# Patient Record
Sex: Female | Born: 1955
Health system: Southern US, Community
[De-identification: ages and names within clinical notes are randomized; demographics above are authoritative.]

## PROBLEM LIST (undated history)

## (undated) DIAGNOSIS — I10 Essential (primary) hypertension: Secondary | ICD-10-CM

## (undated) DIAGNOSIS — J45909 Unspecified asthma, uncomplicated: Secondary | ICD-10-CM

## (undated) DIAGNOSIS — M1990 Unspecified osteoarthritis, unspecified site: Secondary | ICD-10-CM

## (undated) DIAGNOSIS — I639 Cerebral infarction, unspecified: Secondary | ICD-10-CM

## (undated) DIAGNOSIS — I499 Cardiac arrhythmia, unspecified: Secondary | ICD-10-CM

---

## 1982-11-30 HISTORY — PX: TUBAL LIGATION: SHX77

## 2006-11-30 DIAGNOSIS — I639 Cerebral infarction, unspecified: Secondary | ICD-10-CM

## 2006-11-30 HISTORY — DX: Cerebral infarction, unspecified: I63.9

## 2013-11-30 DIAGNOSIS — I499 Cardiac arrhythmia, unspecified: Secondary | ICD-10-CM

## 2013-11-30 HISTORY — DX: Cardiac arrhythmia, unspecified: I49.9

## 2014-07-27 ENCOUNTER — Encounter (HOSPITAL_COMMUNITY): Payer: Self-pay | Admitting: Emergency Medicine

## 2014-07-27 ENCOUNTER — Emergency Department (HOSPITAL_COMMUNITY): Payer: Self-pay

## 2014-07-27 ENCOUNTER — Emergency Department (HOSPITAL_COMMUNITY)
Admission: EM | Admit: 2014-07-27 | Discharge: 2014-07-27 | Disposition: A | Discharge status: Home / Self Care | Payer: Self-pay | Attending: Emergency Medicine | Admitting: Emergency Medicine

## 2014-07-27 DIAGNOSIS — J069 Acute upper respiratory infection, unspecified: Secondary | ICD-10-CM | POA: Insufficient documentation

## 2014-07-27 DIAGNOSIS — Z8739 Personal history of other diseases of the musculoskeletal system and connective tissue: Secondary | ICD-10-CM | POA: Insufficient documentation

## 2014-07-27 DIAGNOSIS — I10 Essential (primary) hypertension: Secondary | ICD-10-CM | POA: Insufficient documentation

## 2014-07-27 DIAGNOSIS — R079 Chest pain, unspecified: Secondary | ICD-10-CM | POA: Insufficient documentation

## 2014-07-27 DIAGNOSIS — R0602 Shortness of breath: Secondary | ICD-10-CM | POA: Insufficient documentation

## 2014-07-27 HISTORY — DX: Essential (primary) hypertension: I10

## 2014-07-27 HISTORY — DX: Unspecified osteoarthritis, unspecified site: M19.90

## 2014-07-27 LAB — BASIC METABOLIC PANEL
Anion gap: 10 (ref 5–15)
BUN: 12 mg/dL (ref 6–23)
CHLORIDE: 103 meq/L (ref 96–112)
CO2: 27 meq/L (ref 19–32)
CREATININE: 0.66 mg/dL (ref 0.50–1.10)
Calcium: 8.8 mg/dL (ref 8.4–10.5)
GFR calc Af Amer: >90 mL/min (ref >90)
GFR calc non Af Amer: >90 mL/min (ref >90)
Glucose, Bld: 96 mg/dL (ref 70–99)
Potassium: 3.7 mEq/L (ref 3.7–5.3)
Sodium: 140 mEq/L (ref 137–147)

## 2014-07-27 LAB — CBC
HEMATOCRIT: 40.1 % (ref 36.0–46.0)
Hemoglobin: 13 g/dL (ref 12.0–15.0)
MCH: 27 pg (ref 26.0–34.0)
MCHC: 32.4 g/dL (ref 30.0–36.0)
MCV: 83.2 fL (ref 78.0–100.0)
Platelets: 259 10*3/uL (ref 150–400)
RBC: 4.82 MIL/uL (ref 3.87–5.11)
RDW: 14.1 % (ref 11.5–15.5)
WBC: 6.4 10*3/uL (ref 4.0–10.5)

## 2014-07-27 LAB — URINE MICROSCOPIC-ADD ON

## 2014-07-27 LAB — URINALYSIS, ROUTINE W REFLEX MICROSCOPIC
BILIRUBIN URINE: NEGATIVE
Glucose, UA: NEGATIVE mg/dL
HGB URINE DIPSTICK: NEGATIVE
Ketones, ur: NEGATIVE mg/dL
Nitrite: NEGATIVE
Protein, ur: NEGATIVE mg/dL
SPECIFIC GRAVITY, URINE: 1.025 (ref 1.005–1.030)
Urobilinogen, UA: 1 mg/dL (ref 0.0–1.0)
pH: 6 (ref 5.0–8.0)

## 2014-07-27 LAB — TROPONIN I: Troponin I: <0.3 ng/mL (ref <0.30)

## 2014-07-27 MED ORDER — SODIUM CHLORIDE 0.9 % IV SOLN
Freq: Once | INTRAVENOUS | Status: AC
  Administered 2014-07-27: 08:00:00 via INTRAVENOUS

## 2014-07-27 NOTE — Discharge Instructions (Signed)
Keep up your fluid intake at home. Continue using your amlodipine (blood pressure medication) as prescribed.  Upper Respiratory Infection, Adult An upper respiratory infection (URI) is also sometimes known as the common cold. The upper respiratory tract includes the nose, sinuses, throat, trachea, and bronchi. Bronchi are the airways leading to the lungs. Most people improve within 1 week, but symptoms can last up to 2 weeks. A residual cough may last even longer.  CAUSES Many different viruses can infect the tissues lining the upper respiratory tract. The tissues become irritated and inflamed and often become very moist. Mucus production is also common. A cold is contagious. You can easily spread the virus to others by oral contact. This includes kissing, sharing a glass, coughing, or sneezing. Touching your mouth or nose and then touching a surface, which is then touched by another person, can also spread the virus. SYMPTOMS  Symptoms typically develop 1 to 3 days after you come in contact with a cold virus. Symptoms vary from person to person. They may include:  Runny nose.  Sneezing.  Nasal congestion.  Sinus irritation.  Sore throat.  Loss of voice (laryngitis).  Cough.  Fatigue.  Muscle aches.  Loss of appetite.  Headache.  Low-grade fever. DIAGNOSIS  You might diagnose your own cold based on familiar symptoms, since most people get a cold 2 to 3 times a year. Your caregiver can confirm this based on your exam. Most importantly, your caregiver can check that your symptoms are not due to another disease such as strep throat, sinusitis, pneumonia, asthma, or epiglottitis. Blood tests, throat tests, and X-rays are not necessary to diagnose a common cold, but they may sometimes be helpful in excluding other more serious diseases. Your caregiver will decide if any further tests are required. RISKS AND COMPLICATIONS  You may be at risk for a more severe case of the common cold if  you smoke cigarettes, have chronic heart disease (such as heart failure) or lung disease (such as asthma), or if you have a weakened immune system. The very young and very old are also at risk for more serious infections. Bacterial sinusitis, middle ear infections, and bacterial pneumonia can complicate the common cold. The common cold can worsen asthma and chronic obstructive pulmonary disease (COPD). Sometimes, these complications can require emergency medical care and may be life-threatening. PREVENTION  The best way to protect against getting a cold is to practice good hygiene. Avoid oral or hand contact with people with cold symptoms. Wash your hands often if contact occurs. There is no clear evidence that vitamin C, vitamin E, echinacea, or exercise reduces the chance of developing a cold. However, it is always recommended to get plenty of rest and practice good nutrition. TREATMENT  Treatment is directed at relieving symptoms. There is no cure. Antibiotics are not effective, because the infection is caused by a virus, not by bacteria. Treatment may include:  Increased fluid intake. Sports drinks offer valuable electrolytes, sugars, and fluids.  Breathing heated mist or steam (vaporizer or shower).  Eating chicken soup or other clear broths, and maintaining good nutrition.  Getting plenty of rest.  Using gargles or lozenges for comfort.  Controlling fevers with ibuprofen or acetaminophen as directed by your caregiver.  Increasing usage of your inhaler if you have asthma. Zinc gel and zinc lozenges, taken in the first 24 hours of the common cold, can shorten the duration and lessen the severity of symptoms. Pain medicines may help with fever, muscle aches, and  throat pain. A variety of non-prescription medicines are available to treat congestion and runny nose. Your caregiver can make recommendations and may suggest nasal or lung inhalers for other symptoms.  HOME CARE INSTRUCTIONS   Only  take over-the-counter or prescription medicines for pain, discomfort, or fever as directed by your caregiver.  Use a warm mist humidifier or inhale steam from a shower to increase air moisture. This may keep secretions moist and make it easier to breathe.  Drink enough water and fluids to keep your urine clear or pale yellow.  Rest as needed.  Return to work when your temperature has returned to normal or as your caregiver advises. You may need to stay home longer to avoid infecting others. You can also use a face mask and careful hand washing to prevent spread of the virus. SEEK MEDICAL CARE IF:   After the first few days, you feel you are getting worse rather than better.  You need your caregiver's advice about medicines to control symptoms.  You develop chills, worsening shortness of breath, or brown or red sputum. These may be signs of pneumonia.  You develop yellow or brown nasal discharge or pain in the face, especially when you bend forward. These may be signs of sinusitis.  You develop a fever, swollen neck glands, pain with swallowing, or white areas in the back of your throat. These may be signs of strep throat. SEEK IMMEDIATE MEDICAL CARE IF:   You have a fever.  You develop severe or persistent headache, ear pain, sinus pain, or chest pain.  You develop wheezing, a prolonged cough, cough up blood, or have a change in your usual mucus (if you have chronic lung disease).  You develop sore muscles or a stiff neck. Document Released: 05/12/2001 Document Revised: 02/08/2012 Document Reviewed: 02/21/2014 [employer] [facility] Patient Information 2015 [place], [place]. This information is not intended to replace advice given to you by your health care provider. Make sure you discuss any questions you have with your health care provider.

## 2014-07-27 NOTE — ED Provider Notes (Signed)
CSN: [id number]     Arrival date & time 07/27/14  0654 History   First MD Initiated Contact with Patient 07/27/14 [phone]     Chief Complaint  Patient presents with  . Chest Pain  . Shortness of Breath   Patient is a 58 y.o. female presenting with chest pain and shortness of breath.  Chest Pain Associated symptoms: shortness of breath   Shortness of Breath Associated symptoms: chest pain    Stephanie Snyder is a 55 yo woman with a history of asthma, hypertension, ?heart arrythmia and ?clots who is presenting with congestion, chills, shortness of breath and headache for the past 5 days. She also has longstanding left chest pain brought on by stress that reaches 8/10 in intensity and is somewhat relieved by sitting up. She also reports a history of pneumonia, as recently as one year ago.  Past Medical History  Diagnosis Date  . Arthritis   . Hypertension    History reviewed. No pertinent past surgical history. History reviewed. No pertinent family history. History  Substance Use Topics  . Smoking status: Never Smoker   . Smokeless tobacco: Not on file  . Alcohol Use: No   OB History   Grav Para Term Preterm Abortions TAB SAB Ect Mult Living                 Review of Systems  Respiratory: Positive for shortness of breath.   Cardiovascular: Positive for chest pain.  General: no recent illness prior to this episode; reports pneumonia every year following URI Skin: no rashes or lesions HEENT: generalized headache, no vision or hearing changes  Cardiac: chest pain as described in HPI, history of palpitations Respiratory: shortness of breath as described in HPI GI: no changes in BM, no abdominal pain Urinary: no dysuria, hematuria, frequency Msk: longstanding right knee swelling and pain  Allergies  Review of patient's allergies indicates not on file.  Home Medications   Prior to Admission medications   Not on File  amlodipine (given yesterday) Albuterol inhaler (prescribed  yesterday)  BP 157/92  Pulse 88  Temp(Src) 97.7 F (36.5 C) (Oral)  Resp 17  Ht  (1.575 m)  Wt 62 lb (28.123 kg)  BMI 11.34 kg/m2  SpO2 98% Physical Exam Appearance: in NAD HEENT: congested, head is AT/[place]ber[place], moist mucous membranes, PERRL, EOMi Heart: tachycardic,  Lungs: CTAB, no wheezing Abdomen: BS+, soft, nontender  Musculoskeletal: right knee swelling Extremities: lower extremities otherwise nonedematous Neurologic: A&Ox3, sensation and strength intact throughout Skin: no rashes or lesions   ED Course  Procedures (including critical care time) Labs Review Labs Reviewed  URINALYSIS, ROUTINE W REFLEX MICROSCOPIC - Abnormal; Notable for the following:    Leukocytes, UA SMALL (*)    All other components within normal limits  URINE MICROSCOPIC-ADD ON - Abnormal; Notable for the following:    Squamous Epithelial / LPF FEW (*)    Bacteria, UA FEW (*)    All other components within normal limits  TROPONIN I  CBC  BASIC METABOLIC PANEL  troponin <0.3  Imaging Review Dg Chest 2 View  07/27/2014   CLINICAL DATA:  Left chest pain shortness of breath productive cough nasal congestion  EXAM: CHEST  2 VIEW  COMPARISON:  None.  FINDINGS: Heart size upper normal. Vascular pattern is normal. Lungs are clear except for minimal scarring or atelectasis in left lower lobe. No pleural effusions.  IMPRESSION: No active cardiopulmonary disease.   Electronically Signed   By: Esperanza Heir  M.D.   On: 07/27/2014 09:39     EKG Interpretation   Date/Time:  Friday July 27 2014 07:00:42 EDT Ventricular Rate:  101 PR Interval:  142 QRS Duration: 76 QT Interval:  372 QTC Calculation: 482 R Axis:   78 Text Interpretation:  Sinus tachycardia Minimal voltage criteria for LVH,  may be normal variant Borderline ECG Sinus tachycardia Left ventricular  hypertrophy Borderline ECG Confirmed by Gerhard Munch  MD ([id number]) on  07/27/2014 7:59:17 AM      MDM   Final diagnoses:  URI (upper  respiratory infection)    This 58 yo woman with congestion, shortness of breath, headache and chills for the past 5 days has unremarkable labs (including troponin) and an unremarkable EKG and chest xray. She likely is experiencing a URI. She was given slow infusion IVF and is feeling better. In fact, she requested discharge, since "her boyfriend is hungry". She will be discharged and encouraged to maintain good fluid intake at home.    Dionne Ano, MD 07/27/14 1113  Dionne Ano, MD 07/27/14 [phone]

## 2014-07-27 NOTE — ED Notes (Signed)
Pt here for chest pain, and hx of pna, sts feels the same. Also complaints of arthritis type complaints.

## 2014-07-27 NOTE — ED Notes (Signed)
Patient transported to X-ray 

## 2014-07-27 NOTE — ED Provider Notes (Signed)
This patient was seen in conjunction with the resident physician, Dr. Leatha Gilding.  The documentation accurately reflects the ED encounter.  On my exam, the patient was sitting upright, in no distress, speaking clearly.  Vital signs were unremarkable. His EKG showed ventricular rate 101, tachycardia, LVH, otherwise unremarkable. The patient presents here, and requested discharge.  The reassuring findings, this seemed reasonable.  Gerhard Munch, MD 07/27/14 [phone]

## 2014-07-31 NOTE — Discharge Planning (Signed)
[facility] Liaison was not able to see the pt, [facility] orange card information will be sent to the address provided. °

## 2014-09-07 ENCOUNTER — Encounter (HOSPITAL_COMMUNITY): Payer: Self-pay | Admitting: Emergency Medicine

## 2014-09-07 ENCOUNTER — Emergency Department (HOSPITAL_COMMUNITY): Payer: Self-pay

## 2014-09-07 ENCOUNTER — Emergency Department (HOSPITAL_COMMUNITY)
Admission: EM | Admit: 2014-09-07 | Discharge: 2014-09-07 | Disposition: A | Discharge status: Home / Self Care | Payer: Self-pay | Attending: Emergency Medicine | Admitting: Emergency Medicine

## 2014-09-07 DIAGNOSIS — Z79899 Other long term (current) drug therapy: Secondary | ICD-10-CM | POA: Insufficient documentation

## 2014-09-07 DIAGNOSIS — Z91040 Latex allergy status: Secondary | ICD-10-CM | POA: Insufficient documentation

## 2014-09-07 DIAGNOSIS — M6281 Muscle weakness (generalized): Secondary | ICD-10-CM | POA: Insufficient documentation

## 2014-09-07 DIAGNOSIS — R531 Weakness: Secondary | ICD-10-CM

## 2014-09-07 DIAGNOSIS — I10 Essential (primary) hypertension: Secondary | ICD-10-CM | POA: Insufficient documentation

## 2014-09-07 LAB — CBC WITH DIFFERENTIAL/PLATELET
BASOS ABS: 0 10*3/uL (ref 0.0–0.1)
Basophils Relative: 1 % (ref 0–1)
EOS ABS: 0.2 10*3/uL (ref 0.0–0.7)
Eosinophils Relative: 4 % (ref 0–5)
HCT: 37.7 % (ref 36.0–46.0)
Hemoglobin: 12.3 g/dL (ref 12.0–15.0)
LYMPHS ABS: 2 10*3/uL (ref 0.7–4.0)
Lymphocytes Relative: 30 % (ref 12–46)
MCH: 26.6 pg (ref 26.0–34.0)
MCHC: 32.6 g/dL (ref 30.0–36.0)
MCV: 81.4 fL (ref 78.0–100.0)
Monocytes Absolute: 0.7 10*3/uL (ref 0.1–1.0)
Monocytes Relative: 11 % (ref 3–12)
NEUTROS PCT: 54 % (ref 43–77)
Neutro Abs: 3.6 10*3/uL (ref 1.7–7.7)
Platelets: 272 10*3/uL (ref 150–400)
RBC: 4.63 MIL/uL (ref 3.87–5.11)
RDW: 14.7 % (ref 11.5–15.5)
WBC: 6.6 10*3/uL (ref 4.0–10.5)

## 2014-09-07 LAB — BASIC METABOLIC PANEL
Anion gap: 12 (ref 5–15)
BUN: 23 mg/dL (ref 6–23)
CALCIUM: 8.9 mg/dL (ref 8.4–10.5)
CO2: 25 mEq/L (ref 19–32)
Chloride: 105 mEq/L (ref 96–112)
Creatinine, Ser: 0.73 mg/dL (ref 0.50–1.10)
GLUCOSE: 105 mg/dL — AB (ref 70–99)
POTASSIUM: 4 meq/L (ref 3.7–5.3)
SODIUM: 142 meq/L (ref 137–147)

## 2014-09-07 LAB — URINALYSIS, ROUTINE W REFLEX MICROSCOPIC
Bilirubin Urine: NEGATIVE
Glucose, UA: NEGATIVE mg/dL
Hgb urine dipstick: NEGATIVE
KETONES UR: NEGATIVE mg/dL
NITRITE: NEGATIVE
Protein, ur: NEGATIVE mg/dL
Specific Gravity, Urine: 1.025 (ref 1.005–1.030)
UROBILINOGEN UA: 1 mg/dL (ref 0.0–1.0)
pH: 6 (ref 5.0–8.0)

## 2014-09-07 LAB — URINE MICROSCOPIC-ADD ON

## 2014-09-07 NOTE — ED Provider Notes (Signed)
CSN: [id number]     Arrival date & time 09/07/14  4098 History   First MD Initiated Contact with Patient 09/07/14 [phone]     Chief Complaint  Patient presents with  . Leg Pain     (Consider location/radiation/quality/duration/timing/severity/associated sxs/prior Treatment) HPI Comments: Patient with past history of arthritis, high blood pressure -- from a shelter with complaint of weakness. Patient states that she has some weakness in her legs at baseline, especially her right leg. For this she uses a cane to help her ambulate. She will also have a numb sensation in her hands and legs which is intermittent and has occurred in the past, more frequently as of late. It occurs on both sides of her body. Today while preparing to leave the shelter, patient became very weak over her entire body. She was assisted by staff. The symptoms lasted for a short period of time (seconds to minutes). Patient denies signs of stroke including: facial droop, slurred speech, aphasia, imbalance/trouble walking. She did c/o her usual bilateral extremity weakness and numbness. No fevers, URI symptoms, chest pain, shortness of breath, abdominal pain, nausea, vomiting, diarrhea, urinary symptoms. No trauma or injury. No history of stroke or TIA.   Patient is a 58 y.o. female presenting with leg pain. The history is provided by the patient, medical records and the spouse.  Leg Pain Associated symptoms: no back pain and no fever     Past Medical History  Diagnosis Date  . Arthritis   . Hypertension    History reviewed. No pertinent past surgical history. No family history on file. History  Substance Use Topics  . Smoking status: Never Smoker   . Smokeless tobacco: Not on file  . Alcohol Use: No   OB History   Grav Para Term Preterm Abortions TAB SAB Ect Mult Living                 Review of Systems  Constitutional: Negative for fever.  HENT: Negative for rhinorrhea and sore throat.   Eyes: Negative for  redness.  Respiratory: Negative for cough.   Cardiovascular: Negative for chest pain.  Gastrointestinal: Negative for nausea, vomiting, abdominal pain and diarrhea.  Genitourinary: Negative for dysuria.  Musculoskeletal: Positive for myalgias. Negative for back pain.  Skin: Negative for rash.  Neurological: Positive for weakness and numbness. Negative for dizziness, seizures, syncope, facial asymmetry, speech difficulty, light-headedness and headaches.    Allergies  Latex  Home Medications   Prior to Admission medications   Medication Sig Start Date End Date Taking? Authorizing Provider  amLODipine (NORVASC) 10 MG tablet Take 10 mg by mouth daily.   Yes Historical Provider, MD  hydrochlorothiazide (HYDRODIURIL) 25 MG tablet Take 25 mg by mouth daily.   Yes Historical Provider, MD  metoprolol tartrate (LOPRESSOR) 25 MG tablet Take 25 mg by mouth daily.   Yes Historical Provider, MD  albuterol (PROVENTIL HFA;VENTOLIN HFA) 108 (90 BASE) MCG/ACT inhaler Inhale 2 puffs into the lungs every 6 (six) hours as needed for wheezing or shortness of breath.    Historical Provider, MD   BP 136/76  Pulse 71  Temp(Src) 97.9 F (36.6 C) (Oral)  Resp 18  Ht 5\' 2"  (1.575 m)  Wt 160 lb (72.576 kg)  BMI 29.26 kg/m2  SpO2 99%  Physical Exam  Nursing note and vitals reviewed. Constitutional: She is oriented to person, place, and time. She appears well-developed and well-nourished.  HENT:  Head: Normocephalic and atraumatic.  Right Ear: Tympanic membrane, external ear  and ear canal normal.  Left Ear: Tympanic membrane, external ear and ear canal normal.  Nose: Nose normal.  Mouth/Throat: Uvula is midline, oropharynx is clear and moist and mucous membranes are normal.  Eyes: Conjunctivae, EOM and lids are normal. Pupils are equal, round, and reactive to light. Right eye exhibits no nystagmus. Left eye exhibits no nystagmus.  Neck: Normal range of motion. Neck supple.  Cardiovascular: Normal rate  and regular rhythm.   Pulmonary/Chest: Effort normal and breath sounds normal.  Abdominal: Soft. There is no tenderness.  Musculoskeletal:       Cervical back: She exhibits normal range of motion, no tenderness and no bony tenderness.  Neurological: She is alert and oriented to person, place, and time. She has normal strength and normal reflexes. No cranial nerve deficit or sensory deficit. She exhibits normal muscle tone. She displays a negative Romberg sign. Coordination and gait normal. GCS eye subscore is 4. GCS verbal subscore is 5. GCS motor subscore is 6.  Patient states that her bilateral hands and feet are completely numb. On sharp/dull testing, the patient yells out when sharp tested on ankles, feet, forearms, wrists, and toes. She has apparently normal sensation in all extremities. No truncal ataxia. She ambulates at her baseline.   Skin: Skin is warm and dry.  Psychiatric: She has a normal mood and affect.    ED Course  Procedures (including critical care time) Labs Review Labs Reviewed  BASIC METABOLIC PANEL - Abnormal; Notable for the following:    Glucose, Bld 105 (*)    All other components within normal limits  URINALYSIS, ROUTINE W REFLEX MICROSCOPIC - Abnormal; Notable for the following:    APPearance CLOUDY (*)    Leukocytes, UA TRACE (*)    All other components within normal limits  URINE MICROSCOPIC-ADD ON - Abnormal; Notable for the following:    Squamous Epithelial / LPF FEW (*)    Bacteria, UA FEW (*)    All other components within normal limits  CBC WITH DIFFERENTIAL    Imaging Review Ct Head Wo Contrast  09/07/2014   CLINICAL DATA:  Initial encounter for intermittent headaches  EXAM: CT HEAD WITHOUT CONTRAST  TECHNIQUE: Contiguous axial images were obtained from the base of the skull through the vertex without intravenous contrast.  COMPARISON:  None.  FINDINGS: There is no evidence for acute hemorrhage, hydrocephalus, mass lesion, or abnormal extra-axial  fluid collection. No definite CT evidence for acute infarction. Patchy low attenuation in the deep hemispheric and periventricular white matter is nonspecific, but likely reflects chronic microvascular ischemic demyelination. Subtle irregularity associated with the inner table of the high left frontoparietal skull. No definable mass lesion or associated parenchymal calcification.  The visualized paranasal sinuses and mastoid air cells are clear.  IMPRESSION: No acute intracranial abnormality.  Chronic microvascular ischemic demyelination.   Electronically Signed   By: Kennith CenterEric  Mansell M.D.   On: 09/07/2014 08:01     EKG Interpretation   Date/Time:  Friday September 07 2014 07:49:19 EDT Ventricular Rate:  65 PR Interval:  168 QRS Duration: 69 QT Interval:  430 QTC Calculation: 447 R Axis:   60 Text Interpretation:  Sinus rhythm Confirmed by Fayrene FearingJAMES  MD, MARK ([id number]) on  09/07/2014 8:06:38 AM      7:16 AM Patient seen and examined. Work-up initiated. I am not concerned for acute stroke given presentation and I doubt TIA. Exam is not consistent with patient's complaints.   Vital signs reviewed and are as follows: BP 136/76  Pulse 71  Temp(Src) 97.9 F (36.6 C) (Oral)  Resp 18  Ht 5\' 2"  (1.575 m)  Wt 160 lb (72.576 kg)  BMI 29.26 kg/m2  SpO2 99%  Patient's history, exam, and findings discussed with Dr. Fayrene FearingJames. Patient's exam is stable while in ED.   Patient and husband informed of all results.   Patient counseled to return if they have weakness in their arms or legs, slurred speech, trouble walking or talking, confusion, trouble with their balance, or if they have any other concerns. Patient verbalizes understanding and agrees with plan.   Encouraged PCP f/u.    MDM   Final diagnoses:  Generalized weakness   Patient with generalized weakness. As documented, her exam is not consistent with her complaints. Her EKG is unremarkable. CT head is negative for acute process. Labs  unremarkable. No neurological deficits on exam. She ambulates at baseline. No unilateral weakness. I do not suspect that this patient had a stroke. Furthermore, I doubt that her constellation of symptoms today represent TIA. Patient seems most concerned about needing a note so that she does not have to leave her shelter during the day. She is cleared to go home at this time. We discussed appropriate return instructions at bedside per above.     Renne CriglerJoshua Tehillah Cipriani, PA-C 09/07/14 1115

## 2014-09-07 NOTE — ED Notes (Signed)
Bed: WA06 Expected date:  Expected time:  Means of arrival:  Comments[facility]

## 2014-09-07 NOTE — ED Notes (Signed)
[facility]. Reports upon standing leg pain is "dull" 9/10 pain. Denies recent injury to same. Denies radiating pain.   VS BP 155/79, HR 86, 02 100% RA.

## 2014-09-07 NOTE — Discharge Instructions (Signed)
Please read and follow all provided instructions.  Your diagnoses today include:  1. Generalized weakness    Tests performed today include:  Blood counts and electrolytes  CT of your head - no signs of stroke  Urine test - no signs of infection  Vital signs. See below for your results today.   Medications prescribed:   None  Take any prescribed medications only as directed.  Home care instructions:  Follow any educational materials contained in this packet.  Follow-up instructions: Please follow-up with your primary care provider in the next 3 days for further evaluation of your symptoms. Contact the referral given if you do not have a primary care doctor.   Return instructions:   Please return to the Emergency Department if you experience worsening symptoms.  Return if you have worsening weakness in your arms or legs, slurred speech, trouble walking or talking, confusion, or trouble with your balance.   Please return if you have any other emergent concerns.  Additional Information:  Your vital signs today were: BP 136/76   Pulse 71   Temp(Src) 97.9 F (36.6 C) (Oral)   Resp 18   Ht 5\' 2"  (1.575 m)   Wt 160 lb (72.576 kg)   BMI 29.26 kg/m2   SpO2 99% If your blood pressure (BP) was elevated above 135/85 this visit, please have this repeated by your doctor within one month. --------------

## 2014-09-07 NOTE — ED Notes (Signed)
Patient reports intermittent numbness from bilateral feet to upper bilateral ankles for several months. Patient reports left hand numbness a few days ago. Patient reports right leg pressure, pain 9/10. Denies N/V, lightheadedness, dizziness.

## 2014-09-14 NOTE — ED Provider Notes (Signed)
Medical screening examination/treatment/procedure(s) were performed by non-physician practitioner and as supervising physician I was immediately available for consultation/collaboration.   EKG Interpretation   Date/Time:  Friday September 07 2014 07:49:19 EDT Ventricular Rate:  65 PR Interval:  168 QRS Duration: 69 QT Interval:  430 QTC Calculation: 447 R Axis:   60 Text Interpretation:  Sinus rhythm Confirmed by Fayrene FearingJAMES  MD, Lunna Vogelgesang ([id number]) on  09/07/2014 8:06:38 AM        Rolland PorterMark Vernessa Likes, MD 09/14/14 [phone]

## 2014-12-05 ENCOUNTER — Encounter (HOSPITAL_COMMUNITY): Payer: Self-pay | Admitting: *Deleted

## 2014-12-05 ENCOUNTER — Emergency Department (INDEPENDENT_AMBULATORY_CARE_PROVIDER_SITE_OTHER)
Admission: EM | Admit: 2014-12-05 | Discharge: 2014-12-05 | Disposition: A | Discharge status: Home / Self Care | Payer: Self-pay | Source: Home / Self Care | Attending: Family Medicine | Admitting: Family Medicine

## 2014-12-05 DIAGNOSIS — I10 Essential (primary) hypertension: Secondary | ICD-10-CM

## 2014-12-05 HISTORY — DX: Unspecified asthma, uncomplicated: J45.909

## 2014-12-05 HISTORY — DX: Cardiac arrhythmia, unspecified: I49.9

## 2014-12-05 HISTORY — DX: Cerebral infarction, unspecified: I63.9

## 2014-12-05 MED ORDER — AMLODIPINE BESYLATE 10 MG PO TABS: 10.0000 mg | ORAL_TABLET | Freq: Once | ORAL | Status: DC

## 2014-12-05 MED ORDER — METOPROLOL TARTRATE 50 MG PO TABS
50.0000 mg | ORAL_TABLET | Freq: Once | ORAL | Status: AC
  Administered 2014-12-05: 50 mg via ORAL
  Filled 2014-12-05: qty 1

## 2014-12-05 MED ORDER — AMLODIPINE BESYLATE 10 MG PO TABS
10.0000 mg | ORAL_TABLET | Freq: Once | ORAL | Status: AC
  Administered 2014-12-05: 10 mg via ORAL
  Filled 2014-12-05: qty 1

## 2014-12-05 MED ORDER — METOPROLOL TARTRATE 50 MG PO TABS: 50.0000 mg | ORAL_TABLET | Freq: Once | ORAL | Status: DC

## 2014-12-05 MED ORDER — HYDROCHLOROTHIAZIDE 25 MG PO TABS: 25.0000 mg | ORAL_TABLET | Freq: Every day | ORAL | Status: DC

## 2014-12-05 MED ORDER — AMLODIPINE BESYLATE 10 MG PO TABS: 10.0000 mg | ORAL_TABLET | Freq: Every day | ORAL | Status: DC

## 2014-12-05 MED ORDER — METOPROLOL TARTRATE 50 MG PO TABS: 50.0000 mg | ORAL_TABLET | Freq: Two times a day (BID) | ORAL | Status: DC

## 2014-12-05 NOTE — ED Notes (Signed)
Pt. asked for note to stay in the center during the day.  They are put out from 0900-1500 every day to walk.  Discussed with PA and she said she would do one day.  Note done by PA and given to pt.

## 2014-12-05 NOTE — ED Provider Notes (Signed)
CSN: [id number]     Arrival date & time 12/05/14  1726 History   First MD Initiated Contact with Patient 12/05/14 1743     Chief Complaint  Patient presents with  . Hypertension   (Consider location/radiation/quality/duration/timing/severity/associated sxs/prior Treatment) HPI Comments: Patient states she ran out of her antihypertension medications 2 weeks ago and comes to our clinic today to request prescription refills. Reports she is currently living in a shelter and clinic that she used to go to has closed and she is awaiting transition to clinic at [facility].  Reports that Lopressor dose recently increased to 50 mg po BID. Also takes HCTZ 25 mg po QD and Norvasc 10 mg po QD.   Patient is a 59 y.o. female presenting with hypertension. The history is provided by the patient.  Hypertension This is a chronic problem.    Past Medical History  Diagnosis Date  . Arthritis   . Hypertension   . Asthma   . CVA (cerebral vascular accident) 2008  . Irregular heart beat 2015   Past Surgical History  Procedure Laterality Date  . Tubal ligation  1984   Family History  Problem Relation Age of Onset  . Cancer Father    History  Substance Use Topics  . Smoking status: Never Smoker   . Smokeless tobacco: Not on file  . Alcohol Use: No   OB History    No data available     Review of Systems  All other systems reviewed and are negative.   Allergies  Latex  Home Medications   Prior to Admission medications   Medication Sig Start Date End Date Taking? Authorizing Provider  albuterol (PROVENTIL HFA;VENTOLIN HFA) 108 (90 BASE) MCG/ACT inhaler Inhale 2 puffs into the lungs every 6 (six) hours as needed for wheezing or shortness of breath.   Yes Historical Provider, MD  amLODipine (NORVASC) 10 MG tablet Take 1 tablet (10 mg total) by mouth daily. 12/05/14   Mathis Fare Oma Alpert, PA  hydrochlorothiazide (HYDRODIURIL) 25 MG tablet Take 1 tablet (25 mg total) by  mouth daily. 12/05/14   Mathis Fare Shawny Borkowski, PA  metoprolol (LOPRESSOR) 50 MG tablet Take 1 tablet (50 mg total) by mouth 2 (two) times daily. 12/05/14   Jess Barters H Anagha Loseke, PA   BP 220/124 mmHg  Pulse 75  Temp(Src) 98.4 F (36.9 C) (Oral)  Resp 18  SpO2 98% Physical Exam  Constitutional: She is oriented to person, place, and time. She appears well-developed and well-nourished. No distress.  +overweight  HENT:  Head: Normocephalic and atraumatic.  Eyes: Conjunctivae are normal. No scleral icterus.  Cardiovascular: Normal rate, regular rhythm and normal heart sounds.   Pulmonary/Chest: Effort normal and breath sounds normal.  Musculoskeletal: Normal range of motion.  Neurological: She is alert and oriented to person, place, and time.  Skin: Skin is warm and dry.  Psychiatric: She has a normal mood and affect. Her behavior is normal.  Nursing note and vitals reviewed.   ED Course  Procedures (including critical care time) Labs Review Labs Reviewed - No data to display  Imaging Review No results found.   MDM   1. Essential hypertension    Contacted RN at shelter and she stated she would transport patient to have medications filled tomorrow morning. As patient's BP is currently elevated, I contacted [facility] and ordered 50 mg dose of lopressor and 10 mg dose of Norvasc to take tonight prior to discharge from  [facility] clinic. Courier to pick up meds and we will administer prior to discharge.    Ria ClockJennifer Lee H Jaala Bohle, [place] 12/05/14 [phone]

## 2014-12-05 NOTE — ED Notes (Addendum)
Homeless and [facility].  Her husband is veteran and they are trying to get home.  Pt. Has been out of BP meds  2 weeks.  No PCP.  Also c/o pain and swelling to R knee.  C/o numbness and tingling in R thigh when she walks.

## 2014-12-05 NOTE — Discharge Instructions (Signed)

## 2014-12-05 NOTE — ED Notes (Signed)
Pharmacy called to get the first 2 doses of her BP medication because she can't get the Rx.'s filled until tomorrow.  Pt. to wait in waiting room for medication.

## 2016-02-14 ENCOUNTER — Ambulatory Visit (INDEPENDENT_AMBULATORY_CARE_PROVIDER_SITE_OTHER): Payer: Medicaid Other | Admitting: Internal Medicine

## 2016-02-14 ENCOUNTER — Encounter: Payer: Self-pay | Admitting: Internal Medicine

## 2016-02-14 ENCOUNTER — Telehealth: Payer: Self-pay | Admitting: *Deleted

## 2016-02-14 VITALS — BP 186/106 | HR 81 | Temp 97.4°F | Ht 60.0 in | Wt 170.8 lb

## 2016-02-14 DIAGNOSIS — I10 Essential (primary) hypertension: Secondary | ICD-10-CM | POA: Insufficient documentation

## 2016-02-14 DIAGNOSIS — J45909 Unspecified asthma, uncomplicated: Secondary | ICD-10-CM

## 2016-02-14 MED ORDER — LISINOPRIL 10 MG PO TABS: 10.0000 mg | ORAL_TABLET | Freq: Every day | ORAL | Status: DC

## 2016-02-14 MED ORDER — CETIRIZINE HCL 5 MG PO TABS: 5.0000 mg | ORAL_TABLET | Freq: Every day | ORAL | Status: DC

## 2016-02-14 MED ORDER — ALBUTEROL SULFATE HFA 108 (90 BASE) MCG/ACT IN AERS: 2.0000 | INHALATION_SPRAY | Freq: Four times a day (QID) | RESPIRATORY_TRACT | Status: DC | PRN

## 2016-02-14 NOTE — Assessment & Plan Note (Signed)
BP elevated in setting of not taking Clonidine for 2-3 days.  She is currently on Amlodipine, HCTZ and Metoprolol.   A/P: HTN requiring 4 drugs.  Will stop Clonidine and start Lisinopril.  Recheck BP in 2 weeks.  Investigation of secondary causes of HTN should occur when patient not in acute asthma exacerbation. - BMP - Continue Amlodipine, HCTZ, and Metoprolol - Lisinopril 10 mg daily - RTC 2 weeks

## 2016-02-14 NOTE — Patient Instructions (Signed)
1. STOP taking Clonidine. 2. Start Lisinopril 10 mg (1 tab) once daily. 3. Use Albuterol 2 puffs every 4 hours as needed shortness of breath.  Use with spacer. 4. Return to clinic in 2 weeks.

## 2016-02-14 NOTE — Progress Notes (Signed)
Patient ID: Stephanie Snyder, female   DOB: 02/19/1956, 60 y.o.   MRN: [id number]   Subjective:   Patient ID: Stephanie Snyder female   DOB: 07/18/1956 60 y.o.   MRN: [id number]  HPI: [name] is a 60 y.o. female with PMH as below, here for eval asthma and HTN.  Please see Problem-Based charting for the status of the patient's chronic medical issues.  Patient reports h/o asthma, diagnosed 6 months ago. She states she had PFTs done for disability paperwork, but cannot recall the doctor or facility where it was performed.  She is now complaining of dyspnea every night and nonproductive cough.  She is using albuterol inhaler twice daily with minimal relief.  She denies h/o allergies, but states the dry air from her furnace causes her to get SOB.  She endorses intermittent fever and chills, stating she is always hot.  She currently has runny nose and congestion for the last week.   She endorses left sided chest pain and SOB with exertion, relieved with rest.  She has never seen a cardiologist.  She is currently chest pain free.  Her BP is high today because she hasn't taken Clonidine in 2-3 days.   Past Medical History  Diagnosis Date  . Arthritis   . Hypertension   . Asthma   . CVA (cerebral vascular accident) 2008  . Irregular heart beat 2015   Current Outpatient Prescriptions  Medication Sig Dispense Refill  . albuterol (PROVENTIL HFA;VENTOLIN HFA) 108 (90 Base) MCG/ACT inhaler Inhale 2 puffs into the lungs every 6 (six) hours as needed for wheezing or shortness of breath. 18 g 3  . amLODipine (NORVASC) 10 MG tablet Take 1 tablet (10 mg total) by mouth daily. 30 tablet 1  . cetirizine (ZYRTEC) 5 MG tablet Take 1 tablet (5 mg total) by mouth daily. 30 tablet 3  . hydrochlorothiazide (HYDRODIURIL) 25 MG tablet Take 1 tablet (25 mg total) by mouth daily. 30 tablet 1  . lisinopril (PRINIVIL,ZESTRIL) 10 MG tablet Take 1 tablet (10 mg total) by mouth daily. 30 tablet 3  . metoprolol  (LOPRESSOR) 50 MG tablet Take 1 tablet (50 mg total) by mouth 2 (two) times daily. 60 tablet 1   No current facility-administered medications for this visit.   Family History  Problem Relation Age of Onset  . Cancer Father    Social History   Social History  . Marital Status: Married    Spouse Name: N/A  . Number of Children: N/A  . Years of Education: N/A   Social History Main Topics  . Smoking status: Never Smoker   . Smokeless tobacco: Not on file  . Alcohol Use: No  . Drug Use: No  . Sexual Activity: Not on file   Other Topics Concern  . Not on file   Social History Narrative   Review of Systems: Pertinent items are noted in HPI. Objective:  Physical Exam: Filed Vitals:   02/14/16 1546  BP: 186/106  Pulse: 81  Temp: 97.4 F (36.3 C)  TempSrc: Oral  Height: 5' (1.524 m)  Weight: 170 lb 12.8 oz (77.474 kg)  SpO2: 98%   Physical Exam  Constitutional: She is oriented to person, place, and time and well-developed, well-nourished, and in no distress. No distress.  HENT:  Head: Normocephalic and atraumatic.  Eyes: EOM are normal. No scleral icterus.  Neck: No JVD present. No tracheal deviation present.  Cardiovascular: Normal rate, regular rhythm and normal heart sounds.   Pulmonary/Chest: Effort normal.  No stridor. No respiratory distress.  Minimal scattered wheezing  Abdominal: Soft. She exhibits no distension. There is no tenderness. There is no rebound and no guarding.  Neurological: She is alert and oriented to person, place, and time.  Skin: Skin is warm and dry. She is not diaphoretic.     Assessment & Plan:   Patient and case were discussed with Dr. Rogelia Boga.  Please refer to Problem Based charting for further documentation.

## 2016-02-14 NOTE — Assessment & Plan Note (Signed)
A/P: Presumed asthma exacerbation; patient likely mild persistent to moderate persistent.  Without PFTs to judge degree of constriction, I am hesitant to add more asthma control medications.  Acute symptoms due to either allergies from old house or URI.  Will refill Albuterol and increase frequency, and start Zyrtec for allergy control.  Recheck patient in 2 weeks and order PFTs at that time. - Albuterol 2 puffs q4h PRN - Zyrtec 5 mg daily

## 2016-02-15 LAB — BMP8+ANION GAP
ANION GAP: 17 mmol/L (ref 10.0–18.0)
BUN/Creatinine Ratio: 23 (ref 11–26)
BUN: 16 mg/dL (ref 8–27)
CALCIUM: 9.2 mg/dL (ref 8.7–10.3)
CO2: 21 mmol/L (ref 18–29)
CREATININE: 0.69 mg/dL (ref 0.57–1.00)
Chloride: 103 mmol/L (ref 96–106)
GFR calc Af Amer: 109 mL/min/{1.73_m2} (ref >59)
GFR, EST NON AFRICAN AMERICAN: 94 mL/min/{1.73_m2} (ref >59)
Glucose: 92 mg/dL (ref 65–99)
POTASSIUM: 3.7 mmol/L (ref 3.5–5.2)
Sodium: 141 mmol/L (ref 134–144)

## 2016-02-17 NOTE — Progress Notes (Signed)
Internal Medicine Clinic Attending  Case discussed with Dr. Taylor at the time of the visit.  We reviewed the resident's history and exam and pertinent patient test results.  I agree with the assessment, diagnosis, and plan of care documented in the resident's note. 

## 2016-02-19 NOTE — Addendum Note (Signed)
Addended by: Venia MinksAYLOR, Argenis Kumari A on: 02/19/2016 01:59 PM   Modules accepted: Level of Service

## 2016-02-28 ENCOUNTER — Ambulatory Visit (INDEPENDENT_AMBULATORY_CARE_PROVIDER_SITE_OTHER): Payer: Medicaid Other | Admitting: Internal Medicine

## 2016-02-28 ENCOUNTER — Encounter: Payer: Self-pay | Admitting: Internal Medicine

## 2016-02-28 VITALS — BP 140/74 | HR 67 | Temp 97.7°F | Ht 60.0 in | Wt 171.3 lb

## 2016-02-28 DIAGNOSIS — M25571 Pain in right ankle and joints of right foot: Secondary | ICD-10-CM

## 2016-02-28 DIAGNOSIS — Z79899 Other long term (current) drug therapy: Secondary | ICD-10-CM

## 2016-02-28 DIAGNOSIS — M79661 Pain in right lower leg: Secondary | ICD-10-CM

## 2016-02-28 DIAGNOSIS — I10 Essential (primary) hypertension: Secondary | ICD-10-CM | POA: Diagnosis not present

## 2016-02-28 DIAGNOSIS — Z7951 Long term (current) use of inhaled steroids: Secondary | ICD-10-CM | POA: Diagnosis not present

## 2016-02-28 DIAGNOSIS — M25561 Pain in right knee: Secondary | ICD-10-CM | POA: Diagnosis not present

## 2016-02-28 DIAGNOSIS — J4520 Mild intermittent asthma, uncomplicated: Secondary | ICD-10-CM | POA: Diagnosis not present

## 2016-02-28 NOTE — Assessment & Plan Note (Signed)
Patient reports jumping out of an attic 2 years ago, landing on her feet and crumpling to the ground.  She did not seek medical attention at that time.  Since then, she has been having slowly worsening right leg pain, with sharp, shooting pains proceeding from ankle up the lateral and posterior right leg.  Her knee will sometimes swell as well.  She has previously taken Naproxen with good results.  A/P: Likely OA in the setting of previous trauma.  Will proceed with Xrays for formal diagnosis and to r/o occult fracture.  - Right ankle, knee, hip Xrays - Aleve 1-2 tabs BID PRN.

## 2016-02-28 NOTE — Assessment & Plan Note (Signed)
She has improved since last visit.  She is using Albuterol once daily.  She awakens once per night when she has the covers over her head because she is too cold.  She is able to perform her daily activities.  A/P: Mild intermittent asthma.  No PFTs in our files, so will order now.  In the meantime, she appears well controlled on Albuterol alone. - Albuterol PRN - PFTs

## 2016-02-28 NOTE — Assessment & Plan Note (Addendum)
BP Readings from Last 3 Encounters:  02/28/16 140/74  02/14/16 186/106  12/05/14 220/124    Lab Results  Component Value Date   NA 141 02/14/2016   K 3.7 02/14/2016   CREATININE 0.69 02/14/2016    Assessment: Blood pressure control:  fair Progress toward BP goal:   improved Comments: Intermittent dizziness not a/w orthostasis, but stress or not eating  Plan: Medications:  continue current medications, Lisinopril, Amlodipine, HCTZ, and Metoprolol Educational resources provided:   Self management tools provided:   Other plans: BMP, recheck BP in 3 months

## 2016-02-28 NOTE — Progress Notes (Signed)
Patient ID: Stephanie Snyder, female   DOB: 12/19/1955, 60 y.o.   MRN: [id number]   Subjective:   Patient ID: Stephanie Snyder female   DOB: 06/20/1956 60 y.o.   MRN: [id number]  HPI: [name] is a 60 y.o. female with PMH as below, here for f/u HTN and asthma.  Please see Problem-Based charting for the status of the patient's chronic medical issues.           Past Medical History  Diagnosis Date  . Arthritis   . Hypertension   . Asthma   . CVA (cerebral vascular accident) ([facility]CC) 2008  . Irregular heart beat 2015   Current Outpatient Prescriptions  Medication Sig Dispense Refill  . albuterol (PROVENTIL HFA;VENTOLIN HFA) 108 (90 Base) MCG/ACT inhaler Inhale 2 puffs into the lungs every 6 (six) hours as needed for wheezing or shortness of breath. 18 g 3  . amLODipine (NORVASC) 10 MG tablet Take 1 tablet (10 mg total) by mouth daily. 30 tablet 1  . cetirizine (ZYRTEC) 5 MG tablet Take 1 tablet (5 mg total) by mouth daily. 30 tablet 3  . hydrochlorothiazide (HYDRODIURIL) 25 MG tablet Take 1 tablet (25 mg total) by mouth daily. 30 tablet 1  . lisinopril (PRINIVIL,ZESTRIL) 10 MG tablet Take 1 tablet (10 mg total) by mouth daily. 30 tablet 3  . metoprolol (LOPRESSOR) 50 MG tablet Take 1 tablet (50 mg total) by mouth 2 (two) times daily. 60 tablet 1   No current facility-administered medications for this visit.   Family History  Problem Relation Age of Onset  . Cancer Father    Social History   Social History  . Marital Status: Married    Spouse Name: N/A  . Number of Children: N/A  . Years of Education: N/A   Social History Main Topics  . Smoking status: Never Smoker   . Smokeless tobacco: None  . Alcohol Use: No  . Drug Use: No  . Sexual Activity: Not Asked   Other Topics Concern  . None   Social History Narrative   Review of Systems: Patient denies fever, chills, HA, CP, or SOB. Objective:  Physical Exam: Filed Vitals:   02/28/16 1329  BP: 140/74  Pulse:  67  Temp: 97.7 F (36.5 C)  TempSrc: Oral  Height: 5' (1.524 m)  Weight: 171 lb 4.8 oz (77.701 kg)  SpO2: 100%   Physical Exam  Constitutional: She is oriented to person, place, and time and well-developed, well-nourished, and in no distress. No distress.  HENT:  Head: Normocephalic and atraumatic.  Eyes: EOM are normal. No scleral icterus.  Neck: No tracheal deviation present.  Cardiovascular: Normal rate, regular rhythm and normal heart sounds.   Pulmonary/Chest: Effort normal and breath sounds normal. No stridor. No respiratory distress. She has no wheezes.  No wheezes.  Musculoskeletal:  Strength 5/5 in bilateral LE.  Pain with dorsiflexion of right ankle, flexion of right knee, and flexion and internal rotation of right hip.  Neurological: She is alert and oriented to person, place, and time.  Skin: Skin is warm and dry. She is not diaphoretic.     Assessment & Plan:   Patient and case were discussed with Dr. Rogelia BogaButcher.  Please refer to Problem Based charting for further documentation.

## 2016-02-28 NOTE — Patient Instructions (Signed)
1. Continue Albuterol every 6 hours as needed. 2. Follow up for your Pulmonary Function Tests. 3. Continue Lisinopril and your other blood pressure medications. 4. You may take Aleve 1-2 tabs twice daily as needed for pain.  Naproxen and naproxen sodium oral immediate-release tablets What is this medicine? NAPROXEN (na PROX en) is a non-steroidal anti-inflammatory drug (NSAID). It is used to reduce swelling and to treat pain. This medicine may be used for dental pain, headache, or painful monthly periods. It is also used for painful joint and muscular problems such as arthritis, tendinitis, bursitis, and gout. This medicine may be used for other purposes; ask your health care provider or pharmacist if you have questions. What should I tell my health care provider before I take this medicine? They need to know if you have any of these conditions: -asthma -cigarette smoker -drink more than 3 alcohol containing drinks a day -heart disease or circulation problems such as heart failure or leg edema (fluid retention) -high blood pressure -kidney disease -liver disease -stomach bleeding or ulcers -an unusual or allergic reaction to naproxen, aspirin, other NSAIDs, other medicines, foods, dyes, or preservatives -pregnant or trying to get pregnant -breast-feeding How should I use this medicine? Take this medicine by mouth with a glass of water. Follow the directions on the prescription label. Take it with food if your stomach gets upset. Try to not lie down for at least 10 minutes after you take it. Take your medicine at regular intervals. Do not take your medicine more often than directed. Long-term, continuous use may increase the risk of heart attack or stroke. A special MedGuide will be given to you by the pharmacist with each prescription and refill. Be sure to read this information carefully each time. Talk to your pediatrician regarding the use of this medicine in children. Special care may be  needed. Overdosage: If you think you have taken too much of this medicine contact a poison control center or emergency room at once. NOTE: This medicine is only for you. Do not share this medicine with others. What if I miss a dose? If you miss a dose, take it as soon as you can. If it is almost time for your next dose, take only that dose. Do not take double or extra doses. What may interact with this medicine? -alcohol -aspirin -cidofovir -diuretics -lithium -methotrexate -other drugs for inflammation like ketorolac or prednisone -pemetrexed -probenecid -warfarin This list may not describe all possible interactions. Give your health care provider a list of all the medicines, herbs, non-prescription drugs, or dietary supplements you use. Also tell them if you smoke, drink alcohol, or use illegal drugs. Some items may interact with your medicine. What should I watch for while using this medicine? Tell your doctor or health care professional if your pain does not get better. Talk to your doctor before taking another medicine for pain. Do not treat yourself. This medicine does not prevent heart attack or stroke. In fact, this medicine may increase the chance of a heart attack or stroke. The chance may increase with longer use of this medicine and in people who have heart disease. If you take aspirin to prevent heart attack or stroke, talk with your doctor or health care professional. Do not take other medicines that contain aspirin, ibuprofen, or naproxen with this medicine. Side effects such as stomach upset, nausea, or ulcers may be more likely to occur. Many medicines available without a prescription should not be taken with this medicine. This  medicine can cause ulcers and bleeding in the stomach and intestines at any time during treatment. Do not smoke cigarettes or drink alcohol. These increase irritation to your stomach and can make it more susceptible to damage from this medicine. Ulcers  and bleeding can happen without warning symptoms and can cause death. You may get drowsy or dizzy. Do not drive, use machinery, or do anything that needs mental alertness until you know how this medicine affects you. Do not stand or sit up quickly, especially if you are an older patient. This reduces the risk of dizzy or fainting spells. This medicine can cause you to bleed more easily. Try to avoid damage to your teeth and gums when you brush or floss your teeth. What side effects may I notice from receiving this medicine? Side effects that you should report to your doctor or health care professional as soon as possible: -black or bloody stools, blood in the urine or vomit -blurred vision -chest pain -difficulty breathing or wheezing -nausea or vomiting -severe stomach pain -skin rash, skin redness, blistering or peeling skin, hives, or itching -slurred speech or weakness on one side of the body -swelling of eyelids, throat, lips -unexplained weight gain or swelling -unusually weak or tired -yellowing of eyes or skin Side effects that usually do not require medical attention (report to your doctor or health care professional if they continue or are bothersome): -constipation -headache -heartburn This list may not describe all possible side effects. Call your doctor for medical advice about side effects. You may report side effects to FDA at 1-800-FDA-1088. Where should I keep my medicine? Keep out of the reach of children. Store at room temperature between 15 and 30 degrees C (59 and 86 degrees F). Keep container tightly closed. Throw away any unused medicine after the expiration date. NOTE: This sheet is a summary. It may not cover all possible information. If you have questions about this medicine, talk to your doctor, pharmacist, or health care provider.    2016, Elsevier/Gold Standard. (2009-11-18 20:10:16)

## 2016-02-29 LAB — BMP8+ANION GAP
Anion Gap: 20 mmol/L — ABNORMAL HIGH (ref 10.0–18.0)
BUN / CREAT RATIO: 23 (ref 11–26)
BUN: 19 mg/dL (ref 8–27)
CO2: 20 mmol/L (ref 18–29)
Calcium: 9.3 mg/dL (ref 8.7–10.3)
Chloride: 104 mmol/L (ref 96–106)
Creatinine, Ser: 0.84 mg/dL (ref 0.57–1.00)
GFR calc Af Amer: 87 mL/min/{1.73_m2} (ref >59)
GFR calc non Af Amer: 75 mL/min/{1.73_m2} (ref >59)
GLUCOSE: 91 mg/dL (ref 65–99)
Potassium: 4.2 mmol/L (ref 3.5–5.2)
Sodium: 144 mmol/L (ref 134–144)

## 2016-03-03 NOTE — Progress Notes (Signed)
Internal Medicine Clinic Attending  Case discussed with Dr. Taylor at the time of the visit.  We reviewed the resident's history and exam and pertinent patient test results.  I agree with the assessment, diagnosis, and plan of care documented in the resident's note. 

## 2016-05-15 ENCOUNTER — Ambulatory Visit (HOSPITAL_COMMUNITY)
Admission: RE | Admit: 2016-05-15 | Discharge: 2016-05-15 | Disposition: A | Discharge status: Home / Self Care | Payer: Medicaid Other | Source: Ambulatory Visit | Attending: Internal Medicine | Admitting: Internal Medicine

## 2016-05-15 ENCOUNTER — Ambulatory Visit (INDEPENDENT_AMBULATORY_CARE_PROVIDER_SITE_OTHER): Payer: Medicaid Other | Admitting: Internal Medicine

## 2016-05-15 ENCOUNTER — Encounter: Payer: Self-pay | Admitting: Internal Medicine

## 2016-05-15 VITALS — BP 174/97 | HR 71 | Temp 98.2°F | Wt 182.2 lb

## 2016-05-15 DIAGNOSIS — M5416 Radiculopathy, lumbar region: Secondary | ICD-10-CM | POA: Diagnosis not present

## 2016-05-15 DIAGNOSIS — M25561 Pain in right knee: Secondary | ICD-10-CM

## 2016-05-15 DIAGNOSIS — M8580 Other specified disorders of bone density and structure, unspecified site: Secondary | ICD-10-CM | POA: Insufficient documentation

## 2016-05-15 DIAGNOSIS — I10 Essential (primary) hypertension: Secondary | ICD-10-CM

## 2016-05-15 DIAGNOSIS — M7731 Calcaneal spur, right foot: Secondary | ICD-10-CM | POA: Insufficient documentation

## 2016-05-15 DIAGNOSIS — Z1231 Encounter for screening mammogram for malignant neoplasm of breast: Secondary | ICD-10-CM | POA: Insufficient documentation

## 2016-05-15 MED ORDER — AMLODIPINE BESYLATE 10 MG PO TABS: 10.0000 mg | ORAL_TABLET | Freq: Every day | ORAL | Status: DC

## 2016-05-15 MED ORDER — HYDROCHLOROTHIAZIDE 25 MG PO TABS: 25.0000 mg | ORAL_TABLET | Freq: Every day | ORAL | Status: DC

## 2016-05-15 MED ORDER — LISINOPRIL 10 MG PO TABS: 10.0000 mg | ORAL_TABLET | Freq: Every day | ORAL | Status: DC

## 2016-05-15 MED ORDER — METOPROLOL TARTRATE 50 MG PO TABS: 50.0000 mg | ORAL_TABLET | Freq: Two times a day (BID) | ORAL | Status: DC

## 2016-05-15 NOTE — Assessment & Plan Note (Signed)
BP elevated today, previously on 4 different medications. Looks like her BP has been as high as 220's systolic at previous visits. Says she has not taken her medications for a few months now. No symptoms currently. Says she has not had refills.  -Restart HCTZ 25 mg daily + Lisinopril 10 mg daily + Norvasc 10 mg daily given her previously VERY high BP's -Hold Metoprolol 50 mg bid for now until patient returns for follow up.  -Previous labs checked at most recent clinic visit, BMP normal

## 2016-05-15 NOTE — Assessment & Plan Note (Signed)
Patient with right knee pain and some intermittent swelling, typically towards the end of the day. Has noticed a popping sensation as well that is painful. Also says she has pain in other parts of her leg, but suspect this is more related to lumbar radiculopathy rather than a hip pathology. Hip joints are stable on exam. The right knee does have positive patellar grind, pain with active and passive ROM, and popping with passive extension. Other knee tests negative. XR shows pretty significant OA, the likely etiology for her pain.  -Conservative management for now; ice, compression, Tylenol prn. Does not want to take NSAIDS, says these upset her stomach.  -Referral to PT -Can consider joint injection at follow up visit if this continues to cause her discomfort.

## 2016-05-15 NOTE — Patient Instructions (Signed)
1. Please schedule a follow up appointment for 2-3 weeks.   2. Please take all medications as previously prescribed with the following changes:  Restart Norvasc 10 mg daily and HCTZ 25 mg daily.   Go upstairs for your knee XR.   Continue to take Tylenol for your pain.   I have placed a referral for a mammo and for PT.   3. If you have worsening of your symptoms or new symptoms arise, please call the clinic [phone]), or go to the ER immediately if symptoms are severe.

## 2016-05-15 NOTE — Assessment & Plan Note (Signed)
Referral for mammo

## 2016-05-15 NOTE — Assessment & Plan Note (Signed)
Patient with symptoms suggestive of lumbar radiculopathy at this time. Says her pain extends into her thighs. Says this pain is typically moderately controlled with Tylenol alone. Denies any alarm symptoms.  -Continue conservative management -No need for imaging at this time given her exam -PT -Tylenol prn -May benefit from Neurontin vs referral to sports medicine in the future if this continues to be an issue

## 2016-05-15 NOTE — Progress Notes (Signed)
Subjective:   Patient ID: Stephanie Snyder female   DOB: [birth year] 60 y.o.   MRN: [id number]  HPI: [name] is a 60 y.o. female w/ PMHx of HTN, arthritis, asthma, and h/o CVA, presents to the clinic today for an acute visit for back pain and right knee pain. She states that both of these things have been causing her trouble for almost five years, after she states she had a work related injury. She now walks with a cane. She states her pain in her back is generally dull in nature, but sometimes radiates down her right leg. Her right knee sometimes swells and causes her discomfort with ambulation, typically at the end of the day and sometimes pops which she says is painful. She takes Tylenol prn for pain and says that this helps moderately for both. No LE weakness, incontinence, or saddle anesthesia.   She is also being seen for follow up for her BP. She states she has not taken her BP medications in several months as she says she has not had refills. She was previously taking Norvasc 10 mg daily, Lisinopril 10 mg daily, Metoprolol 50 mg bid, and HCTZ 25 mg daily. She has not been taking any of these.   Past Medical History  Diagnosis Date  . Arthritis   . Hypertension   . Asthma   . CVA (cerebral vascular accident) (HCC) 2008  . Irregular heart beat 2015   Current Outpatient Prescriptions  Medication Sig Dispense Refill  . albuterol (PROVENTIL HFA;VENTOLIN HFA) 108 (90 Base) MCG/ACT inhaler Inhale 2 puffs into the lungs every 6 (six) hours as needed for wheezing or shortness of breath. 18 g 3  . amLODipine (NORVASC) 10 MG tablet Take 1 tablet (10 mg total) by mouth daily. 30 tablet 1  . cetirizine (ZYRTEC) 5 MG tablet Take 1 tablet (5 mg total) by mouth daily. 30 tablet 3  . hydrochlorothiazide (HYDRODIURIL) 25 MG tablet Take 1 tablet (25 mg total) by mouth daily. 30 tablet 1  . lisinopril (PRINIVIL,ZESTRIL) 10 MG tablet Take 1 tablet (10 mg total) by mouth daily. 30 tablet 3  .  metoprolol (LOPRESSOR) 50 MG tablet Take 1 tablet (50 mg total) by mouth 2 (two) times daily. 60 tablet 1   No current facility-administered medications for this visit.    Review of Systems: General: Denies fever, chills, diaphoresis, appetite change and fatigue.  Respiratory: Denies SOB, DOE, cough, and wheezing.   Cardiovascular: Denies chest pain and palpitations.  Gastrointestinal: Denies nausea, vomiting, abdominal pain, and diarrhea.  Genitourinary: Denies dysuria, increased frequency, and flank pain. Endocrine: Denies hot or cold intolerance, polyuria, and polydipsia. Musculoskeletal: Positive for back pain and right knee pain/swelling. Denies myalgias, and gait problem.  Skin: Denies pallor, rash and wounds.  Neurological: Denies dizziness, seizures, syncope, weakness, lightheadedness, numbness and headaches.  Psychiatric/Behavioral: Denies mood changes, and sleep disturbances.  Objective:   Physical Exam: Filed Vitals:   05/15/16 0942  BP: 174/97  Pulse: 71  Temp: 98.2 F (36.8 C)  TempSrc: Oral  Weight: 182 lb 3.2 oz (82.645 kg)  SpO2: 100%    General: Alert, cooperative, NAD. HEENT: PERRL, EOMI. Moist mucus membranes Neck: Full range of motion without pain, supple, no lymphadenopathy or carotid bruits Lungs: Clear to ascultation bilaterally, normal work of respiration, no wheezes, rales, rhonchi Heart: RRR, no murmurs, gallops, or rubs Abdomen: Soft, non-tender, non-distended, BS + Extremities: No cyanosis, clubbing, or edema. Right knee with pain with active and passive ROM. "  Pop" heard with active extension. Grinding sensation over the patella with passive ROM. Negative anterior/posterior drawer, negative McMurray's. Hips stable, no pain. Only mild tenderness over the medial lumbar spine. Negative straight leg raise bilaterally.  Neurologic: Alert & oriented X3, cranial nerves II-XII intact, strength grossly intact, sensation intact to light touch   Assessment &  Plan:   Please see problem based assessment and plan.

## 2016-05-18 NOTE — Progress Notes (Signed)
Internal Medicine Clinic Attending  Case discussed with Dr. Jones at the time of the visit.  We reviewed the resident's history and exam and pertinent patient test results.  I agree with the assessment, diagnosis, and plan of care documented in the resident's note.  

## 2016-05-21 ENCOUNTER — Encounter: Payer: Self-pay | Admitting: Internal Medicine

## 2016-05-27 ENCOUNTER — Encounter: Payer: Self-pay | Admitting: Physical Therapy

## 2016-05-27 ENCOUNTER — Ambulatory Visit: Payer: Medicaid Other | Attending: Internal Medicine | Admitting: Physical Therapy

## 2016-05-27 DIAGNOSIS — R262 Difficulty in walking, not elsewhere classified: Secondary | ICD-10-CM | POA: Diagnosis present

## 2016-05-27 DIAGNOSIS — M25661 Stiffness of right knee, not elsewhere classified: Secondary | ICD-10-CM | POA: Diagnosis present

## 2016-05-27 DIAGNOSIS — M6281 Muscle weakness (generalized): Secondary | ICD-10-CM | POA: Insufficient documentation

## 2016-05-27 DIAGNOSIS — M25561 Pain in right knee: Secondary | ICD-10-CM | POA: Insufficient documentation

## 2016-05-27 NOTE — Patient Instructions (Signed)
  Ice 3 times/day 10-15 min each Water walking for exercise

## 2016-05-27 NOTE — Therapy (Signed)
[facility] [address] [place], [place], [address] Phone: [phone]   Fax:  [phone]  Physical Therapy Evaluation  Patient Details  Name: Stephanie Snyder MRN: [id number] Date of Birth: 11/14/1956 Referring Provider: Courtney ParisEden W Jones, MD  Encounter Date: 05/27/2016      PT End of Session - 05/27/16 0903    Visit Number 1   Number of Visits 13   Date for PT Re-Evaluation 07/10/16   Authorization Type medicare Kx at visit 15   PT Start Time 0805   PT Stop Time 0850   PT Time Calculation (min) 45 min   Activity Tolerance Patient limited by pain   Behavior During Therapy [facility] for tasks assessed/performed  repetition of multiple comments and stories      Past Medical History  Diagnosis Date  . Arthritis   . Hypertension   . Asthma   . CVA (cerebral vascular accident) (HCC) 2008  . Irregular heart beat 2015    Past Surgical History  Procedure Laterality Date  . Tubal ligation  1984    There were no vitals filed for this visit.       Subjective Assessment - 05/27/16 0814    Subjective Larey SeatFell a few years ago and hit a metal table, knee has been swelling since then. Reports knee swells like a balloon and pops. Swelling limits ability to wear some clothes.    Limitations Sitting;Standing;Walking;House hold activities   How long can you sit comfortably? 1 hour   How long can you stand comfortably? 1 hour   How long can you walk comfortably? 2 hours, better as long as she is moving   Patient Stated Goals descending hills, able to walk to go to classes, able to sit longer, stairs at home, into/off of busses   Currently in Pain? Yes   Pain Score 8    Pain Location Knee   Pain Orientation Right   Pain Descriptors / Indicators Aching;Sore  popping/clicking   Pain Type Chronic pain   Pain Frequency Intermittent   Aggravating Factors  standing/sitting still, eccentric control on stairs/inclines   Pain Relieving Factors  walking            [facility] - 05/27/16 0001    Assessment   Medical Diagnosis Arthralgia R lower leg   Referring Provider Courtney ParisEden W Jones, MD   Hand Dominance Right   Next MD Visit unknown   Prior Therapy none   Precautions   Precautions None   Restrictions   Weight Bearing Restrictions No   Balance Screen   Has the patient fallen in the past 6 months No  knee gives out occasionally but catches herself   Home Environment   Living Environment Private residence   Living Arrangements Spouse/significant other   Type of Home House   Home Layout Two level   Prior Function   Level of Independence Independent   Cognition   Overall Cognitive Status Within Functional Limits for tasks assessed   Observation/Other Assessments   Focus on Therapeutic Outcomes (FOTO)  77% limitation   ROM / Strength   AROM / PROM / Strength Strength;AROM   AROM   AROM Assessment Site Knee   Right/Left Knee Right   Right Knee Extension -9   Right Knee Flexion 79   Strength   Strength Assessment Site Hip;Knee   Right/Left Hip Right;Left   Right Hip Flexion 3/5   Right Hip Extension 4-/5   Right Hip ABduction 3/5  Left Hip Flexion 4+/5   Left Hip Extension 4/5   Right/Left Knee Right;Left   Right Knee Flexion 4-/5   Right Knee Extension 4+/5   Left Knee Flexion 4+/5   Left Knee Extension 5/5   Ambulation/Gait   Assistive device Straight cane   Gait Comments antalgic, foot flat, circumduction in swing through, hip hike, decreased stance time on RLE   Balance   Balance Assessed Yes   Balance comment poor stability in gait- unable to maintain straight line, holds on to wall and furniture when limping without cane for stability                   [facility] Adult PT Treatment/Exercise - 05/27/16 0001    Exercises   Exercises Knee/Hip   Knee/Hip Exercises: Supine   Straight Leg Raises 10 reps   Straight Leg Raises Limitations constant cuing required   Knee/Hip Exercises:  Sidelying   Clams x10 each  constant cuing required   Modalities   Modalities Cryotherapy   Cryotherapy   Number Minutes Cryotherapy 10 Minutes  concurrent with HEP education   Cryotherapy Location Knee   Type of Cryotherapy Ice pack                PT Education - 05/27/16 0901    Education provided Yes   Education Details anatomy of condition, POC, HEP   Person(s) Educated Patient   Methods Explanation;Demonstration;Tactile cues;Verbal cues;Handout   Comprehension Verbalized understanding;Returned demonstration;Verbal cues required;Tactile cues required;Need further instruction          PT Short Term Goals - 05/27/16 0915    PT SHORT TERM GOAL #1   Title Pt will verbalize compliance with and understanding of HEP as it has been established by 7/19   Time 3   Period Weeks   Status New   PT SHORT TERM GOAL #2   Title Will be able to demonstrate 10 SLR on RLE without rest break   Time 3   Period Weeks   Status New   PT SHORT TERM GOAL #3   Title Demonstrate heel-toe gait pattern   Time 3   Period Weeks   Status New           PT Long Term Goals - 05/27/16 0916    PT LONG TERM GOAL #1   Title Pt will be able to ascend and descend stairs at home with minimal difficulty by 8/11   Baseline severe difficulty climbing "about 30 steps"   Time 6   Period Weeks   Status New   PT LONG TERM GOAL #2   Title Pt will demonstrate verbalize improved eccentric control to descend inclines in the community, pain <5/10   Baseline 10/10 pain reported at evaluation   Time 6   Period Weeks   Status New   PT LONG TERM GOAL #3   Title Pt will report using techniques to decrease stiffness and pain in knee while seated for extended periods of time   Baseline severe pain when sitting for too long   Time 6   Period Weeks   Status New   PT LONG TERM GOAL #4   Title FOTO to 42% ability (58% disability) to indicate significant improvement in functional ability   Baseline 23%  ability (77% disability) at IE   Time 6   Period Weeks   Status New               Plan - 05/27/16 [id number]  Clinical Impression Statement Pt presents to PT today with complaints of knee pain that is making walking and other daily activities difficult. Pt lives in a second floor apartment home and rides the bus, requiring ambulation from bus stop to destination. Pt has severe limitations in ROM as well as notable weakness in lower extremity. Discussed water walking for exercise and weight loss to decrease pressure through knee and continue to strengthen. Notable edema in R knee v L. Pt will benefit from skilled PT to improve biomechanical chain strength and gait pattern. Was educated on limitations presented by anatomical changes.    Rehab Potential Fair   Clinical Impairments Affecting Rehab Potential severe lack of ROM and strength   PT Frequency 2x / week   PT Duration 6 weeks   PT Treatment/Interventions ADLs/Self Care Home Management;Cryotherapy;Electrical Stimulation;Ultrasound;Functional mobility training;Moist Heat;Iontophoresis /ml Dexamethasone;Gait training;Stair training;Therapeutic activities;Therapeutic exercise;Balance training;Patient/family education;Neuromuscular re-education;Manual techniques;Vasopneumatic Device;Taping;Passive range of motion   PT Next Visit Plan nu step, clamshells, prone hip ext, heel raises, wall sits   PT Home Exercise Plan clams, SLR, ice   Consulted and Agree with Plan of Care Patient      Patient will benefit from skilled therapeutic intervention in order to improve the following deficits and impairments:  Abnormal gait, Pain, Hypomobility, Decreased strength, Decreased range of motion, Decreased activity tolerance, Difficulty walking, Increased edema  Visit Diagnosis: Pain in right knee - Plan: PT plan of care cert/re-cert  Muscle weakness (generalized) - Plan: PT plan of care cert/re-cert  Difficulty in walking, not elsewhere classified  - Plan: PT plan of care cert/re-cert  Stiffness of right knee, not elsewhere classified - Plan: PT plan of care cert/re-cert      G-Codes - 06-25-2016 0920    Functional Assessment Tool Used FOTO 77% disability, clinical judgement/patient report   Functional Limitation Mobility: Walking and moving around   Mobility: Walking and Moving Around Current Status [phone]) At least 60 percent but less than 80 percent impaired, limited or restricted   Mobility: Walking and Moving Around Goal Status [phone]) At least 40 percent but less than 60 percent impaired, limited or restricted       Problem List Patient Active Problem List   Diagnosis Date Noted  . Encounter for screening mammogram for breast cancer 05/15/2016  . Lumbar radiculopathy 05/15/2016  . Pain in joint, lower leg 02/28/2016  . Essential hypertension 02/14/2016  . Asthma 02/14/2016    Osborne Serio C. Keeshia Sanderlin PT, DPT 06-25-2016 9:25 AM   [facility] [address] [place], [place], [address] Phone: [phone]   Fax:  [phone]  Name: Stephanie Snyder MRN: [id number] Date of Birth: Apr 09, 1956

## 2016-06-09 ENCOUNTER — Ambulatory Visit: Payer: Medicaid Other | Attending: Internal Medicine | Admitting: Physical Therapy

## 2016-06-09 DIAGNOSIS — M25561 Pain in right knee: Secondary | ICD-10-CM | POA: Insufficient documentation

## 2016-06-09 DIAGNOSIS — R262 Difficulty in walking, not elsewhere classified: Secondary | ICD-10-CM | POA: Insufficient documentation

## 2016-06-09 DIAGNOSIS — M25661 Stiffness of right knee, not elsewhere classified: Secondary | ICD-10-CM | POA: Insufficient documentation

## 2016-06-09 DIAGNOSIS — M6281 Muscle weakness (generalized): Secondary | ICD-10-CM | POA: Insufficient documentation

## 2016-06-12 ENCOUNTER — Ambulatory Visit: Payer: Medicaid Other | Admitting: Physical Therapy

## 2016-06-12 DIAGNOSIS — M25661 Stiffness of right knee, not elsewhere classified: Secondary | ICD-10-CM | POA: Diagnosis present

## 2016-06-12 DIAGNOSIS — M25561 Pain in right knee: Secondary | ICD-10-CM | POA: Diagnosis not present

## 2016-06-12 DIAGNOSIS — R262 Difficulty in walking, not elsewhere classified: Secondary | ICD-10-CM

## 2016-06-12 DIAGNOSIS — M6281 Muscle weakness (generalized): Secondary | ICD-10-CM

## 2016-06-12 NOTE — Patient Instructions (Signed)
   Hamstring Stretch   With other leg bent, foot flat, grasp right leg and slowly try to straighten knee. Hold _30___ seconds. Repeat ____ times. Do ____ sessions per day.  HIP / KNEE: Flexion, Knee to Chest - Supine    Raise knee to chest. Keep leg in a straight line. Perform slowly USE HANDS BEHIND KNEE.LET KNEE BEND AND HOLD FOR 30 seconds. . _3__ reps per set, _2__ sets per day, _7__ days per week  Heel Raises    Stand with support. Tighten pelvic floor and hold. With knees straight, raise heels off ground. Hold _5__ seconds. Relax for _1__ seconds. Repeat _10-20__ times. Do 2___ times a day.

## 2016-06-12 NOTE — Therapy (Signed)
[facility] [place], [place], [address] Phone: [phone]   Fax:  [phone]  Physical Therapy Treatment  Patient Details  Name: Stephanie Snyder MRN: [id number] Date of Birth: 1956/02/06 Referring Provider: Corky Sox, MD  Encounter Date: 06/12/2016      PT End of Session - 06/12/16 1021    Visit Number 2   Number of Visits 13   Date for PT Re-Evaluation 07/10/16   Authorization Type medicare Kx at visit 15   PT Start Time 1018   PT Stop Time 1115   PT Time Calculation (min) 57 min      Past Medical History  Diagnosis Date  . Arthritis   . Hypertension   . Asthma   . CVA (cerebral vascular accident) ([place]) 2008  . Irregular heart beat 2015    Past Surgical History  Procedure Laterality Date  . Tubal ligation  1984    There were no vitals filed for this visit.      Subjective Assessment - 06/12/16 1110    Subjective The bus dropped me at the wrong stop. I had to walk.    Currently in Pain? Yes   Pain Score 8    Pain Location Knee   Pain Orientation Right   Aggravating Factors  standing, sitting still, stairs   Pain Relieving Factors walking                         [facility] Adult PT Treatment/Exercise - 06/12/16 0001    Knee/Hip Exercises: Stretches   Active Hamstring Stretch 3 reps;30 seconds   Active Hamstring Stretch Limitations starting at 90/90   Knee: Self-Stretch to increase Flexion 3 reps;30 seconds   Knee: Self-Stretch Limitations knee to chest   Knee/Hip Exercises: Aerobic   Nustep L2 x 5 min UE/LE   Knee/Hip Exercises: Standing   Heel Raises 10 reps   Wall Squat 10 reps   Knee/Hip Exercises: Seated   Long Arc Quad 20 reps   Knee/Hip Exercises: Supine   Heel Slides 10 reps   Straight Leg Raises 10 reps   Cryotherapy   Number Minutes Cryotherapy 10 Minutes   Cryotherapy Location Knee   Type of Cryotherapy Ice pack                PT Education -  06/12/16 1053    Education provided Yes   Education Details HEP   Person(s) Educated Patient   Methods Explanation;Handout   Comprehension Verbalized understanding          PT Short Term Goals - 06/12/16 1111    PT SHORT TERM GOAL #1   Title Pt will verbalize compliance with and understanding of HEP as it has been established by 7/19   Baseline needs cues   Time 3   Period Weeks   Status Partially Met   PT SHORT TERM GOAL #2   Title Will be able to demonstrate 10 SLR on RLE without rest break   Baseline small ROM and increased pain at right posterior hip   Time 3   Period Weeks   Status Partially Met   PT SHORT TERM GOAL #3   Title Demonstrate heel-toe gait pattern   Time 3   Period Weeks   Status On-going           PT Long Term Goals - 05/27/16 0916    PT LONG TERM GOAL #1   Title Pt will be  able to ascend and descend stairs at home with minimal difficulty by 8/11   Baseline severe difficulty climbing "about 30 steps"   Time 6   Period Weeks   Status New   PT LONG TERM GOAL #2   Title Pt will demonstrate verbalize improved eccentric control to descend inclines in the community, pain <5/10   Baseline 10/10 pain reported at evaluation   Time 6   Period Weeks   Status New   PT LONG TERM GOAL #3   Title Pt will report using techniques to decrease stiffness and pain in knee while seated for extended periods of time   Baseline severe pain when sitting for too long   Time 6   Period Weeks   Status New   PT LONG TERM GOAL #4   Title FOTO to 42% ability (58% disability) to indicate significant improvement in functional ability   Baseline 23% ability (77% disability) at IE   Time 6   Period Weeks   Status New               Plan - 06/12/16 1112    Clinical Impression Statement Review of HEP. right hip pain with SLR. Needs cues for position with clam. She has been doing them at home. we began Nustep heel raises and wall slides per PT POC. Good Tolerance.  Attempted prone hip extension with pt unable to lift leg and demonstrates hip flexor tightness. Unable to bridge due to lack of knee flexion on right. Began knee flexion stretching with knee to chest stretch, Added this and hamstring stretch to HEP.    PT Next Visit Plan continue Nustep and wall slides , review HEP, try hip flexor stretch on right and consider standing hip extension,    PT Home Exercise Plan clams, SLR, ice, heel raises, knee to chest, hamstring stretch      Patient will benefit from skilled therapeutic intervention in order to improve the following deficits and impairments:  Abnormal gait, Pain, Hypomobility, Decreased strength, Decreased range of motion, Decreased activity tolerance, Difficulty walking, Increased edema  Visit Diagnosis: Pain in right knee  Muscle weakness (generalized)  Difficulty in walking, not elsewhere classified  Stiffness of right knee, not elsewhere classified     Problem List Patient Active Problem List   Diagnosis Date Noted  . Encounter for screening mammogram for breast cancer 05/15/2016  . Lumbar radiculopathy 05/15/2016  . Pain in joint, lower leg 02/28/2016  . Essential hypertension 02/14/2016  . Asthma 02/14/2016    Dorene Ar, PTA 06/12/2016, 11:17 AM  [facility] [address] [place], [place], [address] Phone: [phone]   Fax:  [phone]  Name: Kai Railsback MRN: [id number] Date of Birth: 1956-11-22

## 2016-06-15 ENCOUNTER — Ambulatory Visit: Payer: Medicaid Other | Admitting: Physical Therapy

## 2016-06-15 DIAGNOSIS — R262 Difficulty in walking, not elsewhere classified: Secondary | ICD-10-CM

## 2016-06-15 DIAGNOSIS — M6281 Muscle weakness (generalized): Secondary | ICD-10-CM

## 2016-06-15 DIAGNOSIS — M25561 Pain in right knee: Secondary | ICD-10-CM | POA: Diagnosis not present

## 2016-06-15 DIAGNOSIS — M25661 Stiffness of right knee, not elsewhere classified: Secondary | ICD-10-CM

## 2016-06-15 NOTE — Therapy (Signed)
[place], [place], [address] Phone: [phone]   Fax:  [phone]  Physical Therapy Treatment  Patient Details  Name: Stephanie Snyder MRN: [id number] Date of Birth: 15-Feb-1956 Referring Provider: Corky Sox, MD  Encounter Date: 06/15/2016      PT End of Session - 06/15/16 1244    Visit Number 3   Number of Visits 13   Date for PT Re-Evaluation 07/10/16   Authorization Type medicare Kx at visit 15   PT Start Time 1239   PT Stop Time 1333   PT Time Calculation (min) 54 min      Past Medical History  Diagnosis Date  . Arthritis   . Hypertension   . Asthma   . CVA (cerebral vascular accident) ([place]) 2008  . Irregular heart beat 2015    Past Surgical History  Procedure Laterality Date  . Tubal ligation  1984    There were no vitals filed for this visit.      Subjective Assessment - 06/15/16 1245    Subjective I almost fell in the bath tub. He caught me.    Currently in Pain? Yes   Pain Score 7    Pain Location Knee   Pain Orientation Right   Pain Descriptors / Indicators Throbbing            [facility] PT Assessment - 06/15/16 0001    AROM   Right Knee Flexion 88  during knee to chest                     [facility] Adult PT Treatment/Exercise - 06/15/16 0001    Ambulation/Gait   Pre-Gait Activities staggered standing rocking heel to toe weight shifting    Gait Comments Worked on heel strike and toe off, taking equal steps, moved cane to Left hand and lowered.    Knee/Hip Exercises: Stretches   Active Hamstring Stretch 3 reps;30 seconds   Active Hamstring Stretch Limitations starting at 90/90   Hip Flexor Stretch 3 reps;30 seconds   Hip Flexor Stretch Limitations right leg off edge of table   Knee: Self-Stretch to increase Flexion 3 reps;30 seconds   Knee: Self-Stretch Limitations knee to chest   Knee/Hip Exercises: Aerobic   Nustep L3 x 6 minutes LE only   Knee/Hip  Exercises: Standing   Heel Raises 15 reps   Knee Flexion 15 reps   Other Standing Knee Exercises right hip extension 10 x 2 cues for posture   Knee/Hip Exercises: Seated   Long Arc Quad 15 reps   Long Arc Quad Weight 2 lbs.   Knee/Hip Exercises: Supine   Quad Sets 10 reps   Straight Leg Raises 10 reps  12 reps   Knee/Hip Exercises: Sidelying   Hip ABduction 1 set;10 reps   Hip ABduction Limitations right   Cryotherapy   Number Minutes Cryotherapy 10 Minutes   Cryotherapy Location Knee   Type of Cryotherapy Ice pack                  PT Short Term Goals - 06/12/16 1111    PT SHORT TERM GOAL #1   Title Pt will verbalize compliance with and understanding of HEP as it has been established by 7/19   Baseline needs cues   Time 3   Period Weeks   Status Partially Met   PT SHORT TERM GOAL #2   Title Will be able to demonstrate 10 SLR on RLE without  rest break   Baseline small ROM and increased pain at right posterior hip   Time 3   Period Weeks   Status Partially Met   PT SHORT TERM GOAL #3   Title Demonstrate heel-toe gait pattern   Time 3   Period Weeks   Status On-going           PT Long Term Goals - 05/27/16 0916    PT LONG TERM GOAL #1   Title Pt will be able to ascend and descend stairs at home with minimal difficulty by 8/11   Baseline severe difficulty climbing "about 30 steps"   Time 6   Period Weeks   Status New   PT LONG TERM GOAL #2   Title Pt will demonstrate verbalize improved eccentric control to descend inclines in the community, pain <5/10   Baseline 10/10 pain reported at evaluation   Time 6   Period Weeks   Status New   PT LONG TERM GOAL #3   Title Pt will report using techniques to decrease stiffness and pain in knee while seated for extended periods of time   Baseline severe pain when sitting for too long   Time 6   Period Weeks   Status New   PT LONG TERM GOAL #4   Title FOTO to 42% ability (58% disability) to indicate  significant improvement in functional ability   Baseline 23% ability (77% disability) at IE   Time 6   Period Weeks   Status New               Plan - 06/15/16 1310    Clinical Impression Statement SLR endurance improving. Progressing toward short term goals. Worked on Personnel officer. Moved cane to left hand and lowered. Pt demonstrates decreased antalgic gait after adjusting cane. encouraged more right knee flexion and heel strike with gait. Hip flexor stetch and standing hip extension on right added to HEP.     PT Next Visit Plan continue Nustep and wall slides , review HEP, Bike for ROM.       Patient will benefit from skilled therapeutic intervention in order to improve the following deficits and impairments:  Abnormal gait, Pain, Hypomobility, Decreased strength, Decreased range of motion, Decreased activity tolerance, Difficulty walking, Increased edema  Visit Diagnosis: Pain in right knee  Muscle weakness (generalized)  Difficulty in walking, not elsewhere classified  Stiffness of right knee, not elsewhere classified     Problem List Patient Active Problem List   Diagnosis Date Noted  . Encounter for screening mammogram for breast cancer 05/15/2016  . Lumbar radiculopathy 05/15/2016  . Pain in joint, lower leg 02/28/2016  . Essential hypertension 02/14/2016  . Asthma 02/14/2016    Dorene Ar, PTA 06/15/2016, 1:43 PM  [facility] [address] [place], [place], [address] Phone: [phone]   Fax:  [phone]  Name: Cybele Maule MRN: [id number] Date of Birth: 20-Dec-1955

## 2016-06-15 NOTE — Patient Instructions (Addendum)
EXTENSION: Standing (Active)    Stand, both feet flat. Draw right leg behind body as far as possible.  Complete _2__ sets of _10__ repetitions. Perform _2__ sessions per day.  Hamstring Curl    Stand on both legs. Inhaling, shift weight onto left leg. Exhaling, bend right leg, heel toward buttock then inhale and placing right leg down, shift weight onto right leg. Repeat with other leg. Repeat __10_ times, do 2 sets.. Do _2__ times per day.    Hip Flexor Stretch    Lying on back near edge of bed, bend one leg, foot flat. Hang other leg over edge, relaxed, thigh resting entirely on bed for __1__ minutes. Repeat __1__ times. Do _2___ sessions per day.

## 2016-06-19 ENCOUNTER — Ambulatory Visit: Payer: Medicaid Other | Admitting: Physical Therapy

## 2016-06-19 DIAGNOSIS — M25561 Pain in right knee: Secondary | ICD-10-CM | POA: Diagnosis not present

## 2016-06-19 DIAGNOSIS — M25661 Stiffness of right knee, not elsewhere classified: Secondary | ICD-10-CM

## 2016-06-19 DIAGNOSIS — M6281 Muscle weakness (generalized): Secondary | ICD-10-CM

## 2016-06-19 DIAGNOSIS — R262 Difficulty in walking, not elsewhere classified: Secondary | ICD-10-CM

## 2016-06-19 NOTE — Therapy (Signed)
[place], [place], [address] Phone: [phone]   Fax:  [phone]  Physical Therapy Treatment  Patient Details  Name: Stephanie Snyder MRN: [id number] Date of Birth: 10/17/1956 Referring Provider: Corky Sox, MD  Encounter Date: 06/19/2016      PT End of Session - 06/19/16 1051    Visit Number 4   Number of Visits 13   Date for PT Re-Evaluation 07/10/16   Authorization Type medicare Kx at visit 15   PT Start Time 1015   PT Stop Time 1108   PT Time Calculation (min) 53 min      Past Medical History  Diagnosis Date  . Arthritis   . Hypertension   . Asthma   . CVA (cerebral vascular accident) ([place]) 2008  . Irregular heart beat 2015    Past Surgical History  Procedure Laterality Date  . Tubal ligation  1984    There were no vitals filed for this visit.      Subjective Assessment - 06/19/16 1048    Subjective I am better today   Currently in Pain? Yes   Pain Score 6    Pain Location Knee   Pain Orientation Right            [facility] PT Assessment - 06/19/16 0001    Assessment   Medical Diagnosis                                        AROM   Right Knee Flexion 90  during knee to chest, after stretching                     [facility] Adult PT Treatment/Exercise - 06/19/16 0001    Knee/Hip Exercises: Stretches   Active Hamstring Stretch 3 reps;30 seconds   Active Hamstring Stretch Limitations starting at 90/90   Hip Flexor Stretch 3 reps;30 seconds   Hip Flexor Stretch Limitations right leg off edge of table   Knee: Self-Stretch to increase Flexion 3 reps;30 seconds   Knee: Self-Stretch Limitations knee to chest   Knee/Hip Exercises: Aerobic   Nustep L5 x 7 minutes   Knee/Hip Exercises: Supine   Quad Sets 10 reps   Bridges Limitations on and off ball x 1 0 each    Straight Leg Raises 15 reps   Other Supine Knee/Hip Exercises hamstring curls on ball x 10 bilateral and  single leg   Knee/Hip Exercises: Sidelying   Hip ABduction 1 set;15 reps   Hip ABduction Limitations right   Knee/Hip Exercises: Prone   Hamstring Curl 20 reps   Hamstring Curl Limitations prone knee flexion stretch 30 sec x 3   Cryotherapy   Number Minutes Cryotherapy 10 Minutes   Cryotherapy Location Knee   Type of Cryotherapy Ice pack                PT Education - 06/19/16 1055    Education provided Yes   Education Details Prone knee flexion stretch   Person(s) Educated Patient   Methods Explanation;Handout   Comprehension Verbalized understanding          PT Short Term Goals - 06/19/16 1051    PT SHORT TERM GOAL #1   Title Pt will verbalize compliance with and understanding of HEP as it has been established by 7/19   Time 3   Period Weeks  Status Achieved   PT SHORT TERM GOAL #2   Title Will be able to demonstrate 10 SLR on RLE without rest break   Time 3   Period Weeks   Status Achieved   PT SHORT TERM GOAL #3   Title Demonstrate heel-toe gait pattern   Baseline needs cues   Time 3   Period Weeks   Status On-going           PT Long Term Goals - 05/27/16 0916    PT LONG TERM GOAL #1   Title Pt will be able to ascend and descend stairs at home with minimal difficulty by 8/11   Baseline severe difficulty climbing "about 30 steps"   Time 6   Period Weeks   Status New   PT LONG TERM GOAL #2   Title Pt will demonstrate verbalize improved eccentric control to descend inclines in the community, pain <5/10   Baseline 10/10 pain reported at evaluation   Time 6   Period Weeks   Status New   PT LONG TERM GOAL #3   Title Pt will report using techniques to decrease stiffness and pain in knee while seated for extended periods of time   Baseline severe pain when sitting for too long   Time 6   Period Weeks   Status New   PT LONG TERM GOAL #4   Title FOTO to 42% ability (58% disability) to indicate significant improvement in functional ability    Baseline 23% ability (77% disability) at IE   Time 6   Period Weeks   Status New               Plan - 06/19/16 1100    Clinical Impression Statement Overall pain levels decreasing and pt demonstrating increased tolerance to therex. Began prone knee flexion stretch with strap and updated HEP.  Improved endurance to SLR, able to complete 15 reps without rest in clinic. Independent with HEP. STG#1,#2 Met.    PT Next Visit Plan continue Nustep and wall slides, prone knee flexion , review HEP, begin step ups? gait   PT Home Exercise Plan clams, SLR, ice, heel raises, knee to chest, hamstring stretch, prone knee flexion stretch      Patient will benefit from skilled therapeutic intervention in order to improve the following deficits and impairments:  Abnormal gait, Pain, Hypomobility, Decreased strength, Decreased range of motion, Decreased activity tolerance, Difficulty walking, Increased edema  Visit Diagnosis: Pain in right knee  Muscle weakness (generalized)  Difficulty in walking, not elsewhere classified  Stiffness of right knee, not elsewhere classified     Problem List Patient Active Problem List   Diagnosis Date Noted  . Encounter for screening mammogram for breast cancer 05/15/2016  . Lumbar radiculopathy 05/15/2016  . Pain in joint, lower leg 02/28/2016  . Essential hypertension 02/14/2016  . Asthma 02/14/2016    Dorene Ar, PTA 06/19/2016, 11:33 AM  [facility] [address] [place], [place], [address] Phone: [phone]   Fax:  [phone]  Name: Stephanie Snyder MRN: [id number] Date of Birth: [birth year]

## 2016-06-19 NOTE — Patient Instructions (Signed)
KNEE: Flexion - Prone   USE BELT OR SHEET TO PULL HEEL TOWARD BUTTOCK  Bend knee. Raise heel toward buttocks. HOLD 30 seconds. Do not raise hips. _3__ reps per set, __2_ sets per day, __7_ days per week   Copyright  VHI. All rights reserved.

## 2016-06-22 ENCOUNTER — Ambulatory Visit: Payer: Medicaid Other | Admitting: Physical Therapy

## 2016-06-22 DIAGNOSIS — M25561 Pain in right knee: Secondary | ICD-10-CM | POA: Diagnosis not present

## 2016-06-22 DIAGNOSIS — R262 Difficulty in walking, not elsewhere classified: Secondary | ICD-10-CM

## 2016-06-22 DIAGNOSIS — M6281 Muscle weakness (generalized): Secondary | ICD-10-CM

## 2016-06-22 DIAGNOSIS — M25661 Stiffness of right knee, not elsewhere classified: Secondary | ICD-10-CM

## 2016-06-22 NOTE — Therapy (Signed)
[facility] [address] [place], [place], [address] Phone: [phone]   Fax:  [phone]  Physical Therapy Treatment  Patient Details  Name: Stephanie Snyder MRN: [id number] Date of Birth: 10/08/1956 Referring Provider: Courtney Paris, MD  Encounter Date: 06/22/2016      PT End of Session - 06/22/16 1251    Visit Number 5   Number of Visits 13   Date for PT Re-Evaluation 07/10/16   Authorization Type medicare Kx at visit 15   PT Start Time 1230   PT Stop Time 1330   PT Time Calculation (min) 60 min      Past Medical History:  Diagnosis Date  . Arthritis   . Asthma   . CVA (cerebral vascular accident) ([facility]) 2008  . Hypertension   . Irregular heart beat 2015    Past Surgical History:  Procedure Laterality Date  . TUBAL LIGATION  1984    There were no vitals filed for this visit.      Subjective Assessment - 06/22/16 1236    Subjective Its not hurting as much as it usually does   Currently in Pain? No/denies  up tpo 6/10 with walking   Aggravating Factors  standing prolonged sitting, stairs   Pain Relieving Factors keep moving , walking                         [facility] Adult PT Treatment/Exercise - 06/22/16 0001      Ambulation/Gait   Stairs Yes   Stair Management Technique Two rails;Alternating pattern   Height of Stairs 6   Gait Comments Worked on proper form with ascending and descending stairs to avoid compensations. Pt required max verbal and tactile cues      Knee/Hip Exercises: Stretches   Active Hamstring Stretch 3 reps;30 seconds   Active Hamstring Stretch Limitations starting at 90/90   Hip Flexor Stretch 3 reps;30 seconds   Hip Flexor Stretch Limitations right leg off edge of table     Knee/Hip Exercises: Aerobic   Nustep L5 x 7 minutes     Knee/Hip Exercises: Standing   Forward Step Up 2 sets;10 reps;Hand Hold: 1;Step Height: 6"     Knee/Hip Exercises: Sidelying   Hip  ABduction 20 reps     Knee/Hip Exercises: Prone   Hamstring Curl 20 reps   Hamstring Curl Limitations prone knee flexion stretch 30 sec x 3     Cryotherapy   Number Minutes Cryotherapy 15 Minutes   Cryotherapy Location Knee   Type of Cryotherapy Ice pack                  PT Short Term Goals - 06/19/16 1051      PT SHORT TERM GOAL #1   Title Pt will verbalize compliance with and understanding of HEP as it has been established by 7/19   Time 3   Period Weeks   Status Achieved     PT SHORT TERM GOAL #2   Title Will be able to demonstrate 10 SLR on RLE without rest break   Time 3   Period Weeks   Status Achieved     PT SHORT TERM GOAL #3   Title Demonstrate heel-toe gait pattern   Baseline needs cues   Time 3   Period Weeks   Status On-going           PT Long Term Goals - 05/27/16 4944  PT LONG TERM GOAL #1   Title Pt will be able to ascend and descend stairs at home with minimal difficulty by 8/11   Baseline severe difficulty climbing "about 30 steps"   Time 6   Period Weeks   Status New     PT LONG TERM GOAL #2   Title Pt will demonstrate verbalize improved eccentric control to descend inclines in the community, pain <5/10   Baseline 10/10 pain reported at evaluation   Time 6   Period Weeks   Status New     PT LONG TERM GOAL #3   Title Pt will report using techniques to decrease stiffness and pain in knee while seated for extended periods of time   Baseline severe pain when sitting for too long   Time 6   Period Weeks   Status New     PT LONG TERM GOAL #4   Title FOTO to 42% ability (58% disability) to indicate significant improvement in functional ability   Baseline 23% ability (77% disability) at IE   Time 6   Period Weeks   Status New               Plan - 06/22/16 1353    Clinical Impression Statement Pt continues to report dereasing pain levels however up to 6-7/10 pain with standing. We worked on knee flexion ROM wtih pt  reporting back pain with hip flexor stretch supine. Prone knee flexion stretch more comfortable with pillow under hips. We also began step ups with pt requiring max verbal and tactile cues to perfrom correctly. Pt reports increased RLE edema today and yesterday both ankles. Encouraged her to contact her primary MD.    PT Next Visit Plan continue Nustep and wall slides, prone knee flexion , review HEP, continue step ups? gait      Patient will benefit from skilled therapeutic intervention in order to improve the following deficits and impairments:  Abnormal gait, Pain, Hypomobility, Decreased strength, Decreased range of motion, Decreased activity tolerance, Difficulty walking, Increased edema  Visit Diagnosis: Pain in right knee  Muscle weakness (generalized)  Difficulty in walking, not elsewhere classified  Stiffness of right knee, not elsewhere classified     Problem List Patient Active Problem List   Diagnosis Date Noted  . Encounter for screening mammogram for breast cancer 05/15/2016  . Lumbar radiculopathy 05/15/2016  . Pain in joint, lower leg 02/28/2016  . Essential hypertension 02/14/2016  . Asthma 02/14/2016    Sherrie Mustache, PTA 06/22/2016, 1:59 PM  [facility] [address] [place], [place], [address] Phone: [phone]   Fax:  [phone]  Name: Stephanie Snyder MRN: [id number] Date of Birth: [birth year]

## 2016-06-25 ENCOUNTER — Ambulatory Visit: Payer: Medicaid Other

## 2016-06-26 ENCOUNTER — Ambulatory Visit: Payer: Medicaid Other | Admitting: Physical Therapy

## 2016-06-26 ENCOUNTER — Ambulatory Visit: Payer: Medicaid Other | Admitting: Pharmacist

## 2016-06-26 ENCOUNTER — Encounter: Payer: Self-pay | Admitting: Internal Medicine

## 2016-06-26 ENCOUNTER — Ambulatory Visit (INDEPENDENT_AMBULATORY_CARE_PROVIDER_SITE_OTHER): Payer: Medicaid Other | Admitting: Internal Medicine

## 2016-06-26 DIAGNOSIS — I10 Essential (primary) hypertension: Secondary | ICD-10-CM

## 2016-06-26 DIAGNOSIS — R600 Localized edema: Secondary | ICD-10-CM | POA: Diagnosis not present

## 2016-06-26 MED ORDER — LISINOPRIL 10 MG PO TABS: 10.0000 mg | ORAL_TABLET | Freq: Every day | ORAL | 3 refills | Status: DC

## 2016-06-26 MED ORDER — LISINOPRIL-HYDROCHLOROTHIAZIDE 20-25 MG PO TABS: 1.0000 | ORAL_TABLET | Freq: Every day | ORAL | 5 refills | Status: DC

## 2016-06-26 MED ORDER — HYDROCHLOROTHIAZIDE 25 MG PO TABS: 25.0000 mg | ORAL_TABLET | Freq: Every day | ORAL | 5 refills | Status: DC

## 2016-06-26 MED ORDER — AMLODIPINE BESYLATE 10 MG PO TABS: 10.0000 mg | ORAL_TABLET | Freq: Every day | ORAL | 5 refills | Status: DC

## 2016-06-26 MED ORDER — ALBUTEROL SULFATE HFA 108 (90 BASE) MCG/ACT IN AERS: 2.0000 | INHALATION_SPRAY | Freq: Four times a day (QID) | RESPIRATORY_TRACT | 3 refills | Status: DC | PRN

## 2016-06-26 NOTE — Progress Notes (Signed)
   CC: Pedal edema HPI: [name] is a 60 y.o. female with a h/o of Arthritis, asthma, CVA, hypertension and more radiculopathy who presents with increased bilateral lower extremity edema. She denies headaches, changes in vision, chest pain, shortness of breath, nausea, vomiting or abdominal pain. She denies fevers, chills or night sweats. She has no additional acute complaints or concerns at today's visit.  Please see problem-based charting for status of medical issues pertinent to this visit.     Past Medical History:  Diagnosis Date  . Arthritis   . Asthma   . CVA (cerebral vascular accident) ([facility]) 2008  . Hypertension   . Irregular heart beat 2015   Current Outpatient Rx  . Order #: [id number] Class: Normal  . Order #: [id number] Class: Normal  . Order #: [id number] Class: Normal  . Order #: [id number] Class: Normal  . Order #: [id number] Class: Normal     Review of Systems: A complete ROS was negative except as per HPI.  Physical Exam: Vitals:   06/26/16 1448  BP: (!) 181/89  Pulse: 73  Temp: 98.5 F (36.9 C)  TempSrc: Oral  SpO2: 100%  Weight: 182 lb 14.4 oz (83 kg)  Height: 5\' 2"  (1.575 m)   BP (!) 181/89 (BP Location: Right Arm, Patient Position: Sitting, Cuff Size: Small)   Pulse 73   Temp 98.5 F (36.9 C) (Oral)   Ht 5\' 2"  (1.575 m)   Wt 182 lb 14.4 oz (83 kg)   SpO2 100%   BMI 33.45 kg/m  General appearance: alert, cooperative and Obese Head: Normocephalic, without obvious abnormality, atraumatic Lungs: clear to auscultation bilaterally Heart: regular rate and rhythm, S1, S2 normal, no murmur, click, rub or gallop Abdomen: soft, non-tender; bowel sounds normal; no masses,  no organomegaly and no abdominal bruits auscultated Extremities: bilateral lower extremity and pedal edema, swollen tender right patella, limited range of motion in the right knee, normal range of motion the left knee, normal strength of lower extremity is  bilaterally  Assessment & Plan:  See encounters tab for problem based medical decision making. Patient seen with Dr. Rogelia Boga  Signed: Thomasene Lot, MD 06/26/2016, 2:57 PM  Pager: [phone]

## 2016-06-26 NOTE — Addendum Note (Signed)
Addended by: Mliss Fritz on: 06/26/2016 03:59 PM   Modules accepted: Orders

## 2016-06-26 NOTE — Patient Instructions (Signed)
It was a pleasure seeing you today. Thank you for choosing [facility] for your healthcare needs.  -- please restart her blood pressure medications including amlodipine, hydrochlorothiazide and lisinopril -- Elevate feet during the day to decrease swelling -- Continue to work with physical therapy -- Return to clinic in 2 weeks for blood pressure follow-up and basic metabolic panel

## 2016-06-26 NOTE — Assessment & Plan Note (Signed)
Patient presents with some mild bilateral pedal edema and hypertension. She remains hypertensive and not at her goal. She's been out of her antihypertensive medications and has not taken them. I re-prescribed all of her antihypertensive medications and instructed her to take these. Additionally, I think this pedal edema is secondary to fluid retention and will improve when she restarts the hydrochlorothiazide. She denies orthopnea, paroxysmal nocturnal dyspnea, shortness of breath, chest pain or palpitations. Currently, I think her bilateral pedal edema is dependent edema and not cardiac or renal in etiology. -- Continue amlodipine 10 mg once daily, hydrochlorothiazide 25 mg once daily, lisinopril 10 mg once daily -- Return to clinic in 2 weeks for blood pressure follow-up and basic metabolic panel -- I encouraged the patient to elevate her legs throughout the day to decrease dependent pedal edema

## 2016-06-26 NOTE — Progress Notes (Signed)
Internal Medicine Clinic Attending  I saw and evaluated the patient.  I personally confirmed the key portions of the history and exam documented by Dr. Ladona Ridgel and I reviewed pertinent patient test results.  The assessment, diagnosis, and plan were formulated together and I agree with the documentation in the resident's note. Dr Selena Batten arranged for pharmacy delivery of meds.

## 2016-06-26 NOTE — Patient Instructions (Signed)
Patient educated about medication as defined in this encounter and verbalized understanding by repeating back instructions provided.   

## 2016-06-26 NOTE — Progress Notes (Signed)
Physician-pharmacist co-visit  S:    Stephanie Snyder is a 60 y.o. female who states she needs help with medications. She has challenges making it to the pharmacy monthly and states she has been out of her HTN medications. Also states she has challenges affording.  HTN medications: amlodipine 10 mg/day, lisinopril 10 mg/day, HCTZ 25 mg/day  Allergies  Allergen Reactions  . Latex Hives   O:  BP Readings from Last 3 Encounters:  06/26/16 (!) 181/89  05/15/16 (!) 174/97  02/28/16 140/74   Pertinent labs:    Component Value Date/Time   NA 144 02/28/2016 1357   K 4.2 02/28/2016 1357   CL 104 02/28/2016 1357   CO2 20 02/28/2016 1357   GLUCOSE 91 02/28/2016 1357   GLUCOSE 105 (H) 09/07/2014 0720   BUN 19 02/28/2016 1357   CREATININE 0.84 02/28/2016 1357   CALCIUM 9.3 02/28/2016 1357   GFRAA 87 02/28/2016 1357   A/P: HTN currently uncontrolled  Changes/recommendations: transfer medications to delivery pharmacy, combine lisinopril-HCTZ for lower copay and tablets/day.  Medication Samples have been provided to the patient.  Drug: albuterol (proventil) Strength: 90 mcg Qty: 1 LOT: [id number] Exp.Date: 3/19 Dosing instructions: 2 puffs into the lungs every 6 (six) hours as needed for wheezing or shortness of breath  The patient has been instructed regarding the correct time, dose, and frequency of taking this medication, including desired effects and most common side effects.   Patient verbalized understanding of above information.  Follow-up 07/10/16  Izaan Kingbird J

## 2016-06-29 ENCOUNTER — Encounter: Payer: Medicaid Other | Admitting: Physical Therapy

## 2016-06-30 ENCOUNTER — Ambulatory Visit: Payer: Medicaid Other | Attending: Internal Medicine | Admitting: Physical Therapy

## 2016-06-30 ENCOUNTER — Encounter: Payer: Self-pay | Admitting: Physical Therapy

## 2016-06-30 DIAGNOSIS — M25661 Stiffness of right knee, not elsewhere classified: Secondary | ICD-10-CM | POA: Diagnosis present

## 2016-06-30 DIAGNOSIS — M25561 Pain in right knee: Secondary | ICD-10-CM | POA: Insufficient documentation

## 2016-06-30 DIAGNOSIS — R262 Difficulty in walking, not elsewhere classified: Secondary | ICD-10-CM | POA: Insufficient documentation

## 2016-06-30 DIAGNOSIS — M6281 Muscle weakness (generalized): Secondary | ICD-10-CM | POA: Diagnosis present

## 2016-06-30 NOTE — Therapy (Signed)
[place], [place], [address] Phone: [phone]   Fax:  [phone]  Physical Therapy Treatment/Discharge Summary  Patient Details  Name: Stephanie Snyder MRN: [id number] Date of Birth: 1956-03-08 Referring Provider: Corky Sox, MD  Encounter Date: 06/30/2016      PT End of Session - 06/30/16 1141    Visit Number 6   Number of Visits 13   Date for PT Re-Evaluation 07/10/16   Authorization Type medicare Kx at visit 15   PT Start Time 1105   PT Stop Time 1144   PT Time Calculation (min) 39 min   Activity Tolerance Patient limited by pain   Behavior During Therapy [facility] for tasks assessed/performed      Past Medical History:  Diagnosis Date  . Arthritis   . Asthma   . CVA (cerebral vascular accident) ([place]) 2008  . Hypertension   . Irregular heart beat 2015    Past Surgical History:  Procedure Laterality Date  . TUBAL LIGATION  1984    There were no vitals filed for this visit.      Subjective Assessment - 06/30/16 1110    Subjective Pt reports she has not returned becuase "some days it just hurts so bad that she cannot leave her house" Is back on medications which is helping a little bit. high stress levels at this time, not sleeping at night due to neighbor.    Patient Stated Goals descending hills, able to walk to go to classes, able to sit longer, stairs at home, into/off of busses   Currently in Pain? Yes   Pain Score 7    Pain Location Knee   Pain Orientation Right   Pain Descriptors / Indicators --  swollen   Aggravating Factors  stairs   Pain Relieving Factors slight help from medication                         [facility] Adult PT Treatment/Exercise - 06/30/16 0001      Cryotherapy   Number Minutes Cryotherapy 10 Minutes  concurrent with education   Cryotherapy Location Knee   Type of Cryotherapy Ice pack                PT Education - 06/30/16 1749    Education provided Yes   Education Details anatomy of condition, benefits of surgical intervention   Person(s) Educated Patient   Methods Explanation   Comprehension Verbalized understanding          PT Short Term Goals - 06/19/16 1051      PT SHORT TERM GOAL #1   Title Pt will verbalize compliance with and understanding of HEP as it has been established by 7/19   Time 3   Period Weeks   Status Achieved     PT SHORT TERM GOAL #2   Title Will be able to demonstrate 10 SLR on RLE without rest break   Time 3   Period Weeks   Status Achieved     PT SHORT TERM GOAL #3   Title Demonstrate heel-toe gait pattern   Baseline needs cues   Time 3   Period Weeks   Status On-going           PT Long Term Goals - 06/30/16 1752      PT LONG TERM GOAL #1   Title Pt will be able to ascend and descend stairs at home with minimal difficulty by 8/11  Status Not Met     PT LONG TERM GOAL #2   Title Pt will demonstrate verbalize improved eccentric control to descend inclines in the community, pain <5/10   Status Not Met     PT LONG TERM GOAL #3   Title Pt will report using techniques to decrease stiffness and pain in knee while seated for extended periods of time   Status Not Met     PT LONG TERM GOAL #4   Title FOTO to 42% ability (58% disability) to indicate significant improvement in functional ability   Status Not Met               Plan - 06/30/16 1750    Clinical Impression Statement Pt has not made any progress with therapy at this point and will not benefit from further treatment. Pt reported discussing TKA with MD in the past and, in my professional judgement, I believe the pt will benefit from such intervention. Pt will be d/c at this time from therapy due to lack of progress as well as insurance limitations. Was provided with HEP for which she was able to demonstrate and verbalize understanding.    PT Home Exercise Plan quad sets, hamstring stretch, ice    Consulted and Agree with Plan of Care Patient      Patient will benefit from skilled therapeutic intervention in order to improve the following deficits and impairments:     Visit Diagnosis: Pain in right knee  Muscle weakness (generalized)  Difficulty in walking, not elsewhere classified  Stiffness of right knee, not elsewhere classified     Problem List Patient Active Problem List   Diagnosis Date Noted  . Encounter for screening mammogram for breast cancer 05/15/2016  . Lumbar radiculopathy 05/15/2016  . Pain in joint, lower leg 02/28/2016  . Essential hypertension 02/14/2016  . Asthma 02/14/2016    PHYSICAL THERAPY DISCHARGE SUMMARY  Visits from Start of Care: 6  Current functional level related to goals / functional outcomes: See above   Remaining deficits: See above   Education / Equipment: Anatomy of condition, POC, HEP, exercise form/rationale  Plan: Patient agrees to discharge.  Patient goals were not met. Patient is being discharged due to lack of progress.  ?????    Katlen Seyer C. Kori Colin PT, DPT 06/30/16 5:54 PM   [facility] [address] [place], [place], [address] Phone: [phone]   Fax:  [phone]  Name: Stephanie Snyder MRN: [id number] Date of Birth: [birth year]

## 2016-06-30 NOTE — Patient Instructions (Signed)
Access Code: [email or web address]  URL: [email or web address]  Date: 06/30/2016  Prepared by: Army Fossa   Exercises  Seated Hamstring Stretch with Chair - 3 reps - 1 sets - 30 hold - 3x daily - 7x weekly  Long Sitting Quad Set - 10 reps - 3 sets - 5 hold - 3x daily - 7x weekly

## 2016-07-02 ENCOUNTER — Encounter: Payer: Medicaid Other | Admitting: Physical Therapy

## 2016-07-03 ENCOUNTER — Encounter: Payer: Medicaid Other | Admitting: Physical Therapy

## 2016-07-06 ENCOUNTER — Encounter: Payer: Medicaid Other | Admitting: Physical Therapy

## 2016-07-07 ENCOUNTER — Ambulatory Visit: Payer: Medicaid Other

## 2016-07-07 ENCOUNTER — Encounter: Payer: Self-pay | Admitting: Internal Medicine

## 2016-07-10 ENCOUNTER — Ambulatory Visit (INDEPENDENT_AMBULATORY_CARE_PROVIDER_SITE_OTHER): Payer: Medicaid Other | Admitting: Internal Medicine

## 2016-07-10 ENCOUNTER — Encounter: Payer: Medicaid Other | Admitting: Physical Therapy

## 2016-07-10 VITALS — BP 140/80 | HR 93 | Temp 98.2°F | Wt 175.5 lb

## 2016-07-10 DIAGNOSIS — I10 Essential (primary) hypertension: Secondary | ICD-10-CM | POA: Diagnosis present

## 2016-07-10 NOTE — Assessment & Plan Note (Addendum)
Patient was started on amlodipine 10 mg daily, hctz 25 mg and lisinopril 20 mg daily. She has been compliant with it. She tried to avoid excess salt intake.  Her blood pressure today is 140/80 at goal.  -continue amlodipine 10 mg, and lisinopril 20 mg and hctz 25 mg daily -BMET today -follow up in 3 months   Addendum: BMET unremarkable and Cr stable

## 2016-07-10 NOTE — Patient Instructions (Signed)
Thank you for your visit today. Your blood pressure is great today  Please check your blood pressure regularly at home. Please avoid caffeine and high salt diet  Please follow up with your PCP

## 2016-07-10 NOTE — Progress Notes (Signed)
   CC: hypertension HPI: Stephanie Snyder is a 60 y.o. woman with PMH noted below here for follow up of her hypertension  Please see Problem List/A&P for the status of the patient's chronic medical problems   Past Medical History:  Diagnosis Date  . Arthritis   . Asthma   . CVA (cerebral vascular accident) ([facility]) 2008  . Hypertension   . Irregular heart beat 2015    Review of Systems: Denies fevers, chills. Feels well Has chronic knee pain Says leg edema improved after starting hctz  Physical Exam: Vitals:   07/10/16 1010  BP: 140/80  Pulse: 93  Temp: 98.2 F (36.8 C)  TempSrc: Oral  SpO2: 100%  Weight: 175 lb 8 oz (79.6 kg)    General: A&O, in NAD CV: RRR, normal s1, s2, no m/r/g, Resp: equal and symmetric breath sounds, no wheezing or crackles  Abdomen: soft, nontender, nondistended, +BS Extremities: trace pitting edema b/l   Assessment & Plan:   See encounters tab for problem based medical decision making. Patient discussed with Dr. Heide SparkNarendra

## 2016-07-11 LAB — BMP8+ANION GAP
ANION GAP: 20 mmol/L — AB (ref 10.0–18.0)
BUN/Creatinine Ratio: 21 (ref 12–28)
BUN: 16 mg/dL (ref 8–27)
CALCIUM: 9.4 mg/dL (ref 8.7–10.3)
CO2: 21 mmol/L (ref 18–29)
CREATININE: 0.78 mg/dL (ref 0.57–1.00)
Chloride: 100 mmol/L (ref 96–106)
GFR, EST AFRICAN AMERICAN: 94 mL/min/{1.73_m2} (ref >59)
GFR, EST NON AFRICAN AMERICAN: 82 mL/min/{1.73_m2} (ref >59)
Glucose: 89 mg/dL (ref 65–99)
POTASSIUM: 3.7 mmol/L (ref 3.5–5.2)
Sodium: 141 mmol/L (ref 134–144)

## 2016-07-13 NOTE — Progress Notes (Signed)
Internal Medicine Clinic Attending  Case discussed with Dr. Saraiya at the time of the visit.  We reviewed the resident's history and exam and pertinent patient test results.  I agree with the assessment, diagnosis, and plan of care documented in the resident's note.  

## 2016-08-04 ENCOUNTER — Ambulatory Visit: Payer: Medicaid Other

## 2016-08-10 ENCOUNTER — Ambulatory Visit: Payer: Medicaid Other

## 2016-11-19 ENCOUNTER — Telehealth: Payer: Self-pay | Admitting: Internal Medicine

## 2016-11-19 NOTE — Telephone Encounter (Signed)
APT. REMINDER CALL, NO ANSWER, NO VOICEMAIL °

## 2016-11-20 ENCOUNTER — Ambulatory Visit (INDEPENDENT_AMBULATORY_CARE_PROVIDER_SITE_OTHER): Payer: Medicare Other | Admitting: Internal Medicine

## 2016-11-20 ENCOUNTER — Encounter (INDEPENDENT_AMBULATORY_CARE_PROVIDER_SITE_OTHER): Payer: Self-pay

## 2016-11-20 VITALS — BP 180/112 | HR 67 | Temp 98.3°F | Ht 62.0 in | Wt 166.9 lb

## 2016-11-20 DIAGNOSIS — Z596 Low income: Secondary | ICD-10-CM | POA: Diagnosis not present

## 2016-11-20 DIAGNOSIS — Z79899 Other long term (current) drug therapy: Secondary | ICD-10-CM | POA: Diagnosis not present

## 2016-11-20 DIAGNOSIS — Z0000 Encounter for general adult medical examination without abnormal findings: Secondary | ICD-10-CM | POA: Insufficient documentation

## 2016-11-20 DIAGNOSIS — Z72821 Inadequate sleep hygiene: Secondary | ICD-10-CM

## 2016-11-20 DIAGNOSIS — Z9114 Patient's other noncompliance with medication regimen: Secondary | ICD-10-CM | POA: Diagnosis not present

## 2016-11-20 DIAGNOSIS — I10 Essential (primary) hypertension: Secondary | ICD-10-CM | POA: Diagnosis not present

## 2016-11-20 DIAGNOSIS — Z23 Encounter for immunization: Secondary | ICD-10-CM

## 2016-11-20 DIAGNOSIS — G479 Sleep disorder, unspecified: Secondary | ICD-10-CM | POA: Insufficient documentation

## 2016-11-20 MED ORDER — LISINOPRIL-HYDROCHLOROTHIAZIDE 20-25 MG PO TABS: 1.0000 | ORAL_TABLET | Freq: Every day | ORAL | 2 refills | Status: DC

## 2016-11-20 MED ORDER — AMLODIPINE BESYLATE 10 MG PO TABS: 10.0000 mg | ORAL_TABLET | Freq: Every day | ORAL | 2 refills | Status: DC

## 2016-11-20 NOTE — Assessment & Plan Note (Signed)
Reports persistent difficulty sleeping at night and requesting Rx to help, has tried medications like melatonin without success. States that her bedroom/apartment is beneath that of a man with mental health issues who stomps around all night. Discussed with patient that other strategies should be employed first such as earplugs, white-noise/fans, or communicating with said neighbor about this activity.   Plan: - Provided above suggestions and sleepy hygiene tips - If issues persist at next visit can consider mild medications such as low-dose Trazodone or Amitriptyline at next visit

## 2016-11-20 NOTE — Patient Instructions (Signed)
Please continue to take your blood pressure medicines, exercise regularly, and avoid salty foods.  Please check your BP daily and record the results and bring this to your next visit.  To help with sleep - try using ear plugs, or use "white noise" from fans to help cover noise from noisy neighbors.  Practice Good Sleep Hygiene Here are some suggestions Avoid napping during the day. It can disturb the normal pattern of sleep and wakefulness.  Avoid stimulants such as caffeine, nicotine, and alcohol too close to bedtime. While alcohol is well known to speed the onset of sleep, it disrupts sleep in the second half as the body begins to metabolize the alcohol, causing arousal.  Exercise can promote good sleep. Vigorous exercise should be taken in the morning or late afternoon. A relaxing exercise, like yoga, can be done before bed to help initiate a restful night's sleep. Food can be disruptive right before sleep. Stay away from large meals close to bedtime. Also dietary changes can cause sleep problems, if someone is struggling with a sleep problem, it's not a good time to start experimenting with spicy dishes. And, remember, chocolate has caffeine.  Ensure adequate exposure to natural light. This is particularly important for older people who may not venture outside as frequently as children and adults. Light exposure helps maintain a healthy sleep-wake cycle.  Establish a regular relaxing bedtime routine. Try to avoid emotionally upsetting conversations and activities before trying to go to sleep. Don't dwell on, or bring your problems to bed.  Associate your bed with sleep. It's not a good idea to use your bed to watch TV, listen to the radio, or read.  Make sure that the sleep environment is pleasant and relaxing. The bed should be comfortable, the room should not be too hot or cold, or too bright.

## 2016-11-20 NOTE — Assessment & Plan Note (Signed)
BP 180/112 today, reports similar pressures at home over the last 2-3 days due to significant stress at home and medication non-compliance. Reports that her food stamps were cut/reduced and she has had to spend more on food recently and was unable to afford refilling her medications, and has not had any for the last 2 weeks. She is visiting a food pantry now and has adjusted her finances to afford them at this point, and is requesting that we fill them as a 90-day supply in the future. Asymptomatic with benign exam today.  Plan: - Refilled Lisinopril-HCTZ 20-25 mg and Amlodipine 10 mg, 90-day supply - Encouraged DASH diet, exercise, weight loss

## 2016-11-20 NOTE — Assessment & Plan Note (Signed)
Provided flu vaccine 

## 2016-11-20 NOTE — Progress Notes (Signed)
   CC: Hypertension follow up  HPI:  [name] is a 60 y.o. female with PMHx detailed below presenting for follow up of her hypertension.  See problem based assessment and plan below for additional details.  Past Medical History:  Diagnosis Date  . Arthritis   . Asthma   . CVA (cerebral vascular accident) ([facility]) 2008  . Hypertension   . Irregular heart beat 2015   Review of Systems: Review of Systems  Constitutional: Negative for chills, fever, malaise/fatigue and weight loss.  Respiratory: Negative for cough and shortness of breath.   Cardiovascular: Positive for palpitations. Negative for chest pain and leg swelling.  Gastrointestinal: Negative for abdominal pain, constipation, diarrhea, nausea and vomiting.  Psychiatric/Behavioral: Negative for depression and substance abuse. The patient is nervous/anxious and has insomnia.   All other systems reviewed and are negative.   Physical Exam: Vitals:   11/20/16 0922  BP: (!) 180/112  Pulse: 67  Temp: 98.3 F (36.8 C)  TempSrc: Oral  SpO2: 100%  Weight: 166 lb 14.4 oz (75.7 kg)  Height: 5\' 2"  (1.575 m)   Body mass index is 30.53 kg/m. GENERAL- Overweight woman sitting comfortably in exam room chair, alert, in no distress, plastic bag containing food pantry items at her feet HEENT- Atraumatic, PERRL, moist mucous membranes CARDIAC- Regular rate and rhythm, no murmurs, rubs or gallops. RESP- Clear to ascultation bilaterally, normal work of breathing ABDOMEN- Soft, nontender, nondistended EXTREMITIES- Normal bulk and range of motion, no edema, 2+ peripheral pulses SKIN- Warm, dry, intact, without visible rash PSYCH- Appropriate affect, clear speech, thoughts linear and goal-directed  Assessment & Plan:   See encounters tab for problem based medical decision making.  Patient seen with Dr. Criselda PeachesMullen

## 2016-11-25 NOTE — Progress Notes (Signed)
Internal Medicine Clinic Attending  I saw and evaluated the patient.  I personally confirmed the key portions of the history and exam documented by Dr. Johnson and I reviewed pertinent patient test results.  The assessment, diagnosis, and plan were formulated together and I agree with the documentation in the resident's note.  

## 2017-01-01 ENCOUNTER — Telehealth: Payer: Self-pay | Admitting: Internal Medicine

## 2017-01-01 NOTE — Telephone Encounter (Signed)
APT. REMINDER CALL, LMTCB °

## 2017-01-04 ENCOUNTER — Encounter (INDEPENDENT_AMBULATORY_CARE_PROVIDER_SITE_OTHER): Payer: Self-pay

## 2017-01-04 ENCOUNTER — Ambulatory Visit (INDEPENDENT_AMBULATORY_CARE_PROVIDER_SITE_OTHER): Payer: Medicare Other | Admitting: Internal Medicine

## 2017-01-04 ENCOUNTER — Encounter: Payer: Self-pay | Admitting: Internal Medicine

## 2017-01-04 VITALS — BP 113/77 | HR 85 | Temp 97.9°F | Wt 165.4 lb

## 2017-01-04 DIAGNOSIS — G4700 Insomnia, unspecified: Secondary | ICD-10-CM

## 2017-01-04 DIAGNOSIS — M25561 Pain in right knee: Secondary | ICD-10-CM

## 2017-01-04 DIAGNOSIS — Z6379 Other stressful life events affecting family and household: Secondary | ICD-10-CM | POA: Diagnosis not present

## 2017-01-04 DIAGNOSIS — I10 Essential (primary) hypertension: Secondary | ICD-10-CM

## 2017-01-04 DIAGNOSIS — Z79899 Other long term (current) drug therapy: Secondary | ICD-10-CM

## 2017-01-04 DIAGNOSIS — F439 Reaction to severe stress, unspecified: Secondary | ICD-10-CM | POA: Insufficient documentation

## 2017-01-04 MED ORDER — MELOXICAM 7.5 MG PO TABS: 7.5000 mg | ORAL_TABLET | Freq: Every day | ORAL | 0 refills | Status: DC

## 2017-01-04 MED ORDER — MELOXICAM 7.5 MG PO TABS: 7.5000 mg | ORAL_TABLET | Freq: Every day | ORAL | 2 refills | Status: DC

## 2017-01-04 NOTE — Patient Instructions (Signed)
It was great meeting you today. I'm sorry you are going through such a stressful time right now.   1. Today we talked about your knee pain. For this, I would recommend a steroid injection. For now, I am ordering you Meloxicam. It's an anti-inflammatory medication. Please take once a day. This medicine can upset your stomach so please take an acid reducer if it does.  2. Today we also talked about your stressful situations. We talked about adding an antidepressant today however decided to postpone this. Please contact the clinic and let us know if you change your mind.  3. Please come back in about 3 months!

## 2017-01-04 NOTE — Progress Notes (Signed)
   CC: follow up of hypertension, knee pain  HPI:  Stephanie Snyder is a 61 y.o. F with medical history as outlined below who presents for follow up evaluation of hypertension, insomnia and right knee pain.  HTN: Amlodipine 10 mg and lisinopril-hctz 20-25 mg daily. Reports compliance with these medications. Unable to comply with low-sodium diet due to poor financial situation.   Insomnia: Currently taking Melatonin suppl at home.   R. Knee pain: Due to tricompartmental disease as documented on radiography. Patient walks with cane and endorses significant pain currently due to weather. Patient has been taking Tylenol and Ibuprofen at home without significant relief. Has never had a steroid injection.   Stress at home: Patient lives in [address] 9 housing with her partner who reportedly does not contribute to the home. She reports her family is very far away and hasn't seen them in several years. Reports she owes a lot of money to her lawyer and is stressed with that looming over her head. In addition, she reports that the person who lives above her is up all night stomping around.   Current Outpatient Prescriptions on File Prior to Visit  Medication Sig Dispense Refill  . albuterol (PROVENTIL HFA;VENTOLIN HFA) 108 (90 Base) MCG/ACT inhaler Inhale 2 puffs into the lungs every 6 (six) hours as needed for wheezing or shortness of breath. 18 g 3  . amLODipine (NORVASC) 10 MG tablet Take 1 tablet (10 mg total) by mouth daily. 90 tablet 2  . cetirizine (ZYRTEC) 5 MG tablet Take 1 tablet (5 mg total) by mouth daily. 30 tablet 3  . lisinopril-hydrochlorothiazide (PRINZIDE,ZESTORETIC) 20-25 MG tablet Take 1 tablet by mouth daily. 90 tablet 2   No current facility-administered medications on file prior to visit.     Past Medical History:  Diagnosis Date  . Arthritis   . Asthma   . CVA (cerebral vascular accident) ([facility]) 2008  . Hypertension   . Irregular heart beat 2015    Review of Systems:   Review of Systems  Constitutional: Positive for malaise/fatigue. Negative for chills and fever.  Respiratory: Negative for cough and shortness of breath.   Cardiovascular: Negative for chest pain and palpitations.  Gastrointestinal: Negative for abdominal pain, diarrhea, nausea and vomiting.  Musculoskeletal: Positive for back pain and joint pain. Negative for falls.  Neurological: Negative for sensory change and headaches.  Psychiatric/Behavioral: Positive for depression. The patient is nervous/anxious.    Physical Exam: Physical Exam  Constitutional: She appears well-developed and well-nourished. She appears distressed.  HENT:  Head: Normocephalic and atraumatic.  Cardiovascular: Normal rate, regular rhythm and normal heart sounds.   Pulmonary/Chest: Effort normal and breath sounds normal. No respiratory distress.  Abdominal: Soft. Bowel sounds are normal. She exhibits no distension.  Psychiatric: Her mood appears anxious. Her speech is tangential. She exhibits a depressed mood. She expresses no suicidal plans and no homicidal plans.    Vitals:   01/04/17 1337  BP: 113/77  Pulse: 85  Temp: 97.9 F (36.6 C)  TempSrc: Oral  SpO2: 99%  Weight: 165 lb 6.4 oz (75 kg)   Assessment & Plan:   See Encounters Tab for problem based charting.  Patient discussed with Dr. Cleda DaubE. Hoffman

## 2017-01-15 NOTE — Assessment & Plan Note (Signed)
BP Readings from Last 3 Encounters:  01/04/17 113/77  11/20/16 (!) 180/112  07/10/16 140/80    Lab Results  Component Value Date   NA 141 07/10/2016   K 3.7 07/10/2016   CREATININE 0.78 07/10/2016    Assessment: Blood pressure control:  At goal.  Progress toward BP goal:   Already there.  Plan: Medications: Amlodipine 10 mg, Lisinopril 20 mg, HCTZ 25 mg. These will be continued.  Patient reports shes unable to comply with a low sodium diet due to poor financial situation.

## 2017-01-15 NOTE — Progress Notes (Signed)
Internal Medicine Clinic Attending  Case discussed with Dr. Molt at the time of the visit.  We reviewed the resident's history and exam and pertinent patient test results.  I agree with the assessment, diagnosis, and plan of care documented in the resident's note. 

## 2017-01-15 NOTE — Assessment & Plan Note (Signed)
Right knee pain due to tricompartmental disease. She would likely benefit from intraarticular steroid injections. Will start patient on Meloxicam today and gage response at her follow-up appointment.  -Meloxicam -Consider steroid injection in this patient with significant tricompartmental disease

## 2017-01-15 NOTE — Assessment & Plan Note (Signed)
Describes a poor housing situation and poor support at home. Also endorses significant debt as well that is contributing. She admits to feeling depressed and does clinically appear depressed however patient refused any antidepressants today. She denied any thoughts of harming herself or others.

## 2017-04-06 ENCOUNTER — Encounter: Payer: Medicare Other | Admitting: Internal Medicine

## 2017-04-13 ENCOUNTER — Emergency Department (HOSPITAL_COMMUNITY)
Admission: EM | Admit: 2017-04-13 | Discharge: 2017-04-13 | Disposition: A | Discharge status: Home / Self Care | Payer: Medicare Other | Attending: Emergency Medicine | Admitting: Emergency Medicine

## 2017-04-13 ENCOUNTER — Encounter (HOSPITAL_COMMUNITY): Payer: Self-pay | Admitting: Emergency Medicine

## 2017-04-13 DIAGNOSIS — J45909 Unspecified asthma, uncomplicated: Secondary | ICD-10-CM | POA: Insufficient documentation

## 2017-04-13 DIAGNOSIS — I10 Essential (primary) hypertension: Secondary | ICD-10-CM | POA: Insufficient documentation

## 2017-04-13 DIAGNOSIS — Z79899 Other long term (current) drug therapy: Secondary | ICD-10-CM | POA: Insufficient documentation

## 2017-04-13 DIAGNOSIS — Z760 Encounter for issue of repeat prescription: Secondary | ICD-10-CM | POA: Insufficient documentation

## 2017-04-13 DIAGNOSIS — R030 Elevated blood-pressure reading, without diagnosis of hypertension: Secondary | ICD-10-CM | POA: Diagnosis not present

## 2017-04-13 MED ORDER — AMLODIPINE BESYLATE 10 MG PO TABS: 10.0000 mg | ORAL_TABLET | Freq: Every day | ORAL | 0 refills | Status: DC

## 2017-04-13 MED ORDER — LISINOPRIL-HYDROCHLOROTHIAZIDE 20-25 MG PO TABS: 1.0000 | ORAL_TABLET | Freq: Every day | ORAL | 0 refills | Status: DC

## 2017-04-13 MED ORDER — LISINOPRIL 20 MG PO TABS
20.0000 mg | ORAL_TABLET | Freq: Once | ORAL | Status: AC
  Administered 2017-04-13: 20 mg via ORAL
  Filled 2017-04-13: qty 1

## 2017-04-13 MED ORDER — AMLODIPINE BESYLATE 5 MG PO TABS
10.0000 mg | ORAL_TABLET | Freq: Once | ORAL | Status: AC
  Administered 2017-04-13: 10 mg via ORAL
  Filled 2017-04-13: qty 2

## 2017-04-13 MED ORDER — MELOXICAM 7.5 MG PO TABS: 7.5000 mg | ORAL_TABLET | Freq: Every day | ORAL | 0 refills | Status: AC

## 2017-04-13 NOTE — Progress Notes (Signed)
CSW engaged with Patient regarding needing food resources. CSW provided Patient with emotional support and brief supportive counseling. CSW provided Patient with food banks and free meals in the local [place] area that are accessible via the bus. CSW also provided Patient with bus passes. Patient appreciative of CSW's time. CSW signing off at this time. Please consult should new need(s) arise.    Enos FlingAshley Idan Prime, MSW, LCSW [facility] ED/[facility]M Clinical Social Worker [phone]

## 2017-04-13 NOTE — ED Notes (Signed)
States she is tired stressed out , doesn't have any money and can't get her medicine states she hasn't had any for 2 weeks.

## 2017-04-13 NOTE — Discharge Planning (Signed)
[facility] regards to [facility].  Pt has insurance coverage and is not eligible for medical assistance programs. Pt may visit [facility] or [facility] for Rx needs  Pt verbalized understanding.  No further CM needs communicated at this time.

## 2017-04-13 NOTE — Discharge Instructions (Signed)
Please read and follow all provided instructions.  Your diagnoses today include:  1. Hypertension, unspecified type   2. Medication refill     Tests performed today include: Vital signs. See below for your results today.   Medications prescribed:  Take as prescribed   Home care instructions:  Follow any educational materials contained in this packet.  Follow-up instructions: Please follow-up with your primary care provider for further evaluation of symptoms and treatment   Return instructions:  Please return to the Emergency Department if you do not get better, if you get worse, or new symptoms OR  - Fever (temperature greater than 101.9F)  - Bleeding that does not stop with holding pressure to the area    -Severe pain (please note that you may be more sore the day after your accident)  - Chest Pain  - Difficulty breathing  - Severe nausea or vomiting  - Inability to tolerate food and liquids  - Passing out  - Skin becoming red around your wounds  - Change in mental status (confusion or lethargy)  - New numbness or weakness    Please return if you have any other emergent concerns.  Additional Information:  Your vital signs today were: BP (!) 154/90 (BP Location: Right Arm)    Pulse (!) 52    Temp 97.8 F (36.6 C) (Oral)    Resp 18    Ht 5\' 1"  (1.549 m)    Wt 72.6 kg    SpO2 100%    BMI 30.23 kg/m  If your blood pressure (BP) was elevated above 135/85 this visit, please have this repeated by your doctor within one month. ---------------

## 2017-04-13 NOTE — ED Triage Notes (Signed)
Per EMS: pt from home c/o htn since not having meds x 10 days due to financial issues; no complaints otherwise

## 2017-04-13 NOTE — ED Provider Notes (Signed)
[facility]EM[facility]ENCY DEPT Provider Note   CSN: [id number] Arrival date & time: 04/13/17  1051  By signing my name below, I, Stephanie Snyder, attest that this documentation has been prepared under the direction and in the presence of Lia Hoppingyler M. Tenessa Marsee, PA-C. Electronically Signed: Marnette Burgessyan Andrew Snyder, Scribe. 04/13/2017. 2:02 PM.  History   Chief Complaint Chief Complaint  Patient presents with  . Hypertension   The history is provided by the patient and medical records. No language interpreter was used.    HPI Comments:  Stephanie Snyder is a 61 y.o. female with a PMHx of Arthritis, Asthma, CVA, HTN, and Irregular Heart Beat, who presents to the Emergency Department by way of ambulance requesting a medication refill. She reports running out of her blood pressure medication two months PTA. She states this along with other life events are causing her increased stress which is exacerbating how she is feeling. She denies SI/HI but states "I am fed up with everything. I am just tired and at my breaking point." Pt has associated symptoms of sleep disturbance d/t her neighbors making noise throughout the night, back pain, chronic right knee pain, and reduced PO intake d/t cost of food. No alleviating factors noted. No CP/SOB/ABD pain. No N/V. No diaphoresis. Pt denies any other complaints at this time.   Past Medical History:  Diagnosis Date  . Arthritis   . Asthma   . CVA (cerebral vascular accident) (HCC) 2008  . Hypertension   . Irregular heart beat 2015   Patient Active Problem List   Diagnosis Date Noted  . Stress at home 01/04/2017  . Healthcare maintenance 11/20/2016  . Difficulty sleeping 11/20/2016  . Encounter for screening mammogram for breast cancer 05/15/2016  . Lumbar radiculopathy 05/15/2016  . Pain in joint, lower leg 02/28/2016  . Essential hypertension 02/14/2016  . Asthma 02/14/2016   Past Surgical History:  Procedure Laterality Date  . TUBAL LIGATION  1984   OB History    No data available     Home Medications    Prior to Admission medications   Medication Sig Start Date End Date Taking? Authorizing Provider  albuterol (PROVENTIL HFA;VENTOLIN HFA) 108 (90 Base) MCG/ACT inhaler Inhale 2 puffs into the lungs every 6 (six) hours as needed for wheezing or shortness of breath. 06/26/16   Thomasene Lotaylor, James, MD  amLODipine (NORVASC) 10 MG tablet Take 1 tablet (10 mg total) by mouth daily. 11/20/16   Althia FortsJohnson, Adam, MD  cetirizine (ZYRTEC) 5 MG tablet Take 1 tablet (5 mg total) by mouth daily. 02/14/16   Jana Halfaylor, Nicholas A, MD  lisinopril-hydrochlorothiazide (PRINZIDE,ZESTORETIC) 20-25 MG tablet Take 1 tablet by mouth daily. 11/20/16   Althia FortsJohnson, Adam, MD  meloxicam (MOBIC) 7.5 MG tablet Take 1 tablet (7.5 mg total) by mouth daily. 01/04/17 01/04/18  Molt, Toma CopierBethany, DO    Family History Family History  Problem Relation Age of Onset  . Cancer Father     Social History Social History  Substance Use Topics  . Smoking status: Never Smoker  . Smokeless tobacco: Never Used  . Alcohol use No   Allergies   Latex   Review of Systems Review of Systems  Musculoskeletal: Positive for arthralgias and back pain.  Psychiatric/Behavioral: Positive for sleep disturbance. The patient is nervous/anxious.   All systems reviewed and are negative for acute change except as noted in the HPI.   Physical Exam Updated Vital Signs BP (!) 154/90 (BP Location: Right Arm)   Pulse (!) 52   Temp 97.8  F (36.6 C) (Oral)   Resp 18   Ht 5\' 1"  (1.549 m)   Wt 160 lb (72.6 kg)   SpO2 100%   BMI 30.23 kg/m   Physical Exam  Constitutional: She is oriented to person, place, and time. She appears well-developed and well-nourished. No distress.  HENT:  Head: Normocephalic and atraumatic.  Right Ear: Tympanic membrane, external ear and ear canal normal.  Left Ear: Tympanic membrane, external ear and ear canal normal.  Nose: Nose normal.  Mouth/Throat: Uvula is midline, oropharynx is clear  and moist and mucous membranes are normal. No trismus in the jaw. No oropharyngeal exudate, posterior oropharyngeal erythema or tonsillar abscesses.  Eyes: Conjunctivae and EOM are normal. Pupils are equal, round, and reactive to light.  Neck: Normal range of motion. Neck supple. No tracheal deviation present.  Cardiovascular: Normal rate, regular rhythm, S1 normal, S2 normal, normal heart sounds, intact distal pulses and normal pulses.   Pulmonary/Chest: Effort normal and breath sounds normal. No respiratory distress. She has no decreased breath sounds. She has no wheezes. She has no rhonchi. She has no rales.  Abdominal: Normal appearance and bowel sounds are normal. She exhibits no distension. There is no tenderness.  Musculoskeletal: Normal range of motion.  Neurological: She is alert and oriented to person, place, and time.  Skin: Skin is warm and dry.  Psychiatric: She has a normal mood and affect. Her speech is normal and behavior is normal. Thought content normal.  Nursing note and vitals reviewed.   ED Treatments / Results  DIAGNOSTIC STUDIES:  Oxygen Saturation is 100% on RA, normal by my interpretation.    COORDINATION OF CARE:  2:01 PM Discussed treatment plan with pt at bedside including work with case management, pain medication, and ACE Inhibitor and pt agreed to plan.  Labs (all labs ordered are listed, but only abnormal results are displayed) Labs Reviewed - No data to display  EKG  EKG Interpretation None       Radiology No results found.  Procedures Procedures (including critical care time)  Medications Ordered in ED Medications - No data to display   Initial Impression / Assessment and Plan / ED Course  I have reviewed the triage vital signs and the nursing notes.  Pertinent labs & imaging results that were available during my care of the patient were reviewed by me and considered in my medical decision making (see chart for details).  Final  Clinical Impressions(s) / ED Diagnoses      {I have reviewed the relevant previous healthcare records.  {I obtained HPI from historian.   ED Course:  Assessment: Patient noted to be hypertensive in the emergency department.  Unable to refill meds due to cost. No symptoms currently. No N/V. No CP/SOB. No signs of hypertensive urgency.  Discussed with patient the need for close follow-up and management by their primary care physician. Consulted Case management and social work for medication refill assistance and close follow up to PCP.    Disposition/Plan:  DC Home Additional Verbal discharge instructions given and discussed with patient.  Pt Instructed to f/u with PCP in the next week for evaluation and treatment of symptoms. Return precautions given Pt acknowledges and agrees with plan  Supervising Physician Raeford Razor, MD  Final diagnoses:  Hypertension, unspecified type  Medication refill    New Prescriptions New Prescriptions   No medications on file    I personally performed the services described in this documentation, which was scribed in my presence.  The recorded information has been reviewed and is accurate.    Audry Pili, PA-C 04/13/17 1448    Raeford Razor, MD 04/13/17 [phone]

## 2017-05-25 ENCOUNTER — Encounter: Payer: Medicare Other | Admitting: Internal Medicine

## 2017-06-08 ENCOUNTER — Encounter: Payer: Medicare Other | Admitting: Internal Medicine

## 2017-06-08 ENCOUNTER — Encounter: Payer: Self-pay | Admitting: Internal Medicine

## 2017-06-22 ENCOUNTER — Ambulatory Visit (INDEPENDENT_AMBULATORY_CARE_PROVIDER_SITE_OTHER): Payer: Medicare Other | Admitting: Internal Medicine

## 2017-06-22 ENCOUNTER — Encounter: Payer: Self-pay | Admitting: Internal Medicine

## 2017-06-22 VITALS — BP 153/78 | HR 81 | Temp 98.1°F | Wt 155.0 lb

## 2017-06-22 DIAGNOSIS — I10 Essential (primary) hypertension: Secondary | ICD-10-CM | POA: Diagnosis not present

## 2017-06-22 DIAGNOSIS — M25461 Effusion, right knee: Secondary | ICD-10-CM | POA: Diagnosis not present

## 2017-06-22 DIAGNOSIS — E669 Obesity, unspecified: Secondary | ICD-10-CM

## 2017-06-22 DIAGNOSIS — Z8673 Personal history of transient ischemic attack (TIA), and cerebral infarction without residual deficits: Secondary | ICD-10-CM

## 2017-06-22 DIAGNOSIS — Z79899 Other long term (current) drug therapy: Secondary | ICD-10-CM | POA: Diagnosis not present

## 2017-06-22 DIAGNOSIS — M1711 Unilateral primary osteoarthritis, right knee: Secondary | ICD-10-CM | POA: Diagnosis not present

## 2017-06-22 DIAGNOSIS — Z1231 Encounter for screening mammogram for malignant neoplasm of breast: Secondary | ICD-10-CM

## 2017-06-22 DIAGNOSIS — Z6829 Body mass index (BMI) 29.0-29.9, adult: Secondary | ICD-10-CM | POA: Diagnosis not present

## 2017-06-22 DIAGNOSIS — M1710 Unilateral primary osteoarthritis, unspecified knee: Secondary | ICD-10-CM | POA: Insufficient documentation

## 2017-06-22 MED ORDER — MELOXICAM 7.5 MG PO TABS: 7.5000 mg | ORAL_TABLET | Freq: Every day | ORAL | 5 refills | Status: DC

## 2017-06-22 MED ORDER — AMLODIPINE BESYLATE 10 MG PO TABS: 10.0000 mg | ORAL_TABLET | Freq: Every day | ORAL | 11 refills | Status: DC

## 2017-06-22 MED ORDER — LISINOPRIL-HYDROCHLOROTHIAZIDE 20-25 MG PO TABS: 1.0000 | ORAL_TABLET | Freq: Every day | ORAL | 11 refills | Status: DC

## 2017-06-22 NOTE — Assessment & Plan Note (Signed)
As demonstrated on X-rays from 04/2016 which were independently reviewed by myself. Had some improvement with meloxicam however she remains quite limited secondary to her severe OA. Believe patient would benefit from trial of steroid injection.  -Performed injection today -Consider repeat injection in 3 months should patient find relief -Consider referral for orthopaedic evaluation however patient seems uninterested in replacement at this time -Rx for Meloxicam sent   PROCEDURE NOTE Ultrasound was utilized to assist with visualization, no significant effusion was appreciated. After consent was obtained, using sterile technique the right knee was prepped. Using a 1.5" 22-gauge needle, 40 mg Triamcinolone and 2 ml plain Lidocaine was then injected. The procedure was well tolerated and was without immediate complications. The patient is asked to continue to rest the knee for several days before resuming regular activities.  It may be more painful for the first 1-2 days.  Watch for fever, or increased swelling or persistent pain in knee. Call or return to clinic prn if such symptoms occur or the knee fails to improve as anticipated.

## 2017-06-22 NOTE — Assessment & Plan Note (Signed)
Ordered screening mammo. Encouraged to get colonoscopy and PAP however not interested in these. Will continue to encourage patient

## 2017-06-22 NOTE — Patient Instructions (Addendum)
It was a pleasure seeing you today!  Today we talked about your blood pressure. It is important that you take your medications consistently. I have sent in refills of your medications to your pharmacy. Please take Amlodipine, Lisinopril and Hydrochlorothiazide daily.   Today we also talked about your knee osteoarthritis. Today we performed a steroid injection in your knee which I hope will help your pain. I am also refilling your Meloxicam as well.   Today we talked about health maintenance exams. You need a mammogram and colonoscopy. I have referred you for mammogram.   Please come back in 1 month to make sure your blood pressure is controlled and to get up to date on more important screening.

## 2017-06-22 NOTE — Assessment & Plan Note (Signed)
Non-compliant with Amlodipine 10 mg, Lisinopril 20mg  and HCTZ 25 mg daily. BP subsequently 150/70 today. Was seen in ED 03/2017 for medication refill and was given 1 month supply. She brought these bottles to her visit today which were only half full. Good conversation was had today on importance of medication compliance! States she would be compliant if she had a lot of refills -Encouraged strict compliance -Sent in Amlodipine 10 mg, Lisinopril 20 mg and HCTZ 25 mg daily with 11 refills -Patient to return in 1 month for BP recheck

## 2017-06-22 NOTE — Progress Notes (Signed)
   CC: follow-up of of HTN, right knee OA.   HPI:  Ms.Vaunda Tanna SavoyBarrie is a 61 y.o. F with medial history as outlined below who presents for follow up of these conditions.   HTN: On Lisinopril-HCTZ 20-25mg  & Amlodipine 10mg  daily. Seen in ED in May for medication refill after being out for 2 months (unable to afford) at which time BP 150/90. Given a 1 month supply with no refills upon dc from ED. Hasn't taken BP meds in several weeks.   Right Knee OA: Significant tricompartmental dx noted on prior films. Given trial of Meloxicam which provided some relief. Never had steroid injection before.   HCM: She requires Mammogram, colonoscopy/stool cards, PAP, Hep C and HIV screens to be UTD in health maintenance screens.   Past Medical History:  Diagnosis Date  . Arthritis   . Asthma   . CVA (cerebral vascular accident) (HCC) 2008  . Hypertension   . Irregular heart beat 2015   Review of Systems:   General: Denies fevers, chills, weight loss, fatigue HEENT: Denies changes in vision, sore throat, dysphagia Cardiac: Denies CP, SOB, palpitations Pulmonary: Denies cough, wheezes, PND Abd: Denies diarrhea, constipation, changes in bowels Extremities: Denies weakness, falls  Physical Exam: General: Alert, in no acute distress. Pleasant and conversant HEENT: No icterus, injection or ptosis. No hoarseness or dysarthria  Cardiac: RRR, no MGR appreciated Pulmonary: CTA BL with normal WOB on RA. Able to speak in complete sentences Abd: Soft, non-tended. +bs Extremities: Warm, perfused. Right knee swollen but no erythema. +Anterior crepitus. No significant joint line tenderness. No ligament laxity and minimal pain with medial meniscus testing. Walks with prominent limp. Left knee wnl.   Vitals:   06/22/17 1506  BP: (!) 153/78  Pulse: 81  Temp: 98.1 F (36.7 C)  TempSrc: Oral  SpO2: 99%  Weight: 155 lb (70.3 kg)   Assessment & Plan:   See Encounters Tab for problem based  charting.  Patient seen with Dr. Oswaldo DoneVincent

## 2017-06-24 NOTE — Progress Notes (Signed)
Internal Medicine Clinic Attending  I saw and evaluated the patient.  I personally confirmed the key portions of the history and exam documented by Dr. Vincente LibertyMolt and I reviewed pertinent patient test results.  The assessment, diagnosis, and plan were formulated together and I agree with the documentation in the resident's note. I was present during the entirety of the procedure.   Obese woman with right knee osteoarthritis with progressive pain, now using a cane to ambulate. On exam she has bony hypertrophy of the knee with a valgus deformity. There is anterior crepitus below the patella. She has no joint laxity. She has medial joint line pain with McMurray testing. On POCUS of the knee she has a very small suprapatellar effusion, not really large enough to sample. Given the extent of disease, I think she will have progressive pain and disability from this problem, and would have the best results with knee replacement. However, she is not currently interested in surgery and would like to continue with conservative management. We provided a steroid injection into the right knee today to see if that will give her some improvement in mobility.   Long axis view of the right medial suprapatellar bursa showing a very small effusion. This space was injected with steroid.

## 2017-07-27 ENCOUNTER — Encounter: Payer: Self-pay | Admitting: Internal Medicine

## 2017-07-27 ENCOUNTER — Ambulatory Visit (INDEPENDENT_AMBULATORY_CARE_PROVIDER_SITE_OTHER): Payer: Medicare Other | Admitting: Internal Medicine

## 2017-07-27 VITALS — BP 120/80 | HR 80 | Temp 98.3°F | Ht 62.0 in | Wt 146.9 lb

## 2017-07-27 DIAGNOSIS — Z79899 Other long term (current) drug therapy: Secondary | ICD-10-CM | POA: Diagnosis not present

## 2017-07-27 DIAGNOSIS — M1711 Unilateral primary osteoarthritis, right knee: Secondary | ICD-10-CM

## 2017-07-27 DIAGNOSIS — Z0000 Encounter for general adult medical examination without abnormal findings: Secondary | ICD-10-CM

## 2017-07-27 DIAGNOSIS — I10 Essential (primary) hypertension: Secondary | ICD-10-CM | POA: Diagnosis present

## 2017-07-27 MED ORDER — AMLODIPINE BESYLATE 10 MG PO TABS: 10.0000 mg | ORAL_TABLET | Freq: Every day | ORAL | 11 refills | Status: DC

## 2017-07-27 MED ORDER — LISINOPRIL-HYDROCHLOROTHIAZIDE 20-25 MG PO TABS: 1.0000 | ORAL_TABLET | Freq: Every day | ORAL | 11 refills | Status: DC

## 2017-07-27 NOTE — Patient Instructions (Addendum)
It was great seeing you today! I'm glad you're doing OK.   Today we talked about your blood pressure. Please continue taking Amlodipine 10 mg, Lisinopril-HCTZ 20-25 mg.   Today we also talked about your knee pain. I'm glad the injection helped!   Come back in see me in 3 months, or sooner if needed! I WILL need lab work from you at that visit.

## 2017-07-27 NOTE — Progress Notes (Signed)
   CC: follow-up of of HTN, right knee OA.   HPI:  Stephanie Snyder is a 61 y.o. F with medial history as outlined below who presents for follow up of these conditions.   HTN: On Lisinopril-HCTZ 20-25mg  & Amlodipine 10mg  daily. At last visit 1 month ago had been out of these medications and was subsequently hypertensive to 150. BP today 145/75 initially however 120/80 on repeat.   Right Knee OA: Significant tricompartmental dx noted on prior films. Given trial of Meloxicam which provided some relief. Had steroid injection 1 month ago with significant relief.    HCM: Ordered mammo but not completed. She requires Mammogram, colonoscopy/stool cards, PAP, Hep C and HIV screens to be UTD in health maintenance screens however refuses despite continued encouragement.   Past Medical History:  Diagnosis Date  . Arthritis   . Asthma   . CVA (cerebral vascular accident) (HCC) 2008  . Hypertension   . Irregular heart beat 2015   Review of Systems:   General: Denies fevers, chills, weight loss, fatigue HEENT: Denies changes in vision, sore throat, dysphagia Cardiac: Denies CP, SOB, palpitations Pulmonary: Denies cough, wheezes, PND Abd: Denies diarrhea, constipation, changes in bowels Extremities: Denies weakness, falls  Physical Exam: General: Alert, in no acute distress. Pleasant and conversant HEENT: No icterus, injection or ptosis. No hoarseness or dysarthria  Cardiac: RRR, no MGR appreciated Pulmonary: CTA BL with normal WOB on RA. Able to speak in complete sentences Abd: Soft, non-tended. +bs Extremities: Warm, perfused. Right knee with MILD swelling however no erythema or warmth. Minimal TTP medial and lateral joint lines. Left knee WNL. Gait improved, uses cane occasionally.   Vitals:   07/27/17 1340 07/27/17 1405  BP: (!) 145/75 120/80  Pulse: 80 80  Temp: 98.3 F (36.8 C)   TempSrc: Oral   SpO2: 100%   Weight: 146 lb 14.4 oz (66.6 kg)   Height: 5\' 2"  (1.575 m)     Assessment & Plan:   See Encounters Tab for problem based charting.  Patient discussed with Dr. Cleda Daub

## 2017-07-27 NOTE — Assessment & Plan Note (Addendum)
BP 145/75 initially today and 120/80 on repeat. Goal <140/90. Compliant with medications (Amlodipine 10 mg, HCTZ 25mg  and Lisinopril 20 mg). Last renal function check 1 year ago and wanted to check renal function at todays visit however she declined any lab work. Gave no specific reason other than she didn't want to have her blood drawn today and wanted to catch the bus on time. Historically has had normal/stable renal function. -She WILL require BMET at her next visit, scheduled for 3 months. If she declines, will need to discuss discontinuing medications.

## 2017-07-27 NOTE — Assessment & Plan Note (Signed)
Screening mammo ordered at last visit however not yet completed. Encouraged screen today. I also offered stool cards vs colonoscopy as well as PAP however she again declined. Counseled on importance of these screening tests but isn't interested.

## 2017-07-27 NOTE — Assessment & Plan Note (Addendum)
Pain much improved after injection 1 month ago! Takes meloxicam or tylenol occasionally now. Gait improved and walks occasionally with a cane. Still has issues going up and down stairs but improved.

## 2017-08-02 NOTE — Progress Notes (Signed)
Internal Medicine Clinic Attending  Case discussed with Dr. Molt at the time of the visit.  We reviewed the resident's history and exam and pertinent patient test results.  I agree with the assessment, diagnosis, and plan of care documented in the resident's note. 

## 2018-02-01 ENCOUNTER — Other Ambulatory Visit: Payer: Self-pay

## 2018-02-01 ENCOUNTER — Encounter: Payer: Medicare Other | Admitting: Internal Medicine

## 2018-02-01 DIAGNOSIS — J4520 Mild intermittent asthma, uncomplicated: Secondary | ICD-10-CM

## 2018-02-01 NOTE — Telephone Encounter (Signed)
albuterol (PROVENTIL HFA;VENTOLIN HFA) 108 (90 Base) MCG/ACT inhaler, refill request @ [facility].

## 2018-02-07 MED ORDER — ALBUTEROL SULFATE HFA 108 (90 BASE) MCG/ACT IN AERS: 2.0000 | INHALATION_SPRAY | Freq: Four times a day (QID) | RESPIRATORY_TRACT | 3 refills | Status: DC | PRN

## 2018-02-08 ENCOUNTER — Encounter: Payer: Medicare Other | Admitting: Internal Medicine

## 2018-02-08 ENCOUNTER — Encounter: Payer: Self-pay | Admitting: Internal Medicine

## 2018-02-28 ENCOUNTER — Ambulatory Visit: Payer: Medicare Other

## 2018-03-02 ENCOUNTER — Encounter (INDEPENDENT_AMBULATORY_CARE_PROVIDER_SITE_OTHER): Payer: Self-pay

## 2018-03-02 ENCOUNTER — Ambulatory Visit (INDEPENDENT_AMBULATORY_CARE_PROVIDER_SITE_OTHER): Payer: Medicare Other | Admitting: Internal Medicine

## 2018-03-02 ENCOUNTER — Other Ambulatory Visit: Payer: Self-pay

## 2018-03-02 VITALS — BP 146/81 | HR 72 | Temp 98.0°F | Ht 62.0 in | Wt 140.6 lb

## 2018-03-02 DIAGNOSIS — M1712 Unilateral primary osteoarthritis, left knee: Secondary | ICD-10-CM | POA: Diagnosis not present

## 2018-03-02 DIAGNOSIS — Z6825 Body mass index (BMI) 25.0-25.9, adult: Secondary | ICD-10-CM

## 2018-03-02 DIAGNOSIS — Z114 Encounter for screening for human immunodeficiency virus [HIV]: Secondary | ICD-10-CM

## 2018-03-02 DIAGNOSIS — F439 Reaction to severe stress, unspecified: Secondary | ICD-10-CM

## 2018-03-02 DIAGNOSIS — Z791 Long term (current) use of non-steroidal anti-inflammatories (NSAID): Secondary | ICD-10-CM

## 2018-03-02 DIAGNOSIS — Z0000 Encounter for general adult medical examination without abnormal findings: Secondary | ICD-10-CM

## 2018-03-02 DIAGNOSIS — Z598 Other problems related to housing and economic circumstances: Secondary | ICD-10-CM | POA: Diagnosis not present

## 2018-03-02 DIAGNOSIS — Z8673 Personal history of transient ischemic attack (TIA), and cerebral infarction without residual deficits: Secondary | ICD-10-CM

## 2018-03-02 DIAGNOSIS — I10 Essential (primary) hypertension: Secondary | ICD-10-CM | POA: Diagnosis not present

## 2018-03-02 DIAGNOSIS — G8929 Other chronic pain: Secondary | ICD-10-CM

## 2018-03-02 DIAGNOSIS — Z79899 Other long term (current) drug therapy: Secondary | ICD-10-CM | POA: Diagnosis not present

## 2018-03-02 DIAGNOSIS — M1711 Unilateral primary osteoarthritis, right knee: Secondary | ICD-10-CM

## 2018-03-02 DIAGNOSIS — R634 Abnormal weight loss: Secondary | ICD-10-CM

## 2018-03-02 DIAGNOSIS — Z608 Other problems related to social environment: Secondary | ICD-10-CM | POA: Diagnosis not present

## 2018-03-02 MED ORDER — AMLODIPINE BESYLATE 10 MG PO TABS: 10.0000 mg | ORAL_TABLET | Freq: Every day | ORAL | 2 refills | Status: DC

## 2018-03-02 MED ORDER — LISINOPRIL-HYDROCHLOROTHIAZIDE 20-25 MG PO TABS: 1.0000 | ORAL_TABLET | Freq: Every day | ORAL | 2 refills | Status: DC

## 2018-03-02 MED ORDER — MELOXICAM 7.5 MG PO TABS: 7.5000 mg | ORAL_TABLET | Freq: Every day | ORAL | 5 refills | Status: DC | PRN

## 2018-03-02 NOTE — Patient Instructions (Addendum)
It was good seeing you today. I'm sorry to hear about your stressors at home. Today we talked about some solutions including meals-on-wheels and also referral to [facility].   Today we talked about your blood pressure. It was elevated today in clinic. Like we discussed, high blood pressure can damage your kidneys, eyes and also put you at risk for heart attack or stroke. I have ensured you have plenty of refills and also sent in a 90-day supply for these medications.  Today we also talked about your chronic pain from arthritis. Unfortunately I cannot take all of your pain away, but I hope we can make it more manageable for you. Please take Meloxicam 7.5mg  once daily as needed.   I will be checking several labs today, including monitoring your renal function. I will call you with any abnormal results.   I have provided you with information on a program here in [place] called [facility]. [facility] is an all-inclusive program for older adults. They have many home health services (home healthcare, meals, adapting the home to accommodate any physical limitations), a Day-Center for daily activities, exercise, meals/snacks, personal care, social work and also a physician who is available each day. They also have other services as well. I think you might be a good candidate for [facility] and I encourage you to contact them for more information.

## 2018-03-02 NOTE — Assessment & Plan Note (Signed)
Pain improved with Meloxicam 7.5mg  daily prn. She expressed frustration that it did not completely eliminate her symptoms, but we discussed realistic expectations in regards to pain management and that our goal is to improve her functional status.  -Checking renal function today -Refill given of Meloxicam

## 2018-03-02 NOTE — Progress Notes (Signed)
   CC: follow-up of HTN, social issues  HPI:  [name] is a 62 y.o. F with HTN, advanced arthritis of knee and self-reported history of CVA who is here to follow-up her chronic medical conditions. She also has several social issues which she wanted to discuss at length today. Please see problem-based charting for details regarding assessment and plan.   Past Medical History:  Diagnosis Date  . Arthritis   . Asthma   . CVA (cerebral vascular accident) (HCC) 2008  . Hypertension   . Irregular heart beat 2015   Review of Systems:   General: +Fatigue. +Weight loss (inconsistent access to food). Denies fevers, chills HEENT: Denies changes in vision, sore throat, dysphagia Cardiac: Denies CP, palpitations Pulmonary: Denies cough, wheezes Abd: Denies changes in bowels, hematochezia or melena Extremities: +LLE pain. No swelling  Physical Exam: General: Alert, conversant. Tangential speech, re-directs any conversation back to her social issues HEENT: No icterus, injection or ptosis. No hoarseness or dysarthria  Cardiac: RRR, no MGR appreciated Pulmonary: CTA BL with normal WOB on RA. Able to speak in complete sentences Abd: Soft, non-tender. +bs Extremities: Warm, perfused. No significant pedal edema. Walks with cane.  Vitals:   03/02/18 1347  BP: (!) 146/81  Pulse: 72  Temp: 98 F (36.7 C)  TempSrc: Oral  SpO2: 100%  Weight: 140 lb 9.6 oz (63.8 kg)  Height: 5\' 2"  (1.575 m)   Assessment & Plan:   See Encounters Tab for problem based charting.  Patient discussed with Dr. Rogelia BogaButcher

## 2018-03-02 NOTE — Assessment & Plan Note (Signed)
Have included HIV and Hepatitis C antibodies on todays lab-draw. She is due for a colonoscopy and PAP, but this will continue to be addressed at future visits.

## 2018-03-02 NOTE — Assessment & Plan Note (Signed)
[name] is prescribed Amlodipine 10mg , Lisinopril-HCTZ 20-25mg  for her essential HTN. She is hypertensive today in clinic and review of medication bottles show her last fill was in early February. Despite this, she notes compliance with her medications. She states she is able to afford these medications and is tolerating them well.  -Continue Amlodipine 10mg  -Continue Lisinopril-HCTZ 20-25mg  -Have obtained renal function to evaluate Cr and electrolytes

## 2018-03-02 NOTE — Assessment & Plan Note (Signed)
Patient due for HIV screen. Ordered today, will follow.

## 2018-03-02 NOTE — Assessment & Plan Note (Signed)
[name] continues to describe significant stress regarding her social situation. She lives with her husband but does not feel that she gets any support at home from him. She mentions significant financial constraints, and has went without food recently for several days. She also describes frustration with lack of transportation and issues with mobility due to her knee osteoarthrtitis.  Patient was provided with groceries today, which should help her over the next few days. I also provided her information on different Food Pantry's in the area, as well as Meals-on-Wheels. We spent a lot of time discussing the possibility of [facility]. I encouraged her to contact them as I believe she would benefit from this comprehensive program. I've also referred her to [facility] for continued assistance.  [facility] referral -Groceries provided -Information given on free meals around [place] -[facility] discussed, patient is considering reaching out

## 2018-03-03 ENCOUNTER — Encounter: Payer: Self-pay | Admitting: *Deleted

## 2018-03-03 ENCOUNTER — Other Ambulatory Visit: Payer: Self-pay | Admitting: *Deleted

## 2018-03-03 LAB — CMP14 + ANION GAP
ALK PHOS: 76 IU/L (ref 39–117)
ALT: 10 IU/L (ref 0–32)
AST: 19 IU/L (ref 0–40)
Albumin/Globulin Ratio: 1.2 (ref 1.2–2.2)
Albumin: 4.1 g/dL (ref 3.6–4.8)
Anion Gap: 15 mmol/L (ref 10.0–18.0)
BILIRUBIN TOTAL: 0.5 mg/dL (ref 0.0–1.2)
BUN/Creatinine Ratio: 8 — ABNORMAL LOW (ref 12–28)
BUN: 6 mg/dL — ABNORMAL LOW (ref 8–27)
CHLORIDE: 103 mmol/L (ref 96–106)
CO2: 25 mmol/L (ref 20–29)
Calcium: 9.3 mg/dL (ref 8.7–10.3)
Creatinine, Ser: 0.77 mg/dL (ref 0.57–1.00)
GFR calc Af Amer: 95 mL/min/{1.73_m2} (ref >59)
GFR calc non Af Amer: 82 mL/min/{1.73_m2} (ref >59)
Globulin, Total: 3.3 g/dL (ref 1.5–4.5)
Glucose: 80 mg/dL (ref 65–99)
POTASSIUM: 3.7 mmol/L (ref 3.5–5.2)
Sodium: 143 mmol/L (ref 134–144)
TOTAL PROTEIN: 7.4 g/dL (ref 6.0–8.5)

## 2018-03-03 LAB — HEPATITIS C ANTIBODY: Hep C Virus Ab: 0.2 s/co ratio (ref 0.0–0.9)

## 2018-03-03 LAB — HIV ANTIBODY (ROUTINE TESTING W REFLEX): HIV SCREEN 4TH GENERATION: NONREACTIVE

## 2018-03-03 NOTE — Patient Outreach (Signed)
[facility] Care Management  03/03/2018  Sung AmabileCharlotte Caserta 01/15/1956 [id number]   Referral Received : 03/02/18 Referral source:[facility], Dr.Molt Referral reason : chronic Hypertension and pain  Insurance: Medicare  Unsuccessful outreach call to patient , no answer able to leave a HIPAA compliant message with return call number.    Plan Will send unsuccessful outreach letter.  Message sent to RN care coordinator coverage regarding follow up call in the next 3 to 4 business days.    Egbert GaribaldiKimberly Hilery Wintle, RN, [facility] Care Management,Care Management Coordinator  [phone]- Mobile [phone]- Toll Free Main Office

## 2018-03-03 NOTE — Progress Notes (Signed)
Internal Medicine Clinic Attending  Case discussed with Dr. Molt at the time of the visit.  We reviewed the resident's history and exam and pertinent patient test results.  I agree with the assessment, diagnosis, and plan of care documented in the resident's note. 

## 2018-03-07 ENCOUNTER — Other Ambulatory Visit: Payer: Self-pay

## 2018-03-07 NOTE — Patient Outreach (Signed)
Telephone assessment:  Placed call to patient. No answer. Left generic message and requested patient call back. Provided my name and phone number.  PLAN: will await a call back and will update assigned case manager.  Rowe PavyAmanda Cook, RN, BSN, CEN [facility] Care Coordinator [phone]

## 2018-03-11 ENCOUNTER — Other Ambulatory Visit: Payer: Self-pay | Admitting: *Deleted

## 2018-03-11 NOTE — Patient Outreach (Signed)
[facility]) Care Management  03/11/2018  Stephanie AmabileCharlotte Snyder 11/07/1956 [id number]  Day # 7, call attempt #3  Referral Received : 03/02/18 Referral source:[facility], Dr.Molt Referral reason : chronic Hypertension and pain  Insurance: Medicare  Unsuccessful outreach call to patient , no answer unable to leave a message for return call attempted call x 2.  No return  response from outreach letter sent yet.   Plan  Will await return call if no response will plan case closure on day #10 of outreach attempts .  Egbert GaribaldiKimberly Glover, RN, [facility] Care Management,Care Management Coordinator  [phone]- Mobile [phone]- Toll Free Main Office

## 2018-03-16 ENCOUNTER — Other Ambulatory Visit: Payer: Self-pay | Admitting: *Deleted

## 2018-03-16 NOTE — Patient Outreach (Addendum)
[facility]) Care Management  03/16/2018  [name] 07/18/1956 [id number]   Referral Received : 03/02/18 Referral source:[facility], Dr.Molt Referral reason : chronic Hypertension and pain  Insurance: Medicare  Unsuccessful contact with patient after 3 phone call attempts in 10 days , no response from outreach letter sent.   Case will be closed per workflow, unable to make contact with patient  and marked as not active.   Egbert GaribaldiKimberly Glover, RN, [facility] Care Management,Care Management Coordinator  [phone]- Mobile [phone]- Toll Free Main Office

## 2018-03-16 NOTE — Addendum Note (Signed)
Addended by: Dorie RankPOWERS, Mc Bloodworth E on: 03/16/2018 06:30 PM   Modules accepted: Orders

## 2018-04-05 ENCOUNTER — Encounter: Payer: Medicare Other | Admitting: Internal Medicine

## 2018-04-26 ENCOUNTER — Other Ambulatory Visit: Payer: Self-pay

## 2018-04-26 DIAGNOSIS — J4520 Mild intermittent asthma, uncomplicated: Secondary | ICD-10-CM

## 2018-04-26 NOTE — Telephone Encounter (Signed)
Pt called back, states she do not need the meloxicam to be filled. She just need the inhaler to be filled.

## 2018-04-26 NOTE — Telephone Encounter (Signed)
albuterol (PROVENTIL HFA;VENTOLIN HFA) 108 (90 Base) MCG/ACT inhaler,   meloxicam (MOBIC) 7.5 MG tablet   Refill request @ [facility].

## 2018-04-26 NOTE — Telephone Encounter (Signed)
Lm for pt to call back, script for albuterol is at [facility]

## 2018-05-02 NOTE — Telephone Encounter (Signed)
Lm for rtc 

## 2018-05-11 ENCOUNTER — Ambulatory Visit (HOSPITAL_COMMUNITY): Payer: Medicare Other | Admitting: Psychiatry

## 2018-05-17 NOTE — Addendum Note (Signed)
Addended by: Neomia DearPOWERS, Luccia Reinheimer E on: 05/17/2018 07:47 PM   Modules accepted: Orders

## 2018-05-25 ENCOUNTER — Encounter: Payer: Medicare Other | Admitting: Internal Medicine

## 2018-05-25 ENCOUNTER — Encounter: Payer: Self-pay | Admitting: Internal Medicine

## 2018-06-24 ENCOUNTER — Ambulatory Visit: Payer: Medicare Other

## 2018-06-29 ENCOUNTER — Ambulatory Visit: Payer: Medicare Other

## 2018-08-10 ENCOUNTER — Ambulatory Visit (INDEPENDENT_AMBULATORY_CARE_PROVIDER_SITE_OTHER): Payer: Medicare Other | Admitting: Internal Medicine

## 2018-08-10 ENCOUNTER — Encounter (INDEPENDENT_AMBULATORY_CARE_PROVIDER_SITE_OTHER): Payer: Self-pay

## 2018-08-10 DIAGNOSIS — M79604 Pain in right leg: Secondary | ICD-10-CM | POA: Diagnosis not present

## 2018-08-10 DIAGNOSIS — J4520 Mild intermittent asthma, uncomplicated: Secondary | ICD-10-CM

## 2018-08-10 DIAGNOSIS — M25561 Pain in right knee: Secondary | ICD-10-CM | POA: Diagnosis not present

## 2018-08-10 DIAGNOSIS — I10 Essential (primary) hypertension: Secondary | ICD-10-CM | POA: Diagnosis not present

## 2018-08-10 DIAGNOSIS — R002 Palpitations: Secondary | ICD-10-CM

## 2018-08-10 DIAGNOSIS — Z79899 Other long term (current) drug therapy: Secondary | ICD-10-CM | POA: Diagnosis not present

## 2018-08-10 DIAGNOSIS — M7989 Other specified soft tissue disorders: Secondary | ICD-10-CM | POA: Diagnosis not present

## 2018-08-10 DIAGNOSIS — I8391 Asymptomatic varicose veins of right lower extremity: Secondary | ICD-10-CM | POA: Diagnosis not present

## 2018-08-10 DIAGNOSIS — Z791 Long term (current) use of non-steroidal anti-inflammatories (NSAID): Secondary | ICD-10-CM | POA: Diagnosis not present

## 2018-08-10 DIAGNOSIS — G8929 Other chronic pain: Secondary | ICD-10-CM | POA: Diagnosis not present

## 2018-08-10 MED ORDER — ALBUTEROL SULFATE HFA 108 (90 BASE) MCG/ACT IN AERS: 2.0000 | INHALATION_SPRAY | Freq: Four times a day (QID) | RESPIRATORY_TRACT | 3 refills | Status: DC | PRN

## 2018-08-10 MED ORDER — AMLODIPINE BESYLATE 10 MG PO TABS: 10.0000 mg | ORAL_TABLET | Freq: Every day | ORAL | 2 refills | Status: DC

## 2018-08-10 MED ORDER — MELOXICAM 7.5 MG PO TABS: 7.5000 mg | ORAL_TABLET | Freq: Every day | ORAL | 5 refills | Status: DC | PRN

## 2018-08-10 MED ORDER — DICLOFENAC SODIUM 1 % TD GEL: 2.0000 g | Freq: Four times a day (QID) | TRANSDERMAL | 2 refills | Status: DC

## 2018-08-10 MED ORDER — LISINOPRIL-HYDROCHLOROTHIAZIDE 20-25 MG PO TABS: 1.0000 | ORAL_TABLET | Freq: Every day | ORAL | 2 refills | Status: DC

## 2018-08-10 NOTE — Progress Notes (Signed)
   CC: Medication refill, leg pain and swelling  HPI:  [name] is a 62 y.o. with PMHx listed as below, came in for chronic right painful knee and leg swelling and medications refill. She did not take her medicines for more than a month due to financial situation.    Past Medical History:  Diagnosis Date  . Arthritis   . Asthma   . CVA (cerebral vascular accident) (HCC) 2008  . Hypertension   . Irregular heart beat 2015   Review of Systems:  Review of Systems  Constitutional: Negative for chills, fever and malaise/fatigue.  Respiratory: Positive for shortness of breath. Negative for cough.   Cardiovascular: Positive for palpitations and leg swelling. Negative for chest pain.  Gastrointestinal: Negative for abdominal pain, constipation, diarrhea, heartburn, nausea and vomiting.  Genitourinary: Negative for dysuria, frequency, hematuria and urgency.  Skin: Negative for rash.  Neurological: Negative for dizziness.  Psychiatric/Behavioral: The patient does not have insomnia.     Physical Exam:  Vitals:   08/10/18 0912  BP: (!) 180/118  Pulse: 84  Temp: 97.7 F (36.5 C)  TempSrc: Oral  SpO2: 98%  Weight: 152 lb 1.6 oz (69 kg)   Physical Exam  Constitutional: She is oriented to person, place, and time. She appears well-developed and well-nourished. No distress.  HENT:  Head: Normocephalic and atraumatic.  Cardiovascular: Normal rate, regular rhythm, normal heart sounds and intact distal pulses. Exam reveals no gallop and no friction rub.  No murmur heard. Pulmonary/Chest: Effort normal and breath sounds normal. No respiratory distress. She has no wheezes. She has no rales.  Abdominal: Soft. Bowel sounds are normal. She exhibits no distension. There is no tenderness.  Musculoskeletal: She exhibits edema and tenderness.  Neurological: She is alert and oriented to person, place, and time. No sensory deficit. She exhibits normal muscle tone.  Skin: Skin is warm and  dry. No rash noted. No pallor.  Psychiatric: She has a normal mood and affect.    Assessment & Plan:  [name] is a 62 y.o. with PMHx listed as above, came in for painful leg swelling and medications refill. She did not take her medicines for more than a month due to financial situation.   1- Right leg pain and Right knee pain:  She complains of chronic pain (for about a year) in her right leg, . It is a sharp cramp pain on the proximal posterior part of her leg, mostly happens after long standing. Also reports some leg and knee swelling and painful right knee when walking. On exam she has mild non pitting edema and mild tenderness on posterior part of her leg, some varicosis veins are visible.Has crepitation on knee exam. Drawer test are negative.   Meloxicame once a day helps sometimes.  -Ct Meloxicam  -Apply Voltaren gel 3 times daily as needed  -Encouraged to wrap the leg to avoid progress of varicosis vein and improving the pain -BMP for next visit  2-HTN: Has been out of medications for more than a month. BP:180/118, Asymptomatic.   -Refilled Amlodipine, Lisinopril-HCTZ today   -Come back to clinic in a month Patient seen with Dr. Heide Spark

## 2018-08-10 NOTE — Patient Instructions (Addendum)
It was our pleasure taking care of you in our clinic today. You were seen for medication refill and your knee pain. Please take your medication as before and apply Voltaren gel twice a day as needed on your knee. We also do some blood test today and will contact you about the result. Please follow up with your PCP and let us know if you have any questions or concern.  Thanks, Dr. Teola Bradley

## 2018-08-10 NOTE — Assessment & Plan Note (Signed)
Complains of Knee pain, has mild swelling and crepitation on exam -Ct Meloxicam  -Apply Voltaren gel 3 times daily as needed

## 2018-08-10 NOTE — Assessment & Plan Note (Signed)
  Hypertensive today. BP:180/118. Asymptomatic. Has been out of medications for more than a month.  -Refilled Antihypertensive medications today and recommended to take her meds ASAB and regularly

## 2018-08-11 ENCOUNTER — Other Ambulatory Visit: Payer: Self-pay | Admitting: *Deleted

## 2018-08-11 ENCOUNTER — Other Ambulatory Visit: Payer: Self-pay | Admitting: Internal Medicine

## 2018-08-11 DIAGNOSIS — J4520 Mild intermittent asthma, uncomplicated: Secondary | ICD-10-CM

## 2018-08-11 DIAGNOSIS — I10 Essential (primary) hypertension: Secondary | ICD-10-CM

## 2018-08-11 LAB — BMP8+ANION GAP
Anion Gap: 11 mmol/L (ref 10.0–18.0)
BUN / CREAT RATIO: 19 (ref 12–28)
BUN: 14 mg/dL (ref 8–27)
CO2: 26 mmol/L (ref 20–29)
CREATININE: 0.73 mg/dL (ref 0.57–1.00)
Calcium: 8.8 mg/dL (ref 8.7–10.3)
Chloride: 108 mmol/L — ABNORMAL HIGH (ref 96–106)
GFR calc non Af Amer: 87 mL/min/{1.73_m2} (ref >59)
GFR, EST AFRICAN AMERICAN: 101 mL/min/{1.73_m2} (ref >59)
GLUCOSE: 86 mg/dL (ref 65–99)
Potassium: 4 mmol/L (ref 3.5–5.2)
Sodium: 145 mmol/L — ABNORMAL HIGH (ref 134–144)

## 2018-08-11 MED ORDER — LISINOPRIL-HYDROCHLOROTHIAZIDE 20-25 MG PO TABS: 1.0000 | ORAL_TABLET | Freq: Every day | ORAL | 2 refills | Status: DC

## 2018-08-11 MED ORDER — MELOXICAM 7.5 MG PO TABS: 7.5000 mg | ORAL_TABLET | Freq: Every day | ORAL | 5 refills | Status: DC | PRN

## 2018-08-11 MED ORDER — ALBUTEROL SULFATE HFA 108 (90 BASE) MCG/ACT IN AERS: 2.0000 | INHALATION_SPRAY | Freq: Four times a day (QID) | RESPIRATORY_TRACT | 3 refills | Status: DC | PRN

## 2018-08-11 MED ORDER — AMLODIPINE BESYLATE 10 MG PO TABS: 10.0000 mg | ORAL_TABLET | Freq: Every day | ORAL | 2 refills | Status: DC

## 2018-08-11 MED ORDER — DICLOFENAC SODIUM 1 % TD GEL: 2.0000 g | Freq: Four times a day (QID) | TRANSDERMAL | 2 refills | Status: DC

## 2018-08-11 NOTE — Telephone Encounter (Signed)
Needs refills albuterol (PROVENTIL HFA;VENTOLIN HFA) 108 (90 Base) MCG/ACT inhaler,   amLODipine (NORVASC) 10 MG tablet, diclofenac sodium (VOLTAREN) 1 % GEL  lisinopril-hydrochlorothiazide (PRINZIDE,ZESTORETIC) 20-25 MG tablet meloxicam (MOBIC) 7.5 MG tablet; [facility]         Pt contact# [phone]

## 2018-08-11 NOTE — Telephone Encounter (Signed)
Call from [facility] - pt's insurance does not recognize prescriber ( Dr Maryla MorrowMasoudi) ; I will ask  Attending to re-send. Thanks

## 2018-08-11 NOTE — Telephone Encounter (Signed)
All [facility] today by Attending. Kinnie FeilL. Ducatte, RN, BSN

## 2018-08-12 ENCOUNTER — Telehealth: Payer: Self-pay | Admitting: Internal Medicine

## 2018-08-12 NOTE — Telephone Encounter (Signed)
Called the patient to ask how she is doing and recommend stopping Meloxicam while using Voltaren. Voice message announcement did not sound to belong to [name] so I will contact again to personally talk to her.   Stephanie Snyder

## 2018-08-12 NOTE — Telephone Encounter (Signed)
-----   Message from Earl LagosNischal Narendra, MD sent at 08/12/2018  8:17 AM EDT ----- Regarding: plan Hello Elham, I would not advise her to continue an NSAID plus the voltaren gel. I would advise her to try the Voltaren gel instead of the NSAID.  Also you do not need to do 2 plans. Under Assessment and Plan just say see problem based charting for A and P and put the plan under the problems like we discussed. Otherwise you are doubling your work.   Thanks, Dr. Heide SparkNarendra   ----- Message ----- From: Chevis PrettyMasoudi, Saraphina Lauderbaugh, MD Sent: 08/10/2018   5:54 PM EDT To: Earl LagosNischal Narendra, MD

## 2018-08-12 NOTE — Progress Notes (Signed)
Internal Medicine Clinic Attending  I saw and evaluated the patient.  I personally confirmed the key portions of the history and exam documented by Dr. Masoudi and I reviewed pertinent patient test results.  The assessment, diagnosis, and plan were formulated together and I agree with the documentation in the resident's note. 

## 2018-08-16 ENCOUNTER — Telehealth: Payer: Self-pay | Admitting: *Deleted

## 2018-08-16 NOTE — Telephone Encounter (Signed)
[facility] [place] for Diclofenac Gel 1%.  Approved 11/28/2017 thru 11/29/2018.  Call to [facility] to notify them of the approval.  Angelina OkGladys Herbin, RN 07/16/2018 3:21 PM.

## 2018-09-08 ENCOUNTER — Telehealth: Payer: Self-pay | Admitting: Internal Medicine

## 2018-10-19 ENCOUNTER — Ambulatory Visit: Payer: Medicare Other

## 2018-10-26 ENCOUNTER — Encounter: Payer: Self-pay | Admitting: Internal Medicine

## 2018-10-26 ENCOUNTER — Ambulatory Visit: Payer: Medicare Other

## 2018-11-21 ENCOUNTER — Other Ambulatory Visit: Payer: Self-pay

## 2018-11-21 ENCOUNTER — Encounter (HOSPITAL_COMMUNITY): Payer: Self-pay | Admitting: *Deleted

## 2018-11-21 ENCOUNTER — Emergency Department (HOSPITAL_COMMUNITY): Payer: Medicare Other

## 2018-11-21 ENCOUNTER — Inpatient Hospital Stay (HOSPITAL_COMMUNITY)
Admission: EM | Admit: 2018-11-21 | Discharge: 2018-12-08 | DRG: 064 | Disposition: A | Discharge status: Home Health | Payer: Medicare Other | Attending: Internal Medicine | Admitting: Internal Medicine

## 2018-11-21 DIAGNOSIS — E785 Hyperlipidemia, unspecified: Secondary | ICD-10-CM | POA: Diagnosis present

## 2018-11-21 DIAGNOSIS — I2699 Other pulmonary embolism without acute cor pulmonale: Secondary | ICD-10-CM | POA: Diagnosis present

## 2018-11-21 DIAGNOSIS — F29 Unspecified psychosis not due to a substance or known physiological condition: Secondary | ICD-10-CM | POA: Diagnosis not present

## 2018-11-21 DIAGNOSIS — I824Z3 Acute embolism and thrombosis of unspecified deep veins of distal lower extremity, bilateral: Secondary | ICD-10-CM

## 2018-11-21 DIAGNOSIS — X58XXXA Exposure to other specified factors, initial encounter: Secondary | ICD-10-CM | POA: Diagnosis present

## 2018-11-21 DIAGNOSIS — J9600 Acute respiratory failure, unspecified whether with hypoxia or hypercapnia: Secondary | ICD-10-CM

## 2018-11-21 DIAGNOSIS — E876 Hypokalemia: Secondary | ICD-10-CM | POA: Diagnosis not present

## 2018-11-21 DIAGNOSIS — S199XXA Unspecified injury of neck, initial encounter: Secondary | ICD-10-CM | POA: Diagnosis not present

## 2018-11-21 DIAGNOSIS — Z23 Encounter for immunization: Secondary | ICD-10-CM | POA: Diagnosis not present

## 2018-11-21 DIAGNOSIS — I63311 Cerebral infarction due to thrombosis of right middle cerebral artery: Principal | ICD-10-CM | POA: Diagnosis present

## 2018-11-21 DIAGNOSIS — J45909 Unspecified asthma, uncomplicated: Secondary | ICD-10-CM | POA: Diagnosis present

## 2018-11-21 DIAGNOSIS — R571 Hypovolemic shock: Secondary | ICD-10-CM | POA: Diagnosis not present

## 2018-11-21 DIAGNOSIS — R55 Syncope and collapse: Secondary | ICD-10-CM | POA: Diagnosis not present

## 2018-11-21 DIAGNOSIS — R4182 Altered mental status, unspecified: Secondary | ICD-10-CM | POA: Diagnosis not present

## 2018-11-21 DIAGNOSIS — F0390 Unspecified dementia without behavioral disturbance: Secondary | ICD-10-CM | POA: Diagnosis present

## 2018-11-21 DIAGNOSIS — R451 Restlessness and agitation: Secondary | ICD-10-CM

## 2018-11-21 DIAGNOSIS — I10 Essential (primary) hypertension: Secondary | ICD-10-CM | POA: Diagnosis not present

## 2018-11-21 DIAGNOSIS — I119 Hypertensive heart disease without heart failure: Secondary | ICD-10-CM | POA: Diagnosis present

## 2018-11-21 DIAGNOSIS — N179 Acute kidney failure, unspecified: Secondary | ICD-10-CM | POA: Diagnosis present

## 2018-11-21 DIAGNOSIS — N390 Urinary tract infection, site not specified: Secondary | ICD-10-CM | POA: Diagnosis present

## 2018-11-21 DIAGNOSIS — I959 Hypotension, unspecified: Secondary | ICD-10-CM

## 2018-11-21 DIAGNOSIS — S0990XA Unspecified injury of head, initial encounter: Secondary | ICD-10-CM | POA: Diagnosis not present

## 2018-11-21 DIAGNOSIS — T50915A Adverse effect of multiple unspecified drugs, medicaments and biological substances, initial encounter: Secondary | ICD-10-CM | POA: Diagnosis present

## 2018-11-21 DIAGNOSIS — M1990 Unspecified osteoarthritis, unspecified site: Secondary | ICD-10-CM | POA: Diagnosis present

## 2018-11-21 DIAGNOSIS — G929 Unspecified toxic encephalopathy: Secondary | ICD-10-CM

## 2018-11-21 DIAGNOSIS — Z599 Problem related to housing and economic circumstances, unspecified: Secondary | ICD-10-CM

## 2018-11-21 DIAGNOSIS — Z79899 Other long term (current) drug therapy: Secondary | ICD-10-CM

## 2018-11-21 DIAGNOSIS — G92 Toxic encephalopathy: Secondary | ICD-10-CM | POA: Diagnosis not present

## 2018-11-21 DIAGNOSIS — J9601 Acute respiratory failure with hypoxia: Secondary | ICD-10-CM | POA: Diagnosis not present

## 2018-11-21 DIAGNOSIS — E872 Acidosis: Secondary | ICD-10-CM | POA: Diagnosis present

## 2018-11-21 DIAGNOSIS — R000 Tachycardia, unspecified: Secondary | ICD-10-CM | POA: Diagnosis not present

## 2018-11-21 DIAGNOSIS — I82443 Acute embolism and thrombosis of tibial vein, bilateral: Secondary | ICD-10-CM | POA: Diagnosis present

## 2018-11-21 DIAGNOSIS — Z8673 Personal history of transient ischemic attack (TIA), and cerebral infarction without residual deficits: Secondary | ICD-10-CM

## 2018-11-21 DIAGNOSIS — Z791 Long term (current) use of non-steroidal anti-inflammatories (NSAID): Secondary | ICD-10-CM

## 2018-11-21 DIAGNOSIS — I6521 Occlusion and stenosis of right carotid artery: Secondary | ICD-10-CM | POA: Diagnosis present

## 2018-11-21 DIAGNOSIS — E878 Other disorders of electrolyte and fluid balance, not elsewhere classified: Secondary | ICD-10-CM | POA: Diagnosis present

## 2018-11-21 DIAGNOSIS — M5030 Other cervical disc degeneration, unspecified cervical region: Secondary | ICD-10-CM | POA: Diagnosis present

## 2018-11-21 DIAGNOSIS — F05 Delirium due to known physiological condition: Secondary | ICD-10-CM | POA: Diagnosis present

## 2018-11-21 DIAGNOSIS — Z781 Physical restraint status: Secondary | ICD-10-CM

## 2018-11-21 DIAGNOSIS — G9341 Metabolic encephalopathy: Secondary | ICD-10-CM

## 2018-11-21 DIAGNOSIS — F23 Brief psychotic disorder: Secondary | ICD-10-CM | POA: Diagnosis not present

## 2018-11-21 DIAGNOSIS — R45851 Suicidal ideations: Secondary | ICD-10-CM | POA: Diagnosis present

## 2018-11-21 DIAGNOSIS — I2609 Other pulmonary embolism with acute cor pulmonale: Secondary | ICD-10-CM

## 2018-11-21 DIAGNOSIS — R110 Nausea: Secondary | ICD-10-CM | POA: Diagnosis not present

## 2018-11-21 DIAGNOSIS — Z22322 Carrier or suspected carrier of Methicillin resistant Staphylococcus aureus: Secondary | ICD-10-CM

## 2018-11-21 DIAGNOSIS — R69 Illness, unspecified: Secondary | ICD-10-CM

## 2018-11-21 DIAGNOSIS — R4020 Unspecified coma: Secondary | ICD-10-CM | POA: Diagnosis not present

## 2018-11-21 DIAGNOSIS — I82453 Acute embolism and thrombosis of peroneal vein, bilateral: Secondary | ICD-10-CM | POA: Diagnosis present

## 2018-11-21 DIAGNOSIS — W19XXXA Unspecified fall, initial encounter: Secondary | ICD-10-CM | POA: Diagnosis not present

## 2018-11-21 DIAGNOSIS — M47812 Spondylosis without myelopathy or radiculopathy, cervical region: Secondary | ICD-10-CM | POA: Diagnosis present

## 2018-11-21 LAB — CBC WITH DIFFERENTIAL/PLATELET
Abs Immature Granulocytes: 0.02 10*3/uL (ref 0.00–0.07)
Basophils Absolute: 0.1 10*3/uL (ref 0.0–0.1)
Basophils Relative: 1 %
Eosinophils Absolute: 0.1 10*3/uL (ref 0.0–0.5)
Eosinophils Relative: 1 %
HCT: 44.2 % (ref 36.0–46.0)
HEMOGLOBIN: 14 g/dL (ref 12.0–15.0)
Immature Granulocytes: 0 %
Lymphocytes Relative: 28 %
Lymphs Abs: 2 10*3/uL (ref 0.7–4.0)
MCH: 27 pg (ref 26.0–34.0)
MCHC: 31.7 g/dL (ref 30.0–36.0)
MCV: 85.3 fL (ref 80.0–100.0)
Monocytes Absolute: 0.6 10*3/uL (ref 0.1–1.0)
Monocytes Relative: 8 %
Neutro Abs: 4.4 10*3/uL (ref 1.7–7.7)
Neutrophils Relative %: 62 %
Platelets: 269 10*3/uL (ref 150–400)
RBC: 5.18 MIL/uL — AB (ref 3.87–5.11)
RDW: 12.8 % (ref 11.5–15.5)
WBC: 7.2 10*3/uL (ref 4.0–10.5)
nRBC: 0 % (ref 0.0–0.2)

## 2018-11-21 LAB — BASIC METABOLIC PANEL
Anion gap: 14 (ref 5–15)
BUN: 22 mg/dL (ref 8–23)
CO2: 22 mmol/L (ref 22–32)
Calcium: 8.9 mg/dL (ref 8.9–10.3)
Chloride: 103 mmol/L (ref 98–111)
Creatinine, Ser: 1.02 mg/dL — ABNORMAL HIGH (ref 0.44–1.00)
GFR calc Af Amer: >60 mL/min (ref >60)
GFR calc non Af Amer: 58 mL/min — ABNORMAL LOW (ref >60)
Glucose, Bld: 131 mg/dL — ABNORMAL HIGH (ref 70–99)
Potassium: 3.3 mmol/L — ABNORMAL LOW (ref 3.5–5.1)
SODIUM: 139 mmol/L (ref 135–145)

## 2018-11-21 LAB — ETHANOL: Alcohol, Ethyl (B): <10 mg/dL (ref <10)

## 2018-11-21 LAB — RAPID URINE DRUG SCREEN, HOSP PERFORMED
Amphetamines: NOT DETECTED
Barbiturates: NOT DETECTED
Benzodiazepines: NOT DETECTED
Cocaine: NOT DETECTED
Opiates: NOT DETECTED
Tetrahydrocannabinol: NOT DETECTED

## 2018-11-21 LAB — ACETAMINOPHEN LEVEL

## 2018-11-21 LAB — URINALYSIS, ROUTINE W REFLEX MICROSCOPIC
Bilirubin Urine: NEGATIVE
Glucose, UA: NEGATIVE mg/dL
Hgb urine dipstick: NEGATIVE
Ketones, ur: NEGATIVE mg/dL
Nitrite: NEGATIVE
Protein, ur: NEGATIVE mg/dL
Specific Gravity, Urine: 1.019 (ref 1.005–1.030)
pH: 5 (ref 5.0–8.0)

## 2018-11-21 LAB — SALICYLATE LEVEL: Salicylate Lvl: <7 mg/dL (ref 2.8–30.0)

## 2018-11-21 LAB — RAPID HIV SCREEN (HIV 1/2 AB+AG)
HIV 1/2 Antibodies: NONREACTIVE
HIV-1 P24 Antigen - HIV24: NONREACTIVE

## 2018-11-21 MED ORDER — ASENAPINE MALEATE 5 MG SL SUBL
5.0000 mg | SUBLINGUAL_TABLET | Freq: Once | SUBLINGUAL | Status: DC
  Filled 2018-11-21 (×2): qty 1

## 2018-11-21 MED ORDER — ZIPRASIDONE MESYLATE 20 MG IM SOLR
20.0000 mg | Freq: Once | INTRAMUSCULAR | Status: AC
  Administered 2018-11-21: 20 mg via INTRAMUSCULAR
  Filled 2018-11-21: qty 20

## 2018-11-21 MED ORDER — STERILE WATER FOR INJECTION IJ SOLN
INTRAMUSCULAR | Status: AC
  Administered 2018-11-21: 10 mL
  Filled 2018-11-21: qty 10

## 2018-11-21 MED ORDER — CEFTRIAXONE SODIUM 1 G IJ SOLR
1.0000 g | Freq: Once | INTRAMUSCULAR | Status: AC
  Administered 2018-11-21: 1 g via INTRAMUSCULAR
  Filled 2018-11-21: qty 10

## 2018-11-21 MED ORDER — ONDANSETRON HCL 4 MG/2ML IJ SOLN
4.0000 mg | Freq: Once | INTRAMUSCULAR | Status: AC
  Administered 2018-11-21: 4 mg via INTRAVENOUS
  Filled 2018-11-21: qty 2

## 2018-11-21 MED ORDER — STERILE WATER FOR INJECTION IJ SOLN
INTRAMUSCULAR | Status: AC
  Administered 2018-11-21: 1.2 mL
  Filled 2018-11-21: qty 10

## 2018-11-21 MED ORDER — ZIPRASIDONE MESYLATE 20 MG IM SOLR
20.0000 mg | Freq: Once | INTRAMUSCULAR | Status: AC
  Administered 2018-11-21: 20 mg via INTRAMUSCULAR

## 2018-11-21 MED ORDER — POTASSIUM CHLORIDE CRYS ER 20 MEQ PO TBCR
40.0000 meq | EXTENDED_RELEASE_TABLET | Freq: Once | ORAL | Status: DC
  Filled 2018-11-21: qty 2

## 2018-11-21 MED ORDER — SODIUM CHLORIDE 0.9 % IV BOLUS
1000.0000 mL | Freq: Once | INTRAVENOUS | Status: AC
  Administered 2018-11-21: 1000 mL via INTRAVENOUS

## 2018-11-21 NOTE — ED Notes (Signed)
Merle, RN was notified of BP.

## 2018-11-21 NOTE — ED Notes (Signed)
Pt states, "fuck you" to this RN.

## 2018-11-21 NOTE — ED Notes (Signed)
Pt's husband, Sunday ShamsJason Goldsberry (Cell) [phone]
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
## 2018-11-21 NOTE — ED Notes (Signed)
Patient confused, disoriented, weakness. Patient almost fell a couple of times, had to hold on to tech. Patient is not speaking. Appears afraid.
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
## 2018-11-21 NOTE — ED Notes (Signed)
Pt calm, cooperative, speaking in complete sentences.

## 2018-11-21 NOTE — ED Notes (Signed)
Bed: RESA Expected date:  Expected time:  Means of arrival:  Comments: Combative with [employer] 
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
## 2018-11-21 NOTE — ED Notes (Signed)
Pt's BP dropped and an IV was started and verbal order was given by Dr. Read DriversMolpus to start a NaCl bolus.  Pt also had an episode of emesis.

## 2018-11-21 NOTE — ED Notes (Signed)
Bed: WA16 Expected date:  Expected time:  Means of arrival:  Comments: Res A 

## 2018-11-21 NOTE — ED Notes (Addendum)
This nurse asusming care of pt at this time. Pt pleasant on approach, voicing no complaints at this time. Safety sitter present. Will continue to monitor.

## 2018-11-21 NOTE — ED Notes (Signed)
Went to give patient potassium and sapphris and patient ran out of room. She was stopped by [employer] and then ran into room 30. We were unable to get her redirected to room. She tried to lock the bathroom door in room 30.  Patient started yelling and became combative. Notified Dr. Jeraldine LootsLockwood. New order for 20mg  IM [place]. Security assisted getting patient back to room and medication given. Will continue to monitor.

## 2018-11-22 ENCOUNTER — Emergency Department (HOSPITAL_COMMUNITY): Payer: Medicare Other

## 2018-11-22 DIAGNOSIS — R4182 Altered mental status, unspecified: Secondary | ICD-10-CM | POA: Diagnosis not present

## 2018-11-22 DIAGNOSIS — F23 Brief psychotic disorder: Secondary | ICD-10-CM | POA: Diagnosis present

## 2018-11-22 LAB — HEPATITIS PANEL, ACUTE
HCV Ab: <0.1 s/co ratio (ref 0.0–0.9)
Hep A IgM: NEGATIVE
Hep B C IgM: NEGATIVE
Hepatitis B Surface Ag: NEGATIVE

## 2018-11-22 MED ORDER — OLANZAPINE 10 MG IM SOLR
5.0000 mg | Freq: Two times a day (BID) | INTRAMUSCULAR | Status: DC | PRN
  Administered 2018-11-22 – 2018-11-25 (×4): 5 mg via INTRAMUSCULAR
  Filled 2018-11-22 (×5): qty 10

## 2018-11-22 MED ORDER — LISINOPRIL-HYDROCHLOROTHIAZIDE 20-25 MG PO TABS: 1.0000 | ORAL_TABLET | Freq: Every day | ORAL | Status: DC

## 2018-11-22 MED ORDER — HYDROCHLOROTHIAZIDE 25 MG PO TABS
25.0000 mg | ORAL_TABLET | Freq: Every day | ORAL | Status: DC
  Administered 2018-11-23 – 2018-11-29 (×6): 25 mg via ORAL
  Filled 2018-11-22 (×8): qty 1

## 2018-11-22 MED ORDER — AMLODIPINE BESYLATE 5 MG PO TABS
10.0000 mg | ORAL_TABLET | Freq: Every day | ORAL | Status: DC
  Administered 2018-11-23 – 2018-11-29 (×6): 10 mg via ORAL
  Filled 2018-11-22 (×7): qty 2

## 2018-11-22 MED ORDER — OLANZAPINE 10 MG IM SOLR
5.0000 mg | Freq: Once | INTRAMUSCULAR | Status: AC
  Administered 2018-11-22: 5 mg via INTRAMUSCULAR
  Filled 2018-11-22: qty 10

## 2018-11-22 MED ORDER — STERILE WATER FOR INJECTION IJ SOLN
INTRAMUSCULAR | Status: AC
  Filled 2018-11-22: qty 10

## 2018-11-22 MED ORDER — OLANZAPINE 5 MG PO TBDP
5.0000 mg | ORAL_TABLET | Freq: Every day | ORAL | Status: DC
  Administered 2018-11-23 – 2018-11-24 (×2): 5 mg via ORAL
  Filled 2018-11-22 (×3): qty 1

## 2018-11-22 MED ORDER — DIPHENHYDRAMINE HCL 50 MG/ML IJ SOLN
25.0000 mg | Freq: Once | INTRAMUSCULAR | Status: AC
  Administered 2018-11-22: 25 mg via INTRAMUSCULAR
  Filled 2018-11-22: qty 1

## 2018-11-22 MED ORDER — LISINOPRIL 20 MG PO TABS
20.0000 mg | ORAL_TABLET | Freq: Every day | ORAL | Status: DC
  Administered 2018-11-23 – 2018-11-29 (×6): 20 mg via ORAL
  Filled 2018-11-22 (×7): qty 1

## 2018-11-22 MED ORDER — ALBUTEROL SULFATE HFA 108 (90 BASE) MCG/ACT IN AERS: 2.0000 | INHALATION_SPRAY | Freq: Four times a day (QID) | RESPIRATORY_TRACT | Status: DC | PRN
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
## 2018-11-22 NOTE — ED Notes (Signed)
Patient in bathroom refusing to come out. Yelling "yall are going to kill me", Patient informed that staff is here to help and will not be hurt. Security notified. MD notified.

## 2018-11-22 NOTE — BH Assessment (Signed)
[facility]  Per Juanetta BeetsJacqueline Norman, DO, this pt requires psychiatric hospitalization at a facility providing specialty care for geriatric patients at this time.  Pt presents under IVC initiated by EDP J. Brock BadLane Molpus, MD.  The following facilities have been contacted to seek placement for this pt, with results as noted:  Beds available, information sent, decision pending:  [facility]  [facility] also [facility], but they later determined that they would not have any geriatric beds available today.   [facility]  [facility]   Doylene Canninghomas Avrian Delfavero, [place] [id number] Health Coordinator [phone]
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
## 2018-11-22 NOTE — ED Notes (Signed)
With attempts to take vitals, patient refused to let anyone take vitals and demanded a manual cuff to take the vitals her self. Will allow the patient to calm down and take vitals.
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
## 2018-11-23 DIAGNOSIS — F23 Brief psychotic disorder: Secondary | ICD-10-CM | POA: Diagnosis not present

## 2018-11-23 LAB — TSH: TSH: 0.611 u[IU]/mL (ref 0.350–4.500)

## 2018-11-23 LAB — VITAMIN B12: Vitamin B-12: 228 pg/mL (ref 180–914)

## 2018-11-23 MED ORDER — ACETAMINOPHEN 325 MG PO TABS
650.0000 mg | ORAL_TABLET | ORAL | Status: DC | PRN
  Administered 2018-11-23 – 2018-11-29 (×2): 650 mg via ORAL
  Filled 2018-11-23 (×2): qty 2

## 2018-11-23 MED ORDER — ZIPRASIDONE MESYLATE 20 MG IM SOLR
20.0000 mg | Freq: Once | INTRAMUSCULAR | Status: AC
  Administered 2018-11-23: 20 mg via INTRAMUSCULAR
  Filled 2018-11-23: qty 20

## 2018-11-23 MED ORDER — STERILE WATER FOR INJECTION IJ SOLN
INTRAMUSCULAR | Status: AC
  Administered 2018-11-23: 10 mL
  Filled 2018-11-23: qty 10

## 2018-11-23 MED ORDER — STERILE WATER FOR INJECTION IJ SOLN
INTRAMUSCULAR | Status: AC
  Administered 2018-11-23: 1.2 mL
  Filled 2018-11-23: qty 10

## 2018-11-23 NOTE — ED Notes (Signed)
Pt reluctant to take tylenol stating "that doesn't look like the tylenol I take at home."  Assured pt, the scanner will not allow us to give drugs that are not ordered, thereby preventing errors.  Pt took meds offered.  Pt now requesting to use the phone.  Informed phone privileges are from 1000 to 2100.

## 2018-11-23 NOTE — ED Notes (Signed)
Pt c/o lower back pain.  Melvenia BeamShari, PA-C informed.  Verbal order received.  See orders.

## 2018-11-23 NOTE — ED Notes (Signed)
Pt quickly jumped out of bed from sleep. Pt slammed room door closed. Was able to push door open enough to place foot in door. Pt stepped back and let me open it. Patient sat down on bed, but continued to yell for us to give her her meds and get out.
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
## 2018-11-23 NOTE — ED Notes (Signed)
Patient woke up and immediately shut her door. Tried to redirect patient and give oral zyprexa. Called security for standby. While trying to give oral zyprexa she became extremely agitated and starting yelling the NT and I to all "get away from her". Notified MD. New order for Geodon 20mg  IM ordered and given while Security stood by. Will continue to monitor.

## 2018-11-23 NOTE — Consult Note (Signed)
[facility]  11/23/2018 10:29 AM Stephanie Snyder  MRN:  [id number] Subjective:   Stephanie Snyder was lying in the floor by her bed. She would not participate in interview. She received behavioral medications for agitation this morning. She continues to exhibit paranoia.   Principal Problem: Acute psychosis (HCC) Diagnosis:  Principal Problem:   Acute psychosis (HCC)  Total Time spent with patient: 15 minutes  Past Psychiatric History: Denies   Past Medical History:  Past Medical History:  Diagnosis Date  . Arthritis   . Asthma   . CVA (cerebral vascular accident) (HCC) 2008  . Hypertension   . Irregular heart beat 2015    Past Surgical History:  Procedure Laterality Date  . TUBAL LIGATION  1984   Family History:  Family History  Problem Relation Age of Onset  . Cancer Father    Family Psychiatric  History: None per chart review.  Social History:  Social History   Substance and Sexual Activity  Alcohol Use No     Social History   Substance and Sexual Activity  Drug Use No    Social History   Socioeconomic History  . Marital status: Married    Spouse name: Not on file  . Number of children: Not on file  . Years of education: Not on file  . Highest education level: Not on file  Occupational History  . Not on file  Social Needs  . Financial resource strain: Not on file  . Food insecurity:    Worry: Not on file    Inability: Not on file  . Transportation needs:    Medical: Not on file    Non-medical: Not on file  Tobacco Use  . Smoking status: Never Smoker  . Smokeless tobacco: Never Used  Substance and Sexual Activity  . Alcohol use: No  . Drug use: No  . Sexual activity: Not on file  Lifestyle  . Physical activity:    Days per week: Not on file    Minutes per session: Not on file  . Stress: Not on file  Relationships  . Social connections:    Talks on phone: Not on file    Gets together: Not on file    Attends religious service:  Not on file    Active member of club or organization: Not on file    Attends meetings of clubs or organizations: Not on file    Relationship status: Not on file  Other Topics Concern  . Not on file  Social History Narrative  . Not on file    Sleep: Fair  Appetite:  Fair  Current Medications: Current Facility-Administered Medications  Medication Dose Route Frequency Provider Last Rate Last Dose  . albuterol (PROVENTIL HFA;VENTOLIN HFA) 108 (90 Base) MCG/ACT inhaler 2 puff  2 puff Inhalation Q6H PRN Curatolo, Adam, DO      . amLODipine (NORVASC) tablet 10 mg  10 mg Oral Daily Curatolo, Adam, DO   10 mg at 11/23/18 1004  . lisinopril (PRINIVIL,ZESTRIL) tablet 20 mg  20 mg Oral Daily Curatolo, Adam, DO   20 mg at 11/23/18 1004   And  . hydrochlorothiazide (HYDRODIURIL) tablet 25 mg  25 mg Oral Daily Curatolo, Adam, DO   25 mg at 11/23/18 1004  . OLANZapine (ZYPREXA) injection 5 mg  5 mg Intramuscular BID PRN Cherly BeachNorman, Jacqueline J, DO   5 mg at 11/23/18 1004  . OLANZapine zydis (ZYPREXA) disintegrating tablet 5 mg  5 mg Oral QHS Sharma CovertNorman,  Rhea BleacherJacqueline J, DO      . potassium chloride SA (K-DUR,KLOR-CON) CR tablet 40 mEq  40 mEq Oral Once Paula LibraMolpus, John, MD   Stopped at 11/21/18 [phone]   Current Outpatient Medications  Medication Sig Dispense Refill  . albuterol (PROVENTIL HFA;VENTOLIN HFA) 108 (90 Base) MCG/ACT inhaler Inhale 2 puffs into the lungs every 6 (six) hours as needed for wheezing or shortness of breath. 18 g 3  . amLODipine (NORVASC) 10 MG tablet Take 1 tablet (10 mg total) by mouth daily. 90 tablet 2  . diclofenac sodium (VOLTAREN) 1 % GEL Apply 2 g topically 4 (four) times daily. As needed 1 Tube 2  . lisinopril-hydrochlorothiazide (PRINZIDE,ZESTORETIC) 20-25 MG tablet Take 1 tablet by mouth daily. 90 tablet 2  . meloxicam (MOBIC) 7.5 MG tablet Take 1 tablet (7.5 mg total) by mouth daily as needed for pain. 30 tablet 5  . cetirizine (ZYRTEC) 5 MG tablet Take 1 tablet (5 mg total) by  mouth daily. (Patient not taking: Reported on 11/21/2018) 30 tablet 3    Lab Results: No results found for this or any previous visit (from the past 48 hour(s)).  Blood Alcohol level:  Lab Results  Component Value Date   ETH <10 11/21/2018    Musculoskeletal: Strength & Muscle Tone: within normal limits Gait & Station: normal Patient leans: N/A  Psychiatric Specialty Exam: Physical Exam  Nursing note and vitals reviewed. Constitutional: She is oriented to person, place, and time. She appears well-developed and well-nourished.  HENT:  Head: Normocephalic and atraumatic.  Neck: Normal range of motion.  Respiratory: Effort normal.  Musculoskeletal: Normal range of motion.  Neurological: She is alert and oriented to person, place, and time.  Psychiatric: Her affect is labile. Her speech is tangential. She is agitated. Thought content is paranoid and delusional. Cognition and memory are impaired. She expresses impulsivity.    Review of Systems  Psychiatric/Behavioral: Negative for substance abuse.  All other systems reviewed and are negative.   Blood pressure (!) 145/80, pulse 94, temperature 98.1 F (36.7 C), temperature source Oral, resp. rate 20, SpO2 96 %.There is no height or weight on file to calculate BMI.  General Appearance: Fairly Groomed and Guarded, middle aged, African American female, wearing paper hospital scrubs with short hair who is sitting on the floor in her room. NAD.  Eye Contact:  Fair  Speech:  Clear and Coherent and Normal Rate  Volume:  Normal  Mood:  Dysphoric  Affect:  Labile  Thought Process:  Disorganized and Descriptions of Associations: Tangential  Orientation:  Full (Time, Place, and Person)  Thought Content:  Delusions and Paranoid Ideation  Suicidal Thoughts:  No  Homicidal Thoughts:  No  Memory:  Immediate;   Fair Recent;   Fair Remote;   Fair  Judgement:  Impaired  Insight:  Lacking  Psychomotor Activity:  Increased  Concentration:   Concentration: Fair and Attention Span: Fair  Recall:  [employer]Fair  Fund of Knowledge:  Fair  Language:  Good  Akathisia:  No  Handed:  Right  AIMS (if indicated):   N/A  Assets:  Financial Resources/Insurance Housing Intimacy Social Support  ADL's:  Intact  Cognition: Impaired due to psychiatric condition.  Sleep:   N/A   Assessment:  Stephanie AmabileCharlotte Nhem is a 62 y.o. female who was admitted with psychosis in the setting of suspected UTI. She continues to exhibit paranoia and delusional ideas. She has been agitated and requiring behavioral medications. She warrants inpatient psychiatric hospitalization for stabilization and  treatment.    Treatment Plan Summary: -Continue Zyprexa 5 mg qhs for psychosis/agitation.  -Provide IM Zyprexa 5 mg BID PRN for severe agitation.  -EKG reviewed and QTc 467. Please closely monitor when starting or increasing QTc prolonging agents.    Disposition: Recommend psychiatric Inpatient admission when medically cleared.  Cherly Beach, DO 11/23/2018, 10:29 AM

## 2018-11-23 NOTE — Progress Notes (Signed)
Patient has been laying on the floor in the corner of the room, insisted on not being touched or bothered. NP ordered lab work, pt refused, she is IVC'd so staff obtained lab work, then [employer] gave her an IM zyprexa. Pt was very paranoid, and distrusting of staff prior to incident.

## 2018-11-24 DIAGNOSIS — F23 Brief psychotic disorder: Secondary | ICD-10-CM | POA: Diagnosis not present

## 2018-11-24 LAB — URINALYSIS, ROUTINE W REFLEX MICROSCOPIC
Bilirubin Urine: NEGATIVE
Glucose, UA: NEGATIVE mg/dL
Hgb urine dipstick: NEGATIVE
Ketones, ur: 80 mg/dL — AB
Leukocytes, UA: NEGATIVE
Nitrite: NEGATIVE
Protein, ur: 30 mg/dL — AB
Specific Gravity, Urine: 1.029 (ref 1.005–1.030)
pH: 5 (ref 5.0–8.0)

## 2018-11-24 LAB — RPR: RPR: NONREACTIVE

## 2018-11-24 MED ORDER — STERILE WATER FOR INJECTION IJ SOLN
INTRAMUSCULAR | Status: AC
  Administered 2018-11-24: 10 mL
  Filled 2018-11-24: qty 10

## 2018-11-24 MED ORDER — POTASSIUM CHLORIDE CRYS ER 20 MEQ PO TBCR
40.0000 meq | EXTENDED_RELEASE_TABLET | Freq: Once | ORAL | Status: AC
  Administered 2018-11-25: 40 meq via ORAL
  Filled 2018-11-24: qty 2

## 2018-11-24 NOTE — ED Provider Notes (Signed)
Received call from Janice Coffinom Hughes, Therapeutic, Behavioral health coordinator states [facility]  States that in order for them to accept her, they have stipulated that she must have repeat urinalysis.  Original urine done 12/23 was equivocal with wbc 6-10, and squamous epithelial 11-20 although mod leuko and nitrite negative. Patient given rocephin one gram. No culture done.  Original most likely contaminated.  Catheterized urine ordered   Margarita Grizzleay, Wilmer Berryhill, MD 11/24/18 [phone]

## 2018-11-24 NOTE — Progress Notes (Addendum)
2nd shift ED CSW received a handoff from the 1st shift Disposition.  Per disposition, EPD's note and results from pt's urinalysis needs [facility] via fax: (see below)    To: Verlene MayerAttn Mary ph: [phone]) at [facility] Fax:  [phone].  5:29 PM EPD note faxed.  CSW awaiting results of urinalysis.  CSW will continue to follow for D/C needs.  Dorothe PeaJonathan F. Dvante Hands, LCSW, LCAS, CSI Clinical Social Worker Ph: [phone]

## 2018-11-24 NOTE — ED Notes (Signed)
Patient slung meal tray & bedside table into hallway.

## 2018-11-24 NOTE — BH Assessment (Signed)
[facility] Assessment Progress Note  At 16:30 [facility] [phone]).  She reports that she works with the medical clearance office, and requests pt's most recent vital signs and provider notes.  She also requests a repeat urinalysis.  This writer called EDP Margarita Grizzleanielle Ray, MD with request, and she agrees to submit order.  Pt's most recent vital signs and provider note, along with labs and MAR, have been [facility], and Corrie DandyMary confirms receipt.  Repeat urinalysis is to be faxed to [phone] once results are obtained.    Doylene Canninghomas Labib Cwynar, [place] Behavioral Health Coordinator [phone]

## 2018-11-24 NOTE — ED Notes (Signed)
Pt laying on floor at present, calm & resting at present.  Sitter remains at bedside.

## 2018-11-24 NOTE — ED Notes (Signed)
Stephanie Snyder at bedside.

## 2018-11-24 NOTE — Consult Note (Addendum)
[facility] Psych ED Progress Note  11/24/2018 8:31 AM Stephanie Snyder  MRN:  [id number] Subjective:   62 yo female who continues to be paranoid, calmly sitting on the floor in her room.  Forwards little information.  Inpatient hospitalization being sought.  Principal Problem: Acute psychosis (HCC) Diagnosis:  Principal Problem:   Acute psychosis (HCC)  Total Time spent with patient: 15 minutes  Past Psychiatric History: Denies   Past Medical History:  Past Medical History:  Diagnosis Date  . Arthritis   . Asthma   . CVA (cerebral vascular accident) (HCC) 2008  . Hypertension   . Irregular heart beat 2015    Past Surgical History:  Procedure Laterality Date  . TUBAL LIGATION  1984   Family History:  Family History  Problem Relation Age of Onset  . Cancer Father    Family Psychiatric  History: None per chart review.  Social History:  Social History   Substance and Sexual Activity  Alcohol Use No     Social History   Substance and Sexual Activity  Drug Use No    Social History   Socioeconomic History  . Marital status: Married    Spouse name: Not on file  . Number of children: Not on file  . Years of education: Not on file  . Highest education level: Not on file  Occupational History  . Not on file  Social Needs  . Financial resource strain: Not on file  . Food insecurity:    Worry: Not on file    Inability: Not on file  . Transportation needs:    Medical: Not on file    Non-medical: Not on file  Tobacco Use  . Smoking status: Never Smoker  . Smokeless tobacco: Never Used  Substance and Sexual Activity  . Alcohol use: No  . Drug use: No  . Sexual activity: Not on file  Lifestyle  . Physical activity:    Days per week: Not on file    Minutes per session: Not on file  . Stress: Not on file  Relationships  . Social connections:    Talks on phone: Not on file    Gets together: Not on file    Attends religious service: Not on file    Active member of  club or organization: Not on file    Attends meetings of clubs or organizations: Not on file    Relationship status: Not on file  Other Topics Concern  . Not on file  Social History Narrative  . Not on file    Sleep: Fair  Appetite:  Fair  Current Medications: Current Facility-Administered Medications  Medication Dose Route Frequency Provider Last Rate Last Dose  . acetaminophen (TYLENOL) tablet 650 mg  650 mg Oral Q4H PRN Elpidio Anis, PA-C   650 mg at 11/23/18 2322  . albuterol (PROVENTIL HFA;VENTOLIN HFA) 108 (90 Base) MCG/ACT inhaler 2 puff  2 puff Inhalation Q6H PRN Curatolo, Adam, DO      . amLODipine (NORVASC) tablet 10 mg  10 mg Oral Daily Curatolo, Adam, DO   10 mg at 11/23/18 1004  . lisinopril (PRINIVIL,ZESTRIL) tablet 20 mg  20 mg Oral Daily Curatolo, Adam, DO   20 mg at 11/23/18 1004   And  . hydrochlorothiazide (HYDRODIURIL) tablet 25 mg  25 mg Oral Daily Curatolo, Adam, DO   25 mg at 11/23/18 1004  . OLANZapine (ZYPREXA) injection 5 mg  5 mg Intramuscular BID PRN Cherly Beach, DO   5 mg  at 11/24/18 [id number]  . OLANZapine zydis (ZYPREXA) disintegrating tablet 5 mg  5 mg Oral QHS [name], [name], DO   5 mg at 11/23/18 2139  . [START ON 11/25/2018] potassium chloride SA (K-DUR,KLOR-CON) CR tablet 40 mEq  40 mEq Oral Once Dione BoozeGlick, David, MD       Current Outpatient Medications  Medication Sig Dispense Refill  . albuterol (PROVENTIL HFA;VENTOLIN HFA) 108 (90 Base) MCG/ACT inhaler Inhale 2 puffs into the lungs every 6 (six) hours as needed for wheezing or shortness of breath. 18 g 3  . amLODipine (NORVASC) 10 MG tablet Take 1 tablet (10 mg total) by mouth daily. 90 tablet 2  . diclofenac sodium (VOLTAREN) 1 % GEL Apply 2 g topically 4 (four) times daily. As needed 1 Tube 2  . lisinopril-hydrochlorothiazide (PRINZIDE,ZESTORETIC) 20-25 MG tablet Take 1 tablet by mouth daily. 90 tablet 2  . meloxicam (MOBIC) 7.5 MG tablet Take 1 tablet (7.5 mg total) by mouth daily as  needed for pain. 30 tablet 5  . cetirizine (ZYRTEC) 5 MG tablet Take 1 tablet (5 mg total) by mouth daily. (Patient not taking: Reported on 11/21/2018) 30 tablet 3    Lab Results:  Results for orders placed or performed during the hospital encounter of 11/21/18 (from the past 48 hour(s))  TSH     Status: None   Collection Time: 11/23/18  9:57 AM  Result Value Ref Range   TSH 0.611 0.350 - 4.500 uIU/mL    Comment: Performed by a 3rd Generation assay with a functional sensitivity of <=0.01 uIU/mL. Performed at [facility], [address] [place], [place] [address]   Vitamin B12     Status: None   Collection Time: 11/23/18  9:57 AM  Result Value Ref Range   Vitamin B-12 228 180 - 914 pg/mL    Comment: (NOTE) This assay is not validated for testing neonatal or myeloproliferative syndrome specimens for Vitamin B12 levels. Performed at [facility], [address] [place], [place] [address]     Blood Alcohol level:  Lab Results  Component Value Date   [facility] <10 11/21/2018    Musculoskeletal: Strength & Muscle Tone: within normal limits Gait & Station: normal Patient leans: N/A  Psychiatric Specialty Exam: Physical Exam  Nursing note and vitals reviewed. Constitutional: She is oriented to person, place, and time. She appears well-developed and well-nourished.  HENT:  Head: Normocephalic and atraumatic.  Neck: Normal range of motion.  Respiratory: Effort normal.  Musculoskeletal: Normal range of motion.  Neurological: She is alert and oriented to person, place, and time.  Psychiatric: Her affect is labile. Her speech is tangential. She is agitated. Thought content is paranoid and delusional. Cognition and memory are impaired. She expresses impulsivity.    Review of Systems  Psychiatric/Behavioral: Negative for substance abuse.  All other systems reviewed and are negative.   Blood pressure 99/67, pulse 62, temperature 98.7 F (37.1  C), temperature source Oral, resp. rate 19, SpO2 97 %.There is no height or weight on file to calculate BMI.  General Appearance: Fairly Groomed and Guarded, middle aged, African American female, wearing paper hospital scrubs with short hair who is sitting on the floor in her room. NAD.  Eye Contact:  Fair  Speech:  Clear and Coherent and Normal Rate  Volume:  Normal  Mood:  Dysphoric  Affect:  Labile  Thought Process:  Disorganized and Descriptions of Associations: Tangential  Orientation:  Full (Time, Place, and Person)  Thought  Content:  Delusions and Paranoid Ideation  Suicidal Thoughts:  No  Homicidal Thoughts:  No  Memory:  Immediate;   Fair Recent;   Fair Remote;   Fair  Judgement:  Impaired  Insight:  Lacking  Psychomotor Activity:  Increased  Concentration:  Concentration: Fair and Attention Span: Fair  Recall:  [employer]Fair  Fund of Knowledge:  Fair  Language:  Good  Akathisia:  No  Handed:  Right  AIMS (if indicated):   N/A  Assets:  Financial Resources/Insurance Housing Intimacy Social Support  ADL's:  Intact  Cognition: Impaired due to psychiatric condition.  Sleep:   N/A   Assessment:  Stephanie Snyder is a 62 y.o. female who was admitted with psychosis in the setting of suspected UTI. She continues to exhibit paranoia and delusional ideas. She has been agitated and requiring behavioral medications. She warrants inpatient psychiatric hospitalization for stabilization and treatment.    Treatment Plan Summary: Acute Psychosis: -Continue Zyprexa 5 mg qhs for psychosis/agitation.  -Provide IM Zyprexa 5 mg BID PRN for severe agitation.  -EKG reviewed and QTc 467. Please closely monitor when starting or increasing QTc prolonging agents.  -Ordered RPR   Disposition: Recommend psychiatric Inpatient admission when medically cleared.  Nanine MeansLORD, JAMISON, NP 11/24/2018, 8:31 AM   Patient seen face-to-face for psychiatric evaluation, chart reviewed and case discussed with the  physician extender and developed treatment plan. Reviewed the information documented and agree with the treatment plan.  Juanetta BeetsJacqueline Lian Tanori, DO 11/24/18 4:33 PM

## 2018-11-24 NOTE — ED Notes (Signed)
Patient signed consent for release of inform for Stephanie ShamsJason Snyder (fiance).

## 2018-11-24 NOTE — ED Notes (Signed)
Patient motioned for writer to come into room.  Patient initially did not speak but eventually asked writer, "Is there anywhere I can go to get some clean clothes?"  Writer advised that she could shower here and get a new pair of scrubs.  Patient then pouted and asked, "What day is it?  Do I have to stay here any longer?"

## 2018-11-24 NOTE — ED Notes (Signed)
Patient has refused all meals and drinks today.

## 2018-11-24 NOTE — ED Notes (Signed)
Psych provider at bedside

## 2018-11-24 NOTE — BH Assessment (Addendum)
[facility]  Per Juanetta BeetsJacqueline Norman, DO, this pt continues to require psychiatric hospitalization at this time.  The following facilities have been contacted today to seek placement for this pt, with results as noted:  Beds available, information sent, decision pending:  [facility] (has agreed to accept updated referral information) [facility] (has agreed to accept updated referral information) [facility]   Declined:  [facility] (due to pt aggression) [facility] (due to pt aggression)   At capacity:  [facility] (did not receive referral information on 12/25) [facility]   At 09:55 I called [facility] and spoke to [place].  He reports that pt is currently under medical review.  Final decision is pending.  Doylene Canninghomas Treniya Lobb, [place] Behavioral Health Coordinator [phone]

## 2018-11-24 NOTE — ED Notes (Addendum)
Notified Dr. Preston FleetingGlick of pt not receiving the potassium ordered on 11/21/18 d/t drowsiness.  Verbal order received.  See orders.

## 2018-11-24 NOTE — Progress Notes (Signed)
Pt's urinalysis faxed and EDP's note regarding pt's urinalysis [facility].  Please reconsult if future social work needs arise.  CSW signing off, as social work intervention is no longer needed.  Dorothe PeaJonathan F. Alesana Magistro, LCSW, LCAS, CSI Clinical Social Worker Ph: [phone]

## 2018-11-24 NOTE — ED Notes (Signed)
Pt awake, alert & responsive, no distress noted, calm & cooperative at present.  Resting at present.  Monitoring for safety, Sitter at bedside.

## 2018-11-25 DIAGNOSIS — F23 Brief psychotic disorder: Secondary | ICD-10-CM

## 2018-11-25 MED ORDER — HALOPERIDOL LACTATE 5 MG/ML IJ SOLN: 5.0000 mg | Freq: Four times a day (QID) | INTRAMUSCULAR | Status: DC | PRN

## 2018-11-25 MED ORDER — STERILE WATER FOR INJECTION IJ SOLN
INTRAMUSCULAR | Status: AC
  Administered 2018-11-25: 10 mL
  Filled 2018-11-25: qty 10

## 2018-11-25 MED ORDER — RISPERIDONE 1 MG PO TABS
1.0000 mg | ORAL_TABLET | Freq: Two times a day (BID) | ORAL | Status: DC
  Administered 2018-11-26: 1 mg via ORAL
  Filled 2018-11-25 (×3): qty 1

## 2018-11-25 MED ORDER — DIPHENHYDRAMINE HCL 50 MG/ML IJ SOLN
50.0000 mg | Freq: Once | INTRAMUSCULAR | Status: AC
  Administered 2018-11-25: 50 mg via INTRAMUSCULAR
  Filled 2018-11-25: qty 1

## 2018-11-25 MED ORDER — DIVALPROEX SODIUM 250 MG PO DR TAB
250.0000 mg | DELAYED_RELEASE_TABLET | Freq: Two times a day (BID) | ORAL | Status: DC
  Administered 2018-11-26 – 2018-11-29 (×6): 250 mg via ORAL
  Filled 2018-11-25 (×10): qty 1

## 2018-11-25 MED ORDER — LORAZEPAM 2 MG/ML IJ SOLN
2.0000 mg | Freq: Once | INTRAMUSCULAR | Status: AC
  Administered 2018-11-25: 2 mg via INTRAMUSCULAR
  Filled 2018-11-25: qty 1

## 2018-11-25 NOTE — ED Notes (Signed)
Report was given pt was being combative. Vital signs will be taken when pt wakes up.

## 2018-11-25 NOTE — ED Notes (Addendum)
Pt refuse to have VT's taken says "someone is trying to kill me" and told me to get out of her room and leave her alone in a hateful voice. Will try back later. RN made aware of it.

## 2018-11-25 NOTE — Progress Notes (Signed)
Pt. Threatening staff, attempting to leave unit. Very aggressive and agitated. NP notified.

## 2018-11-25 NOTE — ED Notes (Signed)
Pt. Tore a piece of wallpaper on wall in bedroom.

## 2018-11-25 NOTE — ED Notes (Signed)
Pt very paranoid, thinking someone is out to kill her and her husband. Redirected back into room, sitting on side of bed at present.  Sitter remains at bedside at present.  Monitoring for safety.

## 2018-11-25 NOTE — Consult Note (Addendum)
[facility] Psych ED Progress Note  11/25/2018 10:03 AM Stephanie Snyder  MRN:  [id number] Subjective:   62 yo female who continues to be paranoid, calmly sitting on the floor in her room.  Forwards little information, nods her head. Paranoid.  RPR is negative, boyfriend reporting long history of paranoia and mother with dementia.  Medications changed based on incoming information.  Inpatient hospitalization being sought.  Principal Problem: Acute psychosis (HCC) Diagnosis:  Principal Problem:   Acute psychosis (HCC)  Total Time spent with patient: 15 minutes  Past Psychiatric History: Denies   Past Medical History:  Past Medical History:  Diagnosis Date  . Arthritis   . Asthma   . CVA (cerebral vascular accident) (HCC) 2008  . Hypertension   . Irregular heart beat 2015    Past Surgical History:  Procedure Laterality Date  . TUBAL LIGATION  1984   Family History:  Family History  Problem Relation Age of Onset  . Cancer Father    Family Psychiatric  History: Mother-dementia  Social History:  Social History   Substance and Sexual Activity  Alcohol Use No     Social History   Substance and Sexual Activity  Drug Use No    Social History   Socioeconomic History  . Marital status: Married    Spouse name: Not on file  . Number of children: Not on file  . Years of education: Not on file  . Highest education level: Not on file  Occupational History  . Not on file  Social Needs  . Financial resource strain: Not on file  . Food insecurity:    Worry: Not on file    Inability: Not on file  . Transportation needs:    Medical: Not on file    Non-medical: Not on file  Tobacco Use  . Smoking status: Never Smoker  . Smokeless tobacco: Never Used  Substance and Sexual Activity  . Alcohol use: No  . Drug use: No  . Sexual activity: Not on file  Lifestyle  . Physical activity:    Days per week: Not on file    Minutes per session: Not on file  . Stress: Not on file   Relationships  . Social connections:    Talks on phone: Not on file    Gets together: Not on file    Attends religious service: Not on file    Active member of club or organization: Not on file    Attends meetings of clubs or organizations: Not on file    Relationship status: Not on file  Other Topics Concern  . Not on file  Social History Narrative  . Not on file    Sleep: Fair  Appetite:  Fair  Current Medications: Current Facility-Administered Medications  Medication Dose Route Frequency Provider Last Rate Last Dose  . acetaminophen (TYLENOL) tablet 650 mg  650 mg Oral Q4H PRN Elpidio Anis, PA-C   650 mg at 11/23/18 2322  . albuterol (PROVENTIL HFA;VENTOLIN HFA) 108 (90 Base) MCG/ACT inhaler 2 puff  2 puff Inhalation Q6H PRN Curatolo, Adam, DO      . amLODipine (NORVASC) tablet 10 mg  10 mg Oral Daily Curatolo, Adam, DO   10 mg at 11/23/18 1004  . divalproex (DEPAKOTE) DR tablet 250 mg  250 mg Oral Q12H Lord, Stephanie Snyder Y, Stephanie Snyder      . lisinopril (PRINIVIL,ZESTRIL) tablet 20 mg  20 mg Oral Daily Curatolo, Adam, DO   20 mg at 11/23/18 1004   And  .  hydrochlorothiazide (HYDRODIURIL) tablet 25 mg  25 mg Oral Daily Curatolo, Adam, DO   25 mg at 11/23/18 1004  . OLANZapine (ZYPREXA) injection 5 mg  5 mg Intramuscular BID PRN [name], Devon Pretty J, DO   5 mg at 11/24/18 [id number]  . risperiDONE (RISPERDAL) tablet 1 mg  1 mg Oral BID Charm RingsLord, Stephanie Snyder Y, Stephanie Snyder       Current Outpatient Medications  Medication Sig Dispense Refill  . albuterol (PROVENTIL HFA;VENTOLIN HFA) 108 (90 Base) MCG/ACT inhaler Inhale 2 puffs into the lungs every 6 (six) hours as needed for wheezing or shortness of breath. 18 g 3  . amLODipine (NORVASC) 10 MG tablet Take 1 tablet (10 mg total) by mouth daily. 90 tablet 2  . diclofenac sodium (VOLTAREN) 1 % GEL Apply 2 g topically 4 (four) times daily. As needed 1 Tube 2  . lisinopril-hydrochlorothiazide (PRINZIDE,ZESTORETIC) 20-25 MG tablet Take 1 tablet by mouth daily. 90  tablet 2  . meloxicam (MOBIC) 7.5 MG tablet Take 1 tablet (7.5 mg total) by mouth daily as needed for pain. 30 tablet 5  . cetirizine (ZYRTEC) 5 MG tablet Take 1 tablet (5 mg total) by mouth daily. (Patient not taking: Reported on 11/21/2018) 30 tablet 3    Lab Results:  Results for orders placed or performed during the hospital encounter of 11/21/18 (from the past 48 hour(s))  Urinalysis, Routine w reflex microscopic     Status: Abnormal   Collection Time: 11/24/18  4:26 PM  Result Value Ref Range   Color, Urine YELLOW YELLOW   APPearance HAZY (A) CLEAR   Specific Gravity, Urine 1.029 1.005 - 1.030   pH 5.0 5.0 - 8.0   Glucose, UA NEGATIVE NEGATIVE mg/dL   Hgb urine dipstick NEGATIVE NEGATIVE   Bilirubin Urine NEGATIVE NEGATIVE   Ketones, ur 80 (A) NEGATIVE mg/dL   Protein, ur 30 (A) NEGATIVE mg/dL   Nitrite NEGATIVE NEGATIVE   Leukocytes, UA NEGATIVE NEGATIVE   RBC / HPF 0-5 0 - 5 RBC/hpf   WBC, UA 0-5 0 - 5 WBC/hpf   Bacteria, UA RARE (A) NONE SEEN   Squamous Epithelial / LPF 11-20 0 - 5   Mucus PRESENT    Hyaline Casts, UA PRESENT     Comment: Performed at [facility], [address] [place], [place] [address]    Blood Alcohol level:  Lab Results  Component Value Date   [facility] <10 11/21/2018    Musculoskeletal: Strength & Muscle Tone: within normal limits Gait & Station: normal Patient leans: N/A  Psychiatric Specialty Exam: Physical Exam  Nursing note and vitals reviewed. Constitutional: She is oriented to person, place, and time. She appears well-developed and well-nourished.  HENT:  Head: Normocephalic and atraumatic.  Neck: Normal range of motion.  Respiratory: Effort normal.  Musculoskeletal: Normal range of motion.  Neurological: She is alert and oriented to person, place, and time.  Psychiatric: Her affect is labile. Her speech is tangential. She is agitated. Thought content is paranoid and delusional. Cognition and memory are impaired.  She expresses impulsivity.    Review of Systems  Psychiatric/Behavioral: Negative for substance abuse.  All other systems reviewed and are negative.   Blood pressure (!) 149/100, pulse (!) 105, temperature 98.3 F (36.8 C), temperature source Oral, resp. rate 17, SpO2 98 %.There is no height or weight on file to calculate BMI.  General Appearance: Fairly Groomed and Guarded, middle aged, African American female, wearing paper hospital scrubs with short hair who is sitting  on the floor in her room. NAD.  Eye Contact:  Fair  Speech:  Clear and Coherent and Normal Rate  Volume:  Normal  Mood:  Dysphoric  Affect:  Labile  Thought Process:  Disorganized and Descriptions of Associations: Tangential  Orientation:  Full (Time, Place, and Person)  Thought Content:  Delusions and Paranoid Ideation  Suicidal Thoughts:  No  Homicidal Thoughts:  No  Memory:  Immediate;   Fair Recent;   Fair Remote;   Fair  Judgement:  Impaired  Insight:  Lacking  Psychomotor Activity:  Increased  Concentration:  Concentration: Fair and Attention Span: Fair  Recall:  [employer]  Fund of Knowledge:  Fair  Language:  Good  Akathisia:  No  Handed:  Right  AIMS (if indicated):   N/A  Assets:  Financial Resources/Insurance Housing Intimacy Social Support  ADL's:  Intact  Cognition: Impaired due to psychiatric condition.  Sleep:   N/A   Assessment:  Stephanie Snyder is a 62 y.o. female who was admitted with psychosis in the setting of suspected UTI. She continues to exhibit paranoia and delusional ideas. She has been agitated and requiring behavioral medications. She warrants inpatient psychiatric hospitalization for stabilization and treatment.    Treatment Plan Summary: Acute Psychosis: -Discontinued Zyprexa 5 mg qhs for psychosis/agitation.  -Provide IM Zyprexa 5 mg BID PRN for severe agitation.  -Start Depakote 250 mg BID for mood stabilization -Start Risperdal 1 mg BID for psychosis -EKG reviewed and QTc  467. Please closely monitor when starting or increasing QTc prolonging agents.  -RPR negative  Disposition: Recommend psychiatric Inpatient admission when medically cleared.  Stephanie Snyder, JAMISON, Stephanie Snyder 11/25/2018, 10:03 AM    Patient seen face-to-face for psychiatric evaluation, chart reviewed and case discussed with the physician extender and developed treatment plan. Reviewed the information documented and agree with the treatment plan.  Juanetta BeetsJacqueline Andriana Casa, DO 11/25/18 12:23 PM

## 2018-11-25 NOTE — ED Notes (Signed)
Pt  is resting at this time, I will continue to monitor.

## 2018-11-25 NOTE — Progress Notes (Signed)
Patient became very agitated and swinging at staff.  Security called, patient yelling and paranoid.  Agitation medications required.  When medications were attempted to be given, swinging with closed fists.  Security was able to restrain her for medications safely.  Placement needed for stabilization.  Nanine MeansJamison Lord, PMHNP
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
## 2018-11-25 NOTE — ED Notes (Signed)
Pt. Took her BP medications.

## 2018-11-26 DIAGNOSIS — F23 Brief psychotic disorder: Secondary | ICD-10-CM | POA: Diagnosis not present

## 2018-11-26 MED ORDER — HALOPERIDOL LACTATE 5 MG/ML IJ SOLN
5.0000 mg | Freq: Four times a day (QID) | INTRAMUSCULAR | Status: DC | PRN
  Administered 2018-11-26: 5 mg via INTRAMUSCULAR
  Filled 2018-11-26: qty 1

## 2018-11-26 MED ORDER — HALOPERIDOL 5 MG PO TABS
10.0000 mg | ORAL_TABLET | Freq: Two times a day (BID) | ORAL | Status: DC
  Administered 2018-11-26 – 2018-11-27 (×2): 10 mg via ORAL
  Filled 2018-11-26 (×3): qty 2

## 2018-11-26 MED ORDER — BENZTROPINE MESYLATE 1 MG PO TABS
1.0000 mg | ORAL_TABLET | Freq: Every day | ORAL | Status: DC
  Administered 2018-11-26 – 2018-11-27 (×2): 1 mg via ORAL
  Filled 2018-11-26 (×2): qty 1

## 2018-11-26 NOTE — ED Notes (Signed)
Patient has poop all over toilet, walls, and pants. Refuses to be touched. Refuses bath.

## 2018-11-26 NOTE — ED Notes (Signed)
Patient refused bath. 

## 2018-11-26 NOTE — Consult Note (Signed)
[facility]  11/26/2018 11:28 AM Stephanie Snyder  MRN:  [id number] Subjective:   [age] yo female appears to be better, calmer and staying in her bed. She still continues to bath and remains paranoid.  Minimal verbal interaction.  Principal Problem: Acute psychosis (HCC) Diagnosis:  Principal Problem:   Acute psychosis (HCC)  Total Time spent with patient: 15 minutes  Past Psychiatric History: Denies   Past Medical History:  Past Medical History:  Diagnosis Date  . Arthritis   . Asthma   . CVA (cerebral vascular accident) (HCC) 2008  . Hypertension   . Irregular heart beat 2015    Past Surgical History:  Procedure Laterality Date  . TUBAL LIGATION  1984   Family History:  Family History  Problem Relation Age of Onset  . Cancer Father    Family Psychiatric  History: Mother-dementia  Social History:  Social History   Substance and Sexual Activity  Alcohol Use No     Social History   Substance and Sexual Activity  Drug Use No    Social History   Socioeconomic History  . Marital status: Married    Spouse name: Not on file  . Number of children: Not on file  . Years of education: Not on file  . Highest education level: Not on file  Occupational History  . Not on file  Social Needs  . Financial resource strain: Not on file  . Food insecurity:    Worry: Not on file    Inability: Not on file  . Transportation needs:    Medical: Not on file    Non-medical: Not on file  Tobacco Use  . Smoking status: Never Smoker  . Smokeless tobacco: Never Used  Substance and Sexual Activity  . Alcohol use: No  . Drug use: No  . Sexual activity: Not on file  Lifestyle  . Physical activity:    Days per week: Not on file    Minutes per session: Not on file  . Stress: Not on file  Relationships  . Social connections:    Talks on phone: Not on file    Gets together: Not on file    Attends religious service: Not on file    Active member of club or  organization: Not on file    Attends meetings of clubs or organizations: Not on file    Relationship status: Not on file  Other Topics Concern  . Not on file  Social History Narrative  . Not on file    Sleep: Fair  Appetite:  Fair  Current Medications: Current Facility-Administered Medications  Medication Dose Route Frequency Provider Last Rate Last Dose  . acetaminophen (TYLENOL) tablet 650 mg  650 mg Oral Q4H PRN Elpidio AnisUpstill, Shari, PA-C   650 mg at 11/23/18 2322  . albuterol (PROVENTIL HFA;VENTOLIN HFA) 108 (90 Base) MCG/ACT inhaler 2 puff  2 puff Inhalation Q6H PRN Curatolo, Adam, DO      . amLODipine (NORVASC) tablet 10 mg  10 mg Oral Daily Curatolo, Adam, DO   10 mg at 11/26/18 [id number]  . benztropine (COGENTIN) tablet 1 mg  1 mg Oral Daily Susi Goslin Y, NP      . divalproex (DEPAKOTE) DR tablet 250 mg  250 mg Oral Q12H Charm RingsLord, Liston Thum Y, NP   250 mg at 11/26/18 [id number]  . haloperidol (HALDOL) tablet 10 mg  10 mg Oral BID Charm RingsLord, Yareth Macdonnell Y, NP      . haloperidol lactate (HALDOL) injection 5  mg  5 mg Intramuscular Q6H PRN Charm RingsLord, Percilla Tweten Y, NP      . lisinopril (PRINIVIL,ZESTRIL) tablet 20 mg  20 mg Oral Daily Curatolo, Adam, DO   20 mg at 11/26/18 [id number]   And  . hydrochlorothiazide (HYDRODIURIL) tablet 25 mg  25 mg Oral Daily Curatolo, Adam, DO   25 mg at 11/26/18 0930   Current Outpatient Medications  Medication Sig Dispense Refill  . albuterol (PROVENTIL HFA;VENTOLIN HFA) 108 (90 Base) MCG/ACT inhaler Inhale 2 puffs into the lungs every 6 (six) hours as needed for wheezing or shortness of breath. 18 g 3  . amLODipine (NORVASC) 10 MG tablet Take 1 tablet (10 mg total) by mouth daily. 90 tablet 2  . diclofenac sodium (VOLTAREN) 1 % GEL Apply 2 g topically 4 (four) times daily. As needed 1 Tube 2  . lisinopril-hydrochlorothiazide (PRINZIDE,ZESTORETIC) 20-25 MG tablet Take 1 tablet by mouth daily. 90 tablet 2  . meloxicam (MOBIC) 7.5 MG tablet Take 1 tablet (7.5 mg total) by mouth daily as  needed for pain. 30 tablet 5  . cetirizine (ZYRTEC) 5 MG tablet Take 1 tablet (5 mg total) by mouth daily. (Patient not taking: Reported on 11/21/2018) 30 tablet 3    Lab Results:  Results for orders placed or performed during the hospital encounter of 11/21/18 (from the past 48 hour(s))  Urinalysis, Routine w reflex microscopic     Status: Abnormal   Collection Time: 11/24/18  4:26 PM  Result Value Ref Range   Color, Urine YELLOW YELLOW   APPearance HAZY (A) CLEAR   Specific Gravity, Urine 1.029 1.005 - 1.030   pH 5.0 5.0 - 8.0   Glucose, UA NEGATIVE NEGATIVE mg/dL   Hgb urine dipstick NEGATIVE NEGATIVE   Bilirubin Urine NEGATIVE NEGATIVE   Ketones, ur 80 (A) NEGATIVE mg/dL   Protein, ur 30 (A) NEGATIVE mg/dL   Nitrite NEGATIVE NEGATIVE   Leukocytes, UA NEGATIVE NEGATIVE   RBC / HPF 0-5 0 - 5 RBC/hpf   WBC, UA 0-5 0 - 5 WBC/hpf   Bacteria, UA RARE (A) NONE SEEN   Squamous Epithelial / LPF 11-20 0 - 5   Mucus PRESENT    Hyaline Casts, UA PRESENT     Comment: Performed at [facility], [address] [place], [place] [address]    Blood Alcohol level:  Lab Results  Component Value Date   [facility] 11/21/2018    Musculoskeletal: Strength & Muscle Tone: within normal limits Gait & Station: normal Patient leans: N/A  Psychiatric Specialty Exam: Physical Exam  Nursing note and vitals reviewed. Constitutional: She is oriented to person, place, and time. She appears well-developed and well-nourished.  HENT:  Head: Normocephalic and atraumatic.  Neck: Normal range of motion.  Respiratory: Effort normal.  Musculoskeletal: Normal range of motion.  Neurological: She is alert and oriented to person, place, and time.  Psychiatric: Her affect is labile. Her speech is tangential. She is agitated. Thought content is paranoid and delusional. Cognition and memory are impaired. She expresses impulsivity.    Review of Systems  Psychiatric/Behavioral: Negative for  substance abuse.  All other systems reviewed and are negative.   Blood pressure 121/82, pulse 88, temperature 98.7 F (37.1 C), temperature source Oral, resp. rate 16, SpO2 100 %.There is no height or weight on file to calculate BMI.  General Appearance: Fairly Groomed and Guarded, middle aged, African American female, wearing paper hospital scrubs with short hair who is sitting on the floor in  her room. NAD.  Eye Contact:  Fair  Speech:  Clear and Coherent and Normal Rate  Volume:  Normal  Mood:  Dysphoric  Affect:  Labile  Thought Process:  Disorganized and Descriptions of Associations: Tangential  Orientation:  Full (Time, Place, and Person)  Thought Content:  Delusions and Paranoid Ideation  Suicidal Thoughts:  No  Homicidal Thoughts:  No  Memory:  Immediate;   Fair Recent;   Fair Remote;   Fair  Judgement:  Impaired  Insight:  Lacking  Psychomotor Activity:  Increased  Concentration:  Concentration: Fair and Attention Span: Fair  Recall:  [employer] of Knowledge:  Fair  Language:  Good  Akathisia:  No  Handed:  Right  AIMS (if indicated):   N/A  Assets:  Financial Resources/Insurance Housing Intimacy Social Support  ADL's:  Intact  Cognition: Impaired due to psychiatric condition.  Sleep:   N/A   Assessment:  Stephanie Snyder is a 62 y.o. female who was admitted with psychosis in the setting of suspected UTI. She continues to exhibit paranoia and delusional ideas. She has been agitated and requiring behavioral medications. She warrants inpatient psychiatric hospitalization for stabilization and treatment.    Treatment Plan Summary: Acute Psychosis: -Discontinued Zyprexa 5 mg qhs for psychosis/agitation.  -Discontinue IM Zyprexa 5 mg BID PRN for severe agitation.  -Continue Depakote 250 mg BID for mood stabilization -Discontinue Risperdal 1 mg BID for psychosis -Start Haldol 10 mg BID for psychosis -Start Haldol 5 mg IM every six hours PRN agitation  Disposition:  Recommend psychiatric Inpatient admission when medically cleared.  Nanine Means, NP 11/26/2018, 11:28 AM    Patient seen face-to-face for psychiatric evaluation, chart reviewed and case discussed with the physician extender and developed treatment plan. Reviewed the information documented and agree with the treatment plan.  Juanetta Beets, DO 11/26/18 11:28 AM

## 2018-11-27 DIAGNOSIS — F23 Brief psychotic disorder: Secondary | ICD-10-CM | POA: Diagnosis not present

## 2018-11-27 MED ORDER — BENZTROPINE MESYLATE 0.5 MG PO TABS
0.5000 mg | ORAL_TABLET | Freq: Two times a day (BID) | ORAL | Status: DC
  Administered 2018-11-27 – 2018-11-29 (×4): 0.5 mg via ORAL
  Filled 2018-11-27 (×4): qty 1

## 2018-11-27 MED ORDER — HALOPERIDOL 5 MG PO TABS
5.0000 mg | ORAL_TABLET | Freq: Two times a day (BID) | ORAL | Status: DC
  Administered 2018-11-27 – 2018-11-29 (×4): 5 mg via ORAL
  Filled 2018-11-27 (×4): qty 1

## 2018-11-27 NOTE — Consult Note (Signed)
[facility] Psych ED Progress Note  11/27/2018 1:23 PM Stephanie Snyder  MRN:  [id number] Subjective:   62 yo female appears to be better, calmer and staying in her bed. She is verbally aggressive at times, paranoia continues. Minimal verbal interaction.  Principal Problem: Acute psychosis (HCC) Diagnosis:  Principal Problem:   Acute psychosis (HCC)  Total Time spent with patient: 15 minutes  Past Psychiatric History: Denies   Past Medical History:  Past Medical History:  Diagnosis Date  . Arthritis   . Asthma   . CVA (cerebral vascular accident) (HCC) 2008  . Hypertension   . Irregular heart beat 2015    Past Surgical History:  Procedure Laterality Date  . TUBAL LIGATION  1984   Family History:  Family History  Problem Relation Age of Onset  . Cancer Father    Family Psychiatric  History: Mother-dementia  Social History:  Social History   Substance and Sexual Activity  Alcohol Use No     Social History   Substance and Sexual Activity  Drug Use No    Social History   Socioeconomic History  . Marital status: Married    Spouse name: Not on file  . Number of children: Not on file  . Years of education: Not on file  . Highest education level: Not on file  Occupational History  . Not on file  Social Needs  . Financial resource strain: Not on file  . Food insecurity:    Worry: Not on file    Inability: Not on file  . Transportation needs:    Medical: Not on file    Non-medical: Not on file  Tobacco Use  . Smoking status: Never Smoker  . Smokeless tobacco: Never Used  Substance and Sexual Activity  . Alcohol use: No  . Drug use: No  . Sexual activity: Not on file  Lifestyle  . Physical activity:    Days per week: Not on file    Minutes per session: Not on file  . Stress: Not on file  Relationships  . Social connections:    Talks on phone: Not on file    Gets together: Not on file    Attends religious service: Not on file    Active member of club or  organization: Not on file    Attends meetings of clubs or organizations: Not on file    Relationship status: Not on file  Other Topics Concern  . Not on file  Social History Narrative  . Not on file    Sleep: Fair  Appetite:  Fair  Current Medications: Current Facility-Administered Medications  Medication Dose Route Frequency Provider Last Rate Last Dose  . acetaminophen (TYLENOL) tablet 650 mg  650 mg Oral Q4H PRN Elpidio Anis, PA-C   650 mg at 11/23/18 2322  . albuterol (PROVENTIL HFA;VENTOLIN HFA) 108 (90 Base) MCG/ACT inhaler 2 puff  2 puff Inhalation Q6H PRN Curatolo, Adam, DO      . amLODipine (NORVASC) tablet 10 mg  10 mg Oral Daily Curatolo, Adam, DO   10 mg at 11/27/18 1020  . benztropine (COGENTIN) tablet 1 mg  1 mg Oral Daily Charm Rings, NP   1 mg at 11/27/18 1021  . divalproex (DEPAKOTE) DR tablet 250 mg  250 mg Oral Q12H Charm Rings, NP   250 mg at 11/27/18 1021  . haloperidol (HALDOL) tablet 10 mg  10 mg Oral BID Charm Rings, NP   10 mg at 11/27/18 1021  .  haloperidol lactate (HALDOL) injection 5 mg  5 mg Intramuscular Q6H PRN Charm RingsLord, Herman Mell Y, NP   5 mg at 11/26/18 2257  . lisinopril (PRINIVIL,ZESTRIL) tablet 20 mg  20 mg Oral Daily Curatolo, Adam, DO   20 mg at 11/27/18 1022   And  . hydrochlorothiazide (HYDRODIURIL) tablet 25 mg  25 mg Oral Daily Curatolo, Adam, DO   25 mg at 11/27/18 1023   Current Outpatient Medications  Medication Sig Dispense Refill  . albuterol (PROVENTIL HFA;VENTOLIN HFA) 108 (90 Base) MCG/ACT inhaler Inhale 2 puffs into the lungs every 6 (six) hours as needed for wheezing or shortness of breath. 18 g 3  . amLODipine (NORVASC) 10 MG tablet Take 1 tablet (10 mg total) by mouth daily. 90 tablet 2  . diclofenac sodium (VOLTAREN) 1 % GEL Apply 2 g topically 4 (four) times daily. As needed 1 Tube 2  . lisinopril-hydrochlorothiazide (PRINZIDE,ZESTORETIC) 20-25 MG tablet Take 1 tablet by mouth daily. 90 tablet 2  . meloxicam (MOBIC) 7.5  MG tablet Take 1 tablet (7.5 mg total) by mouth daily as needed for pain. 30 tablet 5  . cetirizine (ZYRTEC) 5 MG tablet Take 1 tablet (5 mg total) by mouth daily. (Patient not taking: Reported on 11/21/2018) 30 tablet 3    Lab Results:  No results found for this or any previous visit (from the past 48 hour(s)).  Blood Alcohol level:  Lab Results  Component Value Date   ETH <10 11/21/2018    Musculoskeletal: Strength & Muscle Tone: within normal limits Gait & Station: normal Patient leans: N/A  Psychiatric Specialty Exam: Physical Exam  Nursing note and vitals reviewed. Constitutional: She appears well-developed and well-nourished.  HENT:  Head: Normocephalic and atraumatic.  Neck: Normal range of motion.  Respiratory: Effort normal.  Musculoskeletal: Normal range of motion.  Neurological: She is alert.  Psychiatric: Her affect is labile. Her speech is tangential. She is agitated. Thought content is paranoid and delusional. Cognition and memory are impaired. She expresses impulsivity.    Review of Systems  Psychiatric/Behavioral: Negative for substance abuse.  All other systems reviewed and are negative.   Blood pressure 116/67, pulse (!) 105, temperature 98.5 F (36.9 C), temperature source Oral, resp. rate 18, SpO2 96 %.There is no height or weight on file to calculate BMI.  General Appearance: Fairly Groomed and Guarded, middle aged, African American female, wearing paper hospital scrubs with short hair who is sitting on the floor in her room. NAD.  Eye Contact:  Fair  Speech:  Clear and Coherent and Normal Rate  Volume:  Normal  Mood:  Dysphoric  Affect:  Labile  Thought Process:  Disorganized and Descriptions of Associations: Tangential  Orientation:  Full (Time, Place, and Person)  Thought Content:  Delusions and Paranoid Ideation  Suicidal Thoughts:  No  Homicidal Thoughts:  No  Memory:  Immediate;   Fair Recent;   Fair Remote;   Fair  Judgement:  Impaired   Insight:  Lacking  Psychomotor Activity:  Increased  Concentration:  Concentration: Fair and Attention Span: Fair  Recall:  [employer]  Fund of Knowledge:  Fair  Language:  Good  Akathisia:  No  Handed:  Right  AIMS (if indicated):   N/A  Assets:  Financial Resources/Insurance Housing Intimacy Social Support  ADL's:  Intact  Cognition: Impaired due to psychiatric condition.  Sleep:   N/A   Assessment:  Stephanie Snyder is a 62 y.o. female who was admitted with psychosis in the setting of suspected  dementia She continues to exhibit paranoia and delusional ideas. She has been agitated and requiring behavioral medications. She warrants inpatient psychiatric hospitalization for stabilization and treatment.    Treatment Plan Summary: Acute Psychosis: -Continue Depakote 250 mg BID for mood stabilization -Decrease Haldol 10 mg BID for psychosis to 5 mg BID -Continue Haldol 5 mg IM every six hours PRN agitation  EPS: -Start Cogentin 0.5 mg BID  Disposition: Recommend psychiatric Inpatient admission when medically cleared.  Stephanie Snyder, Stephanie Weiland, NP 11/27/2018, 1:23 PM    Patient seen face-to-face for psychiatric evaluation, chart reviewed and case discussed with the physician extender and developed treatment plan. Reviewed the information documented and agree with the treatment plan.

## 2018-11-27 NOTE — ED Notes (Signed)
Patient asleep most of the shift.  Patient ate at breakfast, ate little at lunch and now as sitter was assisting in her set up patient did not act interested and went back to sleep.

## 2018-11-27 NOTE — ED Notes (Signed)
Report received from Taquita RN. 

## 2018-11-27 NOTE — ED Notes (Signed)
Pt sleeping at present. Sitter at bedside, awake, alert & responsive, no distress noted, calm & cooperative at present.  Monitoring for safety.

## 2018-11-27 NOTE — ED Notes (Signed)
Pt continues to pace around the room, at times she can be found siting on the floor in her room. States that she is looking for her dog, refuses staff assiance to help with ADL's, pt has refused her night time meds, Haldol mg IM given for agitation sitter at the bedside. I will continue to monitor.

## 2018-11-27 NOTE — ED Notes (Signed)
Report to [employer] Corporation[employer] Melinda.

## 2018-11-27 NOTE — BH Assessment (Signed)
[facility]   Per Dr. Vanetta ShawlHisada and Nanine MeansJamison Lord, DNP, patient continues to need gero-psych placement.

## 2018-11-27 NOTE — Progress Notes (Signed)
[facility] and patient continues to remain on their wait list.   CSW will continue to follow as needed.   Stacy GardnerErin Arlander Gillen, LCSW Clinical Social Worker  [facility]  [phone]

## 2018-11-28 DIAGNOSIS — F23 Brief psychotic disorder: Secondary | ICD-10-CM | POA: Diagnosis not present

## 2018-11-28 NOTE — BHH Counselor (Signed)
Clinician received [facility] if the pt still needs placement so she can present her.   Redmond Pullingreylese D Rashidah [place], MS, [facility], [facility] Triage Specialist [phone]

## 2018-11-28 NOTE — ED Notes (Signed)
Writer woke her for her pm meds, woke quickly to her name being called once. She never spoke to me but made herself understood with her non verbals. She is irritated to angry, took awhile to take her meds but she did. She initially shook her head no when writer called out Haldol as one of the medications she took but she did take it too. She was offered food, tried several drinks on her tray next to her bed but shook her head no to all of them. Did drink some of the water presented to her to take her meds.

## 2018-11-28 NOTE — Consult Note (Addendum)
[facility]  11/28/2018 5:46 PM Stephanie AmabileCharlotte Snyder  MRN:  [id number] Subjective:   62 yo female appears to be better, calmer and staying in her bed. She has been sleeping frequently but awakened to talk to her boyfriend/husband.  Sitter reports she has allowed care without issues, no agitation or combative behaviors.  Reassess for disposition in am.  Principal Problem: Acute psychosis (HCC) Diagnosis:  Principal Problem:   Acute psychosis (HCC)  Total Time spent with patient: 15 minutes  Past Psychiatric History: Denies   Past Medical History:  Past Medical History:  Diagnosis Date  . Arthritis   . Asthma   . CVA (cerebral vascular accident) (HCC) 2008  . Hypertension   . Irregular heart beat 2015    Past Surgical History:  Procedure Laterality Date  . TUBAL LIGATION  1984   Family History:  Family History  Problem Relation Age of Onset  . Cancer Father    Family Psychiatric  History: Mother-dementia  Social History:  Social History   Substance and Sexual Activity  Alcohol Use No     Social History   Substance and Sexual Activity  Drug Use No    Social History   Socioeconomic History  . Marital status: Married    Spouse name: Not on file  . Number of children: Not on file  . Years of education: Not on file  . Highest education level: Not on file  Occupational History  . Not on file  Social Needs  . Financial resource strain: Not on file  . Food insecurity:    Worry: Not on file    Inability: Not on file  . Transportation needs:    Medical: Not on file    Non-medical: Not on file  Tobacco Use  . Smoking status: Never Smoker  . Smokeless tobacco: Never Used  Substance and Sexual Activity  . Alcohol use: No  . Drug use: No  . Sexual activity: Not on file  Lifestyle  . Physical activity:    Days per week: Not on file    Minutes per session: Not on file  . Stress: Not on file  Relationships  . Social connections:    Talks on phone:  Not on file    Gets together: Not on file    Attends religious service: Not on file    Active member of club or organization: Not on file    Attends meetings of clubs or organizations: Not on file    Relationship status: Not on file  Other Topics Concern  . Not on file  Social History Narrative  . Not on file    Sleep: Fair  Appetite:  Fair  Current Medications: Current Facility-Administered Medications  Medication Dose Route Frequency Provider Last Rate Last Dose  . acetaminophen (TYLENOL) tablet 650 mg  650 mg Oral Q4H PRN Elpidio AnisUpstill, Shari, PA-C   650 mg at 11/23/18 2322  . albuterol (PROVENTIL HFA;VENTOLIN HFA) 108 (90 Base) MCG/ACT inhaler 2 puff  2 puff Inhalation Q6H PRN Curatolo, Adam, DO      . amLODipine (NORVASC) tablet 10 mg  10 mg Oral Daily Curatolo, Adam, DO   10 mg at 11/28/18 0954  . benztropine (COGENTIN) tablet 0.5 mg  0.5 mg Oral BID Charm RingsLord, Jamison Y, NP   0.5 mg at 11/28/18 0954  . divalproex (DEPAKOTE) DR tablet 250 mg  250 mg Oral Q12H Charm RingsLord, Jamison Y, NP   250 mg at 11/28/18 0956  . haloperidol (HALDOL)  tablet 5 mg  5 mg Oral BID Charm RingsLord, Jamison Y, NP   5 mg at 11/28/18 0957  . haloperidol lactate (HALDOL) injection 5 mg  5 mg Intramuscular Q6H PRN Charm RingsLord, Jamison Y, NP   5 mg at 11/26/18 2257  . lisinopril (PRINIVIL,ZESTRIL) tablet 20 mg  20 mg Oral Daily Curatolo, Adam, DO   20 mg at 11/28/18 [id number]   And  . hydrochlorothiazide (HYDRODIURIL) tablet 25 mg  25 mg Oral Daily Curatolo, Adam, DO   25 mg at 11/28/18 [id number]   Current Outpatient Medications  Medication Sig Dispense Refill  . albuterol (PROVENTIL HFA;VENTOLIN HFA) 108 (90 Base) MCG/ACT inhaler Inhale 2 puffs into the lungs every 6 (six) hours as needed for wheezing or shortness of breath. 18 g 3  . amLODipine (NORVASC) 10 MG tablet Take 1 tablet (10 mg total) by mouth daily. 90 tablet 2  . diclofenac sodium (VOLTAREN) 1 % GEL Apply 2 g topically 4 (four) times daily. As needed 1 Tube 2  .  lisinopril-hydrochlorothiazide (PRINZIDE,ZESTORETIC) 20-25 MG tablet Take 1 tablet by mouth daily. 90 tablet 2  . meloxicam (MOBIC) 7.5 MG tablet Take 1 tablet (7.5 mg total) by mouth daily as needed for pain. 30 tablet 5  . cetirizine (ZYRTEC) 5 MG tablet Take 1 tablet (5 mg total) by mouth daily. (Patient not taking: Reported on 11/21/2018) 30 tablet 3    Lab Results:  No results found for this or any previous visit (from the past 48 hour(s)).  Blood Alcohol level:  Lab Results  Component Value Date   ETH <10 11/21/2018    Musculoskeletal: Strength & Muscle Tone: within normal limits Gait & Station: normal Patient leans: N/A  Psychiatric Specialty Exam: Physical Exam  Nursing note and vitals reviewed. Constitutional: She appears well-developed and well-nourished.  HENT:  Head: Normocephalic and atraumatic.  Neck: Normal range of motion.  Respiratory: Effort normal.  Musculoskeletal: Normal range of motion.  Neurological: She is alert.  Psychiatric: Her speech is normal and behavior is normal. Her affect is labile. Thought content is paranoid. Cognition and memory are impaired. She expresses impulsivity.    Review of Systems  Psychiatric/Behavioral: Positive for memory loss. Negative for substance abuse.  All other systems reviewed and are negative.   Blood pressure (!) 84/52, pulse 89, temperature 98.2 F (36.8 C), temperature source Oral, resp. rate 16, SpO2 98 %.There is no height or weight on file to calculate BMI.  General Appearance: Fairly Groomed and Guarded, middle aged, African American female, wearing paper hospital scrubs with short hair who is sitting on the floor in her room. NAD.  Eye Contact:  Fair  Speech:  Clear and Coherent and Normal Rate  Volume:  Normal  Mood:  Dysphoric  Affect:  Labile  Thought Process:  Disorganized and Descriptions of Associations: Tangential  Orientation:  Full (Time, Place, and Person)  Thought Content:  Delusions and Paranoid  Ideation  Suicidal Thoughts:  No  Homicidal Thoughts:  No  Memory:  Immediate;   Fair Recent;   Fair Remote;   Fair  Judgement:  Impaired  Insight:  Lacking  Psychomotor Activity:  Increased  Concentration:  Concentration: Fair and Attention Span: Fair  Recall:  [employer]Fair  Fund of Knowledge:  Fair  Language:  Good  Akathisia:  No  Handed:  Right  AIMS (if indicated):   N/A  Assets:  Financial Resources/Insurance Housing Intimacy Social Support  ADL's:  Intact  Cognition: Impaired due to psychiatric condition.  Sleep:  N/A   Assessment:  Stephanie Snyder is a 62 y.o. female who was admitted with psychosis in the setting of suspected dementia She continues to exhibit paranoia and delusional ideas. She has been agitated and requiring behavioral medications. She warrants inpatient psychiatric hospitalization for stabilization and treatment.    Treatment Plan Summary: Acute Psychosis: -Continue Depakote 250 mg BID for mood stabilization -continue Haldol 5 mg BID -Continue Haldol 5 mg IM every six hours PRN agitation  EPS: -Continue Cogentin 0.5 mg BID  Disposition: Recommend psychiatric inpatient admission unless remains calm and cooperative  Nanine Means, NP 11/28/2018, 5:46 PM    Patient seen face-to-face for psychiatric evaluation, chart reviewed and case discussed with the physician extender and developed treatment plan. Reviewed the information documented and agree with the treatment plan. Patient seen, chart reviewed and staffed. Agree with assessment and plan
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
## 2018-11-29 ENCOUNTER — Inpatient Hospital Stay (HOSPITAL_COMMUNITY)
Admit: 2018-11-29 | Discharge: 2018-11-29 | Disposition: A | Discharge status: Home / Self Care | Payer: Medicare Other | Attending: Acute Care | Admitting: Acute Care

## 2018-11-29 ENCOUNTER — Emergency Department (HOSPITAL_COMMUNITY): Payer: Medicare Other

## 2018-11-29 ENCOUNTER — Encounter (HOSPITAL_COMMUNITY): Payer: Self-pay | Admitting: Radiology

## 2018-11-29 ENCOUNTER — Inpatient Hospital Stay (HOSPITAL_COMMUNITY): Payer: Medicare Other

## 2018-11-29 ENCOUNTER — Other Ambulatory Visit (HOSPITAL_COMMUNITY): Payer: Self-pay

## 2018-11-29 DIAGNOSIS — N390 Urinary tract infection, site not specified: Secondary | ICD-10-CM | POA: Diagnosis present

## 2018-11-29 DIAGNOSIS — I824Z3 Acute embolism and thrombosis of unspecified deep veins of distal lower extremity, bilateral: Secondary | ICD-10-CM | POA: Diagnosis not present

## 2018-11-29 DIAGNOSIS — N179 Acute kidney failure, unspecified: Secondary | ICD-10-CM | POA: Diagnosis present

## 2018-11-29 DIAGNOSIS — F0390 Unspecified dementia without behavioral disturbance: Secondary | ICD-10-CM | POA: Diagnosis present

## 2018-11-29 DIAGNOSIS — M7989 Other specified soft tissue disorders: Secondary | ICD-10-CM | POA: Diagnosis not present

## 2018-11-29 DIAGNOSIS — I82453 Acute embolism and thrombosis of peroneal vein, bilateral: Secondary | ICD-10-CM | POA: Diagnosis present

## 2018-11-29 DIAGNOSIS — R45851 Suicidal ideations: Secondary | ICD-10-CM | POA: Diagnosis present

## 2018-11-29 DIAGNOSIS — I2602 Saddle embolus of pulmonary artery with acute cor pulmonale: Secondary | ICD-10-CM | POA: Diagnosis not present

## 2018-11-29 DIAGNOSIS — S0990XA Unspecified injury of head, initial encounter: Secondary | ICD-10-CM | POA: Diagnosis not present

## 2018-11-29 DIAGNOSIS — J9601 Acute respiratory failure with hypoxia: Secondary | ICD-10-CM | POA: Diagnosis present

## 2018-11-29 DIAGNOSIS — R451 Restlessness and agitation: Secondary | ICD-10-CM | POA: Diagnosis not present

## 2018-11-29 DIAGNOSIS — Z8673 Personal history of transient ischemic attack (TIA), and cerebral infarction without residual deficits: Secondary | ICD-10-CM | POA: Diagnosis not present

## 2018-11-29 DIAGNOSIS — R571 Hypovolemic shock: Secondary | ICD-10-CM | POA: Diagnosis present

## 2018-11-29 DIAGNOSIS — G9340 Encephalopathy, unspecified: Secondary | ICD-10-CM | POA: Diagnosis not present

## 2018-11-29 DIAGNOSIS — S199XXA Unspecified injury of neck, initial encounter: Secondary | ICD-10-CM | POA: Diagnosis not present

## 2018-11-29 DIAGNOSIS — M1990 Unspecified osteoarthritis, unspecified site: Secondary | ICD-10-CM | POA: Diagnosis present

## 2018-11-29 DIAGNOSIS — N170 Acute kidney failure with tubular necrosis: Secondary | ICD-10-CM

## 2018-11-29 DIAGNOSIS — R55 Syncope and collapse: Secondary | ICD-10-CM | POA: Diagnosis not present

## 2018-11-29 DIAGNOSIS — G9341 Metabolic encephalopathy: Secondary | ICD-10-CM

## 2018-11-29 DIAGNOSIS — G92 Toxic encephalopathy: Secondary | ICD-10-CM | POA: Diagnosis present

## 2018-11-29 DIAGNOSIS — I2609 Other pulmonary embolism with acute cor pulmonale: Secondary | ICD-10-CM | POA: Diagnosis not present

## 2018-11-29 DIAGNOSIS — F05 Delirium due to known physiological condition: Secondary | ICD-10-CM | POA: Diagnosis present

## 2018-11-29 DIAGNOSIS — I639 Cerebral infarction, unspecified: Secondary | ICD-10-CM | POA: Diagnosis not present

## 2018-11-29 DIAGNOSIS — I82443 Acute embolism and thrombosis of tibial vein, bilateral: Secondary | ICD-10-CM | POA: Diagnosis present

## 2018-11-29 DIAGNOSIS — J45909 Unspecified asthma, uncomplicated: Secondary | ICD-10-CM | POA: Diagnosis present

## 2018-11-29 DIAGNOSIS — I6521 Occlusion and stenosis of right carotid artery: Secondary | ICD-10-CM | POA: Diagnosis present

## 2018-11-29 DIAGNOSIS — Z791 Long term (current) use of non-steroidal anti-inflammatories (NSAID): Secondary | ICD-10-CM | POA: Diagnosis not present

## 2018-11-29 DIAGNOSIS — E872 Acidosis: Secondary | ICD-10-CM | POA: Diagnosis present

## 2018-11-29 DIAGNOSIS — E876 Hypokalemia: Secondary | ICD-10-CM | POA: Diagnosis present

## 2018-11-29 DIAGNOSIS — I63311 Cerebral infarction due to thrombosis of right middle cerebral artery: Secondary | ICD-10-CM | POA: Diagnosis present

## 2018-11-29 DIAGNOSIS — F23 Brief psychotic disorder: Secondary | ICD-10-CM | POA: Diagnosis present

## 2018-11-29 DIAGNOSIS — R579 Shock, unspecified: Secondary | ICD-10-CM

## 2018-11-29 DIAGNOSIS — X58XXXA Exposure to other specified factors, initial encounter: Secondary | ICD-10-CM | POA: Diagnosis not present

## 2018-11-29 DIAGNOSIS — I2699 Other pulmonary embolism without acute cor pulmonale: Secondary | ICD-10-CM | POA: Diagnosis present

## 2018-11-29 DIAGNOSIS — Z79899 Other long term (current) drug therapy: Secondary | ICD-10-CM | POA: Diagnosis not present

## 2018-11-29 DIAGNOSIS — F29 Unspecified psychosis not due to a substance or known physiological condition: Secondary | ICD-10-CM | POA: Diagnosis not present

## 2018-11-29 DIAGNOSIS — W19XXXA Unspecified fall, initial encounter: Secondary | ICD-10-CM | POA: Diagnosis not present

## 2018-11-29 DIAGNOSIS — I361 Nonrheumatic tricuspid (valve) insufficiency: Secondary | ICD-10-CM | POA: Diagnosis not present

## 2018-11-29 DIAGNOSIS — R4182 Altered mental status, unspecified: Secondary | ICD-10-CM | POA: Diagnosis not present

## 2018-11-29 DIAGNOSIS — Z23 Encounter for immunization: Secondary | ICD-10-CM | POA: Diagnosis not present

## 2018-11-29 DIAGNOSIS — R7989 Other specified abnormal findings of blood chemistry: Secondary | ICD-10-CM | POA: Diagnosis not present

## 2018-11-29 DIAGNOSIS — J9600 Acute respiratory failure, unspecified whether with hypoxia or hypercapnia: Secondary | ICD-10-CM | POA: Diagnosis not present

## 2018-11-29 DIAGNOSIS — I959 Hypotension, unspecified: Secondary | ICD-10-CM | POA: Diagnosis not present

## 2018-11-29 DIAGNOSIS — E785 Hyperlipidemia, unspecified: Secondary | ICD-10-CM | POA: Diagnosis present

## 2018-11-29 DIAGNOSIS — T50915A Adverse effect of multiple unspecified drugs, medicaments and biological substances, initial encounter: Secondary | ICD-10-CM | POA: Diagnosis present

## 2018-11-29 LAB — URINALYSIS, ROUTINE W REFLEX MICROSCOPIC
Glucose, UA: NEGATIVE mg/dL
Ketones, ur: 5 mg/dL — AB
NITRITE: NEGATIVE
Protein, ur: 100 mg/dL — AB
RBC / HPF: >50 RBC/hpf — ABNORMAL HIGH (ref 0–5)
Specific Gravity, Urine: 1.023 (ref 1.005–1.030)
pH: 5 (ref 5.0–8.0)

## 2018-11-29 LAB — BLOOD GAS, ARTERIAL
Acid-base deficit: 7.6 mmol/L — ABNORMAL HIGH (ref 0.0–2.0)
Bicarbonate: 16.5 mmol/L — ABNORMAL LOW (ref 20.0–28.0)
DRAWN BY: 308601
O2 Content: 4 L/min
O2 Saturation: 98.4 %
Patient temperature: 98.6
pCO2 arterial: 30.5 mmHg — ABNORMAL LOW (ref 32.0–48.0)
pH, Arterial: 7.354 (ref 7.350–7.450)
pO2, Arterial: 153 mmHg — ABNORMAL HIGH (ref 83.0–108.0)

## 2018-11-29 LAB — COMPREHENSIVE METABOLIC PANEL
ALT: 14 U/L (ref 0–44)
AST: 25 U/L (ref 15–41)
Albumin: 3.5 g/dL (ref 3.5–5.0)
Alkaline Phosphatase: 51 U/L (ref 38–126)
Anion gap: 16 — ABNORMAL HIGH (ref 5–15)
BUN: 63 mg/dL — ABNORMAL HIGH (ref 8–23)
CO2: 23 mmol/L (ref 22–32)
CREATININE: 7.91 mg/dL — AB (ref 0.44–1.00)
Calcium: 8.6 mg/dL — ABNORMAL LOW (ref 8.9–10.3)
Chloride: 102 mmol/L (ref 98–111)
GFR calc non Af Amer: 5 mL/min — ABNORMAL LOW (ref >60)
GFR, EST AFRICAN AMERICAN: 6 mL/min — AB (ref >60)
Glucose, Bld: 136 mg/dL — ABNORMAL HIGH (ref 70–99)
Potassium: 3.7 mmol/L (ref 3.5–5.1)
Sodium: 141 mmol/L (ref 135–145)
Total Bilirubin: 1.1 mg/dL (ref 0.3–1.2)
Total Protein: 6.9 g/dL (ref 6.5–8.1)

## 2018-11-29 LAB — BASIC METABOLIC PANEL
Anion gap: 16 — ABNORMAL HIGH (ref 5–15)
BUN: 57 mg/dL — AB (ref 8–23)
CO2: 19 mmol/L — ABNORMAL LOW (ref 22–32)
Calcium: 7.8 mg/dL — ABNORMAL LOW (ref 8.9–10.3)
Chloride: 105 mmol/L (ref 98–111)
Creatinine, Ser: 6.77 mg/dL — ABNORMAL HIGH (ref 0.44–1.00)
GFR calc Af Amer: 7 mL/min — ABNORMAL LOW (ref >60)
GFR calc non Af Amer: 6 mL/min — ABNORMAL LOW (ref >60)
Glucose, Bld: 238 mg/dL — ABNORMAL HIGH (ref 70–99)
Potassium: 4.3 mmol/L (ref 3.5–5.1)
Sodium: 140 mmol/L (ref 135–145)

## 2018-11-29 LAB — CBC WITH DIFFERENTIAL/PLATELET
ABS IMMATURE GRANULOCYTES: 0.03 10*3/uL (ref 0.00–0.07)
Basophils Absolute: 0 10*3/uL (ref 0.0–0.1)
Basophils Relative: 1 %
Eosinophils Absolute: 0.1 10*3/uL (ref 0.0–0.5)
Eosinophils Relative: 2 %
HCT: 40.4 % (ref 36.0–46.0)
Hemoglobin: 12.8 g/dL (ref 12.0–15.0)
Immature Granulocytes: 0 %
Lymphocytes Relative: 19 %
Lymphs Abs: 1.6 10*3/uL (ref 0.7–4.0)
MCH: 26.8 pg (ref 26.0–34.0)
MCHC: 31.7 g/dL (ref 30.0–36.0)
MCV: 84.5 fL (ref 80.0–100.0)
Monocytes Absolute: 0.7 10*3/uL (ref 0.1–1.0)
Monocytes Relative: 9 %
NEUTROS ABS: 5.8 10*3/uL (ref 1.7–7.7)
NEUTROS PCT: 69 %
NRBC: 0 % (ref 0.0–0.2)
Platelets: 175 10*3/uL (ref 150–400)
RBC: 4.78 MIL/uL (ref 3.87–5.11)
RDW: 13.3 % (ref 11.5–15.5)
WBC: 8.3 10*3/uL (ref 4.0–10.5)

## 2018-11-29 LAB — CBG MONITORING, ED: Glucose-Capillary: 94 mg/dL (ref 70–99)

## 2018-11-29 LAB — RAPID URINE DRUG SCREEN, HOSP PERFORMED
Amphetamines: NOT DETECTED
Barbiturates: NOT DETECTED
Benzodiazepines: NOT DETECTED
Cocaine: NOT DETECTED
Opiates: NOT DETECTED
Tetrahydrocannabinol: NOT DETECTED

## 2018-11-29 LAB — NA AND K (SODIUM & POTASSIUM), RAND UR
POTASSIUM UR: 77 mmol/L
Sodium, Ur: <10 mmol/L

## 2018-11-29 LAB — CREATININE, URINE, RANDOM: Creatinine, Urine: 671.94 mg/dL

## 2018-11-29 LAB — PROTEIN, URINE, RANDOM: Total Protein, Urine: 131 mg/dL

## 2018-11-29 LAB — MRSA PCR SCREENING: MRSA by PCR: NEGATIVE

## 2018-11-29 LAB — LACTIC ACID, PLASMA
LACTIC ACID, VENOUS: 2.9 mmol/L — AB (ref 0.5–1.9)
Lactic Acid, Venous: 4.3 mmol/L (ref 0.5–1.9)

## 2018-11-29 LAB — D-DIMER, QUANTITATIVE: D-Dimer, Quant: >20 ug/mL-FEU — ABNORMAL HIGH (ref 0.00–0.50)

## 2018-11-29 LAB — PROTIME-INR
INR: 1.35
Prothrombin Time: 16.5 seconds — ABNORMAL HIGH (ref 11.4–15.2)

## 2018-11-29 LAB — MAGNESIUM
Magnesium: 2.4 mg/dL (ref 1.7–2.4)
Magnesium: 2 mg/dL (ref 1.7–2.4)

## 2018-11-29 LAB — VALPROIC ACID LEVEL
VALPROIC ACID LVL: 37 ug/mL — AB (ref 50.0–100.0)
Valproic Acid Lvl: 22 ug/mL — ABNORMAL LOW (ref 50.0–100.0)

## 2018-11-29 LAB — I-STAT TROPONIN, ED: Troponin i, poc: 0.09 ng/mL (ref 0.00–0.08)

## 2018-11-29 LAB — AMMONIA: Ammonia: 16 umol/L (ref 9–35)

## 2018-11-29 LAB — APTT: aPTT: 169 seconds (ref 24–36)

## 2018-11-29 LAB — LIPASE, BLOOD: Lipase: 35 U/L (ref 11–51)

## 2018-11-29 MED ORDER — HALOPERIDOL 5 MG PO TABS: 5.0000 mg | ORAL_TABLET | Freq: Two times a day (BID) | ORAL | 0 refills | Status: DC

## 2018-11-29 MED ORDER — NOREPINEPHRINE 4 MG/250ML-% IV SOLN
0.0000 ug/min | INTRAVENOUS | Status: DC
  Administered 2018-11-29: 2 ug/min via INTRAVENOUS
  Administered 2018-11-29: 30 ug/min via INTRAVENOUS
  Administered 2018-11-29 (×2): 31 ug/min via INTRAVENOUS
  Administered 2018-11-30: 30 ug/min via INTRAVENOUS
  Administered 2018-11-30: 22 ug/min via INTRAVENOUS
  Administered 2018-11-30: 31 ug/min via INTRAVENOUS
  Administered 2018-11-30: 30 ug/min via INTRAVENOUS
  Filled 2018-11-29 (×7): qty 250

## 2018-11-29 MED ORDER — HEPARIN (PORCINE) 25000 UT/250ML-% IV SOLN
1000.0000 [IU]/h | INTRAVENOUS | Status: DC
  Administered 2018-11-29: 1200 [IU]/h via INTRAVENOUS
  Administered 2018-11-30: 1000 [IU]/h via INTRAVENOUS
  Filled 2018-11-29 (×2): qty 250

## 2018-11-29 MED ORDER — SODIUM CHLORIDE 0.9 % IV SOLN
INTRAVENOUS | Status: DC
  Administered 2018-11-29 – 2018-11-30 (×2): via INTRAVENOUS

## 2018-11-29 MED ORDER — CHLORHEXIDINE GLUCONATE CLOTH 2 % EX PADS
6.0000 | MEDICATED_PAD | Freq: Every day | CUTANEOUS | Status: DC
  Administered 2018-11-29 – 2018-12-01 (×2): 6 via TOPICAL

## 2018-11-29 MED ORDER — VASOPRESSIN 20 UNIT/ML IV SOLN
0.0300 [IU]/min | INTRAVENOUS | Status: DC
  Administered 2018-11-29 – 2018-11-30 (×3): 0.04 [IU]/min via INTRAVENOUS
  Filled 2018-11-29 (×3): qty 2

## 2018-11-29 MED ORDER — SODIUM CHLORIDE 0.9 % IV BOLUS
1000.0000 mL | Freq: Once | INTRAVENOUS | Status: AC
  Administered 2018-11-29: 1000 mL via INTRAVENOUS

## 2018-11-29 MED ORDER — ONDANSETRON HCL 4 MG/2ML IJ SOLN
4.0000 mg | Freq: Four times a day (QID) | INTRAMUSCULAR | Status: DC | PRN
  Administered 2018-11-29: 4 mg via INTRAVENOUS
  Filled 2018-11-29: qty 2

## 2018-11-29 MED ORDER — SODIUM CHLORIDE 0.9% FLUSH
10.0000 mL | Freq: Two times a day (BID) | INTRAVENOUS | Status: DC
  Administered 2018-11-30 – 2018-12-01 (×4): 10 mL

## 2018-11-29 MED ORDER — LACTATED RINGERS IV BOLUS
1000.0000 mL | Freq: Once | INTRAVENOUS | Status: AC
  Administered 2018-11-29: 1000 mL via INTRAVENOUS

## 2018-11-29 MED ORDER — BENZTROPINE MESYLATE 0.5 MG PO TABS: 0.5000 mg | ORAL_TABLET | Freq: Two times a day (BID) | ORAL | 0 refills | Status: DC

## 2018-11-29 MED ORDER — ASPIRIN 81 MG PO CHEW: 324.0000 mg | CHEWABLE_TABLET | Freq: Once | ORAL | Status: DC

## 2018-11-29 MED ORDER — ORAL CARE MOUTH RINSE
15.0000 mL | Freq: Two times a day (BID) | OROMUCOSAL | Status: DC
  Administered 2018-11-29 – 2018-12-01 (×5): 15 mL via OROMUCOSAL

## 2018-11-29 MED ORDER — HEPARIN BOLUS VIA INFUSION
3000.0000 [IU] | Freq: Once | INTRAVENOUS | Status: AC
  Administered 2018-11-29: 3000 [IU] via INTRAVENOUS
  Filled 2018-11-29: qty 3000

## 2018-11-29 MED ORDER — SODIUM CHLORIDE 0.9 % IV SOLN
INTRAVENOUS | Status: DC | PRN
  Administered 2018-11-29: 500 mL via INTRAVENOUS
  Administered 2018-11-30: 1000 mL via INTRAVENOUS
  Administered 2018-12-03 – 2018-12-04 (×2): 250 mL via INTRAVENOUS

## 2018-11-29 MED ORDER — VITAMINS A & D EX OINT
TOPICAL_OINTMENT | CUTANEOUS | Status: AC
  Administered 2018-11-29: 1
  Filled 2018-11-29: qty 5

## 2018-11-29 MED ORDER — DIVALPROEX SODIUM 250 MG PO DR TAB: 250.0000 mg | DELAYED_RELEASE_TABLET | Freq: Two times a day (BID) | ORAL | 0 refills | Status: DC

## 2018-11-29 MED ORDER — SODIUM CHLORIDE 0.9% FLUSH
10.0000 mL | INTRAVENOUS | Status: DC | PRN
  Administered 2018-12-01: 10 mL
  Filled 2018-11-29: qty 40

## 2018-11-29 MED ORDER — SODIUM CHLORIDE 0.9 % IV SOLN
1.0000 g | INTRAVENOUS | Status: DC
  Administered 2018-11-29 – 2018-12-01 (×3): 1 g via INTRAVENOUS
  Filled 2018-11-29 (×4): qty 1

## 2018-11-29 NOTE — ED Notes (Signed)
Psychiatry informed about events, reports that note will be placed in chart and will continue to follow.

## 2018-11-29 NOTE — ED Notes (Signed)
Patient resting at this time.

## 2018-11-29 NOTE — Progress Notes (Signed)
EEG completed, results pending. 

## 2018-11-29 NOTE — ED Notes (Addendum)
Provider aware of pts multiple low BP readings

## 2018-11-29 NOTE — Progress Notes (Signed)
ANTICOAGULATION CONSULT NOTE   Pharmacy Consult for Heparin  Indication: pulmonary embolus  Allergies  Allergen Reactions  . Latex Hives    Patient Measurements:   Heparin Dosing Weight: last recorded weight September 2019 was 152lb, 70kg  Vital Signs: Temp: 98.6 F (37 C) (12/31 1454) Temp Source: Rectal (12/31 1454) BP: 95/68 (12/31 1550) Pulse Rate: 85 (12/31 1530)  Labs: Recent Labs    11/29/18 1133 11/29/18 1416  HGB 12.8  --   HCT 40.4  --   PLT 175  --   CREATININE  --  7.91*   CrCl cannot be calculated (Unknown ideal weight.).  Medical History: Past Medical History:  Diagnosis Date  . Arthritis   . Asthma   . CVA (cerebral vascular accident) (HCC) 2008  . Hypertension   . Irregular heart beat 2015   Medications:  Scheduled:   Infusions:  . sodium chloride    . heparin 1,200 Units/hr (11/29/18 1625)  . norepinephrine (LEVOPHED) Adult infusion 25 mcg/min (11/29/18 1632)  . vasopressin (PITRESSIN) infusion - *FOR SHOCK*      Assessment: 62 yoF in TCU x 8 days, getting dressed for d/c 12/31 >> syncopal event >> floor, hypotensive. Head/Neck CT: neg for trauma, bleed. Begin Heparin for poss PE PMH: no seizure hx, was on Depakote/Haldol for psych issues  Today, 11/29/2018 SCr on admit 1.02 >> 7.91  Goal of Therapy:  Heparin level 0.3-0.7 units/ml Monitor platelets by anticoagulation protocol: Yes   Plan:   Heparin bolus 3000 units, infusion at 1200 units/hr  Check first Heparin level in 8 hr with poor renal function  Daily cbc, plan daily Heparin level at steady state  Otho BellowsGreen, Cheyan Frees L PharmD Pager [phone] 11/29/2018, 4:52 PM

## 2018-11-29 NOTE — ED Notes (Signed)
Patient up at sink, curtain closed. Heard a thud sound. Entered into room, found patient on floor gasping for air. Patient lost control of bladder. Called for help to main ED, Staff and Dr. Clayborne DanaMesner at bedside. C-collar applied, patient on backboard, wheeled out to Main ED.

## 2018-11-29 NOTE — Progress Notes (Addendum)
Writer notified by staff nurse Marcelo Baldyaquita Milner, RN that patient was being prepared for discharged when her medical condition began to deteriorate. Patient had a witnessed seizure, hypotension, bradycardia, and ongoing altered mental status. She has been transferred from TCU to recess. Patient has been psychiatric cleared at this time. During her 8 day LOS she was started on Haldol 5mg  po BID and Depakote 250mg  po BID. EKG obtained during admission was normal (QTC 467), with the exception of possible left ventricular hypertrophy.  Labs obtained this morning prior to original discharge were unremarkable. CBC within normal limit and Depakote level is 37 ( non toxic level), repeat UA obtained after asymptomatic bacteria was improved some evidence of ketones and protein. Previous CT scan was normal obtained on 11/21/2018 due to new acute onset of psychosis. Please see order management for new orders received by EDP. Patient may benefit from repeat CMP to rule out any new additional electrolyte abnormalities, D-Dimer to screen for pulmonary embolism. WIll need to rule out any other additional acute processes.   Patient to be under medical evaluation for now for assessment and can be followed up with psych consult when or if needed.

## 2018-11-29 NOTE — ED Notes (Signed)
US at bedside

## 2018-11-29 NOTE — Progress Notes (Signed)
PHARMACY NOTE:  ANTIMICROBIAL RENAL DOSAGE ADJUSTMENT  Current antimicrobial regimen includes a mismatch between antimicrobial dosage and estimated renal function.  As per policy approved by the Pharmacy & Therapeutics and Medical Executive Committees, the antimicrobial dosage will be adjusted accordingly.  Current antimicrobial dosage:  Cefepime 2gm q12 ordered  Indication: Sepsis  Renal Function: Clearance ~ 8 ml/min with SCr 7.91 gm/dl  CrCl cannot be calculated (Unknown ideal weight.). []      On intermittent HD, scheduled: []      On CRRT    Antimicrobial dosage has been changed to:  Cefepime 1gm q24  Additional comments: Will adjust Cefepime dose/schedule as renal function improves.   Thank you for allowing pharmacy to be a part of this patient's care.  Otho BellowsGreen, Seward Coran L, [facility] 11/29/2018 6:24 PM

## 2018-11-29 NOTE — ED Notes (Signed)
ED TO INPATIENT HANDOFF REPORT  Name/Age/Gender Stephanie Snyder 62 y.o. female  Code Status    Code Status Orders  (From admission, onward)         Start     Ordered   11/29/18 1539  Full code  Continuous     11/29/18 1542        Code Status History    This patient has a current code status but no historical code status.      Home/SNF/Other Home  Chief Complaint Agitation  Level of Care/Admitting Diagnosis ED Disposition    ED Disposition Condition Comment   Admit  [facility] [[id number]]  Level of Care: ICU [6]  Diagnosis: Shock circulatory [facility](HCC) [[id number]][[id number]]  Admitting Physician: Josephine IgoICARD, BRADLEY L [[id number]]  Attending Physician: Josephine IgoICARD, BRADLEY L [[id number]]  Estimated length of stay: past midnight tomorrow  Certification:: I certify this patient will need inpatient services for at least 2 midnights  PT Class (Do Not Modify): Inpatient [101]  PT Acc Code (Do Not Modify): Private [1]       Medical History Past Medical History:  Diagnosis Date  . Arthritis   . Asthma   . CVA (cerebral vascular accident) (HCC) 2008  . Hypertension   . Irregular heart beat 2015    Allergies Allergies  Allergen Reactions  . Latex Hives    IV Location/Drains/Wounds Patient Lines/Drains/Airways Status   Active Line/Drains/Airways    Name:   Placement date:   Placement time:   Site:   Days:   Peripheral IV 11/29/18 Left Forearm   11/29/18    1357    Forearm   less than 1   CVC Triple Lumen 11/29/18 Right Femoral 20 cm   11/29/18    1555     less than 1          Labs/Imaging Results for orders placed or performed during the hospital encounter of 11/21/18 (from the past 48 hour(s))  CBC with Differential/Platelet     Status: None   Collection Time: 11/29/18 11:33 AM  Result Value Ref Range   WBC 8.3 4.0 - 10.5 K/uL   RBC 4.78 3.87 - 5.11 MIL/uL   Hemoglobin 12.8 12.0 - 15.0 g/dL   HCT 65.740.4 84.636.0 - 96.246.0 %   MCV 84.5 80.0 - 100.0 fL    MCH 26.8 26.0 - 34.0 pg   MCHC 31.7 30.0 - 36.0 g/dL   RDW 95.213.3 84.111.5 - 32.415.5 %   Platelets 175 150 - 400 K/uL   nRBC 0.0 0.0 - 0.2 %   Neutrophils Relative % 69 %   Neutro Abs 5.8 1.7 - 7.7 K/uL   Lymphocytes Relative 19 %   Lymphs Abs 1.6 0.7 - 4.0 K/uL   Monocytes Relative 9 %   Monocytes Absolute 0.7 0.1 - 1.0 K/uL   Eosinophils Relative 2 %   Eosinophils Absolute 0.1 0.0 - 0.5 K/uL   Basophils Relative 1 %   Basophils Absolute 0.0 0.0 - 0.1 K/uL   Immature Granulocytes 0 %   Abs Immature Granulocytes 0.03 0.00 - 0.07 K/uL    Comment: Performed at [facility], [address] [place], [place] [address]  Valproic acid level     Status: Abnormal   Collection Time: 11/29/18 11:33 AM  Result Value Ref Range   Valproic Acid Lvl 37 (L) 50.0 - 100.0 ug/mL    Comment: Performed at [facility], [address] [place], [place] [address]  CBG monitoring, ED     Status: None   Collection Time: 11/29/18  1:27 PM  Result Value Ref Range   Glucose-Capillary 94 70 - 99 mg/dL  Comprehensive metabolic panel     Status: Abnormal   Collection Time: 11/29/18  2:16 PM  Result Value Ref Range   Sodium 141 135 - 145 mmol/L   Potassium 3.7 3.5 - 5.1 mmol/L   Chloride 102 98 - 111 mmol/L   CO2 23 22 - 32 mmol/L   Glucose, Bld 136 (H) 70 - 99 mg/dL   BUN 63 (H) 8 - 23 mg/dL   Creatinine, Ser 1.30 (H) 0.44 - 1.00 mg/dL   Calcium 8.6 (L) 8.9 - 10.3 mg/dL   Total Protein 6.9 6.5 - 8.1 g/dL   Albumin 3.5 3.5 - 5.0 g/dL   AST 25 15 - 41 U/L   ALT 14 0 - 44 U/L   Alkaline Phosphatase 51 38 - 126 U/L   Total Bilirubin 1.1 0.3 - 1.2 mg/dL   GFR calc non Af Amer 5 (L) >60 mL/min   GFR calc Af Amer 6 (L) >60 mL/min   Anion gap 16 (H) 5 - 15    Comment: Performed at [facility], [address] [place], [place] [address]  Magnesium     Status: None   Collection Time: 11/29/18  2:16 PM  Result Value Ref Range   Magnesium 2.4 1.7 - 2.4 mg/dL     Comment: Performed at [facility], [address], [place], [place] [address]  I-stat troponin, ED     Status: Abnormal   Collection Time: 11/29/18  2:19 PM  Result Value Ref Range   Troponin i, poc 0.09 (HH) 0.00 - 0.08 ng/mL   Comment NOTIFIED PHYSICIAN    Comment 3            Comment: Due to the release kinetics of cTnI, a negative result within the first hours of the onset of symptoms does not rule out myocardial infarction with certainty. If myocardial infarction is still suspected, repeat the test at appropriate intervals.   Blood gas, arterial     Status: Abnormal   Collection Time: 11/29/18  3:50 PM  Result Value Ref Range   O2 Content 4.0 L/min   Delivery systems NASAL CANNULA    pH, Arterial 7.354 7.350 - 7.450   pCO2 arterial 30.5 (L) 32.0 - 48.0 mmHg   pO2, Arterial 153 (H) 83.0 - 108.0 mmHg   Bicarbonate 16.5 (L) 20.0 - 28.0 mmol/L   Acid-base deficit 7.6 (H) 0.0 - 2.0 mmol/L   O2 Saturation 98.4 %   Patient temperature 98.6    Collection site LEFT RADIAL    Drawn by [id number]    Sample type ARTERIAL DRAW    Allens test (pass/fail) PASS PASS    Comment: Performed at [facility], [address], [place], [place] [address]  Lactic acid, plasma     Status: Abnormal   Collection Time: 11/29/18  4:00 PM  Result Value Ref Range   Lactic Acid, Venous 2.9 (HH) 0.5 - 1.9 mmol/L    Comment: CRITICAL RESULT CALLED TO, READ BACK BY AND VERIFIED WITH: C.Jerrick Farve AT 1709 ON 11/29/18 BY N.Natthew Marlatt Performed at [facility], [address], [place], [place] [address]   Ammonia     Status: None   Collection Time: 11/29/18  4:04 PM  Result Value Ref Range   Ammonia 16 9 - 35  umol/L    Comment: Performed at [facility], [address], [place], [place] [address]   Ct Head Wo Contrast  Result Date: 11/29/2018 CLINICAL DATA:  62 year old female with history of fall and apparent loss of  consciousness. Syncopal event. EXAM: CT HEAD WITHOUT CONTRAST CT CERVICAL SPINE WITHOUT CONTRAST TECHNIQUE: Multidetector CT imaging of the head and cervical spine was performed following the standard protocol without intravenous contrast. Multiplanar CT image reconstructions of the cervical spine were also generated. COMPARISON:  None. FINDINGS: CT HEAD FINDINGS Brain: Patchy and confluent areas of decreased attenuation are noted throughout the deep and periventricular white matter of the cerebral hemispheres bilaterally, compatible with chronic microvascular ischemic disease. Mild physiologic calcifications in the basal ganglia bilaterally no evidence of acute infarction, hemorrhage, hydrocephalus, extra-axial collection or mass lesion/mass effect. Vascular: No hyperdense vessel or unexpected calcification. Skull: Normal. Negative for fracture or focal lesion. Sinuses/Orbits: No acute finding. Other: None. CT CERVICAL SPINE FINDINGS Alignment: Normal. Skull base and vertebrae: No acute fracture. No primary bone lesion or focal pathologic process. Soft tissues and spinal canal: No prevertebral fluid or swelling. No visible canal hematoma. Disc levels: Multilevel degenerative disc disease, most pronounced at C4-C5 and C5-C6. Mild multilevel facet arthropathy. Upper chest: Mild emphysematous changes. Other: Unremarkable. IMPRESSION: 1. No evidence of significant acute traumatic injury to the skull, brain or cervical spine. 2. Chronic microvascular ischemic changes in the cerebral white matter redemonstrated, as above. 3. Multilevel degenerative disc disease and cervical spondylosis, as above. Electronically Signed   By: Trudie Reed M.D.   On: 11/29/2018 14:02   Ct Cervical Spine Wo Contrast  Result Date: 11/29/2018 CLINICAL DATA:  62 year old female with history of fall and apparent loss of consciousness. Syncopal event. EXAM: CT HEAD WITHOUT CONTRAST CT CERVICAL SPINE WITHOUT CONTRAST TECHNIQUE:  Multidetector CT imaging of the head and cervical spine was performed following the standard protocol without intravenous contrast. Multiplanar CT image reconstructions of the cervical spine were also generated. COMPARISON:  None. FINDINGS: CT HEAD FINDINGS Brain: Patchy and confluent areas of decreased attenuation are noted throughout the deep and periventricular white matter of the cerebral hemispheres bilaterally, compatible with chronic microvascular ischemic disease. Mild physiologic calcifications in the basal ganglia bilaterally no evidence of acute infarction, hemorrhage, hydrocephalus, extra-axial collection or mass lesion/mass effect. Vascular: No hyperdense vessel or unexpected calcification. Skull: Normal. Negative for fracture or focal lesion. Sinuses/Orbits: No acute finding. Other: None. CT CERVICAL SPINE FINDINGS Alignment: Normal. Skull base and vertebrae: No acute fracture. No primary bone lesion or focal pathologic process. Soft tissues and spinal canal: No prevertebral fluid or swelling. No visible canal hematoma. Disc levels: Multilevel degenerative disc disease, most pronounced at C4-C5 and C5-C6. Mild multilevel facet arthropathy. Upper chest: Mild emphysematous changes. Other: Unremarkable. IMPRESSION: 1. No evidence of significant acute traumatic injury to the skull, brain or cervical spine. 2. Chronic microvascular ischemic changes in the cerebral white matter redemonstrated, as above. 3. Multilevel degenerative disc disease and cervical spondylosis, as above. Electronically Signed   By: Trudie Reed M.D.   On: 11/29/2018 14:02   US Renal  Result Date: 11/29/2018 CLINICAL DATA:  Acute renal failure EXAM: RENAL / URINARY TRACT ULTRASOUND COMPLETE COMPARISON:  None. FINDINGS: Right Kidney: Renal measurements: 9.5 x 4.8 x 5.4 cm = volume: 128.5 mL . Echogenicity within normal limits. There is a 1 mm nonobstructing stone in the midpole right kidney. No mass or hydronephrosis  visualized. Left Kidney: Renal measurements: 10 x 5.7 x 5.1  cm = volume: 151.9 mL. Echogenicity within normal limits. No mass or hydronephrosis visualized. Bladder: The bladder is decompressed limiting evaluation. IMPRESSION: No acute abnormality identified. 1 mm nonobstructing stone in midpole right kidney. No hydronephrosis is identified bilaterally. Electronically Signed   By: Sherian ReinWei-Chen  Lin M.D.   On: 11/29/2018 16:51   Dg Chest Port 1 View  Result Date: 11/29/2018 CLINICAL DATA:  Acute respiratory failure. EXAM: PORTABLE CHEST 1 VIEW COMPARISON:  11/22/2018 FINDINGS: Heart size is normal. Pulmonary vessels are slightly prominent and there is slight distention of the azygos vein, consistent with a supine position. No infiltrates or effusions. No acute bone abnormality. IMPRESSION: No active disease. Electronically Signed   By: Francene BoyersJames  Maxwell M.D.   On: 11/29/2018 17:31    Pending Labs Unresulted Labs (From admission, onward)    Start     Ordered   11/30/18 0500  CBC  Tomorrow morning,   R     11/29/18 1542   11/30/18 0500  Basic metabolic panel  Tomorrow morning,   R     11/29/18 1542   11/30/18 0500  Blood gas, arterial  Tomorrow morning,   R     11/29/18 1542   11/30/18 0500  Magnesium  Tomorrow morning,   R     11/29/18 1542   11/30/18 0500  Phosphorus  Tomorrow morning,   R     11/29/18 1542   11/30/18 0100  Heparin level (unfractionated)  Once,   R     11/29/18 1647   11/29/18 1738  Urea nitrogen, urine  Once,   R     11/29/18 1737   11/29/18 1738  Creatinine, urine, random  Once,   R     11/29/18 1737   11/29/18 1738  Protein, urine, random  Once,   R     11/29/18 1737   11/29/18 1738  Na and K (sodium & potassium), rand urine  Once,   R     11/29/18 1737   11/29/18 1716  Basic metabolic panel  ONCE - STAT,   R     11/29/18 1716   11/29/18 1634  Valproic acid level  Once,   R     11/29/18 1633   11/29/18 1553  APTT  Once,   R     11/29/18 1552   11/29/18 1553   Protime-INR  Once,   R     11/29/18 1552   11/29/18 1545  CK total and CKMB (cardiac)not at [facility]  Once,   R     11/29/18 1544   11/29/18 1544  Culture, Urine  Once,   R     11/29/18 1543   11/29/18 1542  Urine rapid drug screen (hosp performed)  ONCE - STAT,   R     11/29/18 1542   11/29/18 1540  Magnesium  Once,   R     11/29/18 1542   11/29/18 1540  Lipase, blood  Once,   R     11/29/18 1542   11/29/18 1540  Lactic acid, plasma  STAT Now then every 3 hours,   R     11/29/18 1542   11/29/18 1540  Brain natriuretic peptide  Once,   R     11/29/18 1542   11/29/18 1540  Culture, blood (routine x 2)  BLOOD CULTURE X 2,   R     11/29/18 1542   11/29/18 1428  Urinalysis, Routine w reflex microscopic  Once,   R  11/29/18 1427   11/29/18 1427  D-dimer, quantitative (not at [facility])  Once,   R     11/29/18 1426   11/29/18 1408  Gastrointestinal Panel by PCR , Stool  (Gastrointestinal Panel by PCR, Stool)  Once,   R     11/29/18 1408          Vitals/Pain Today's Vitals   11/29/18 1725 11/29/18 1730 11/29/18 1744 11/29/18 1800  BP: 97/73 90/66 91/69  107/75  Pulse: (!) 104 (!) 101 (!) 112 (!) 101  Resp: (!) 21 19 18 18   Temp:      TempSrc:      SpO2: 100% 97% 98% 100%  PainSc:   0-No pain     Isolation Precautions Enteric precautions (UV disinfection)  Medications Medications  sterile water (preservative free) injection (1.2 mLs  Not Given 11/23/18 0100)  norepinephrine (LEVOPHED) 4mg  in D5W premix infusion (30 mcg/min Intravenous Rate/Dose Change 11/29/18 1652)  0.9 %  sodium chloride infusion (has no administration in time range)  heparin ADULT infusion 100 units/mL (25000 units/278mL sodium chloride 0.45%) (1,200 Units/hr Intravenous New Bag/Given 11/29/18 1625)  vasopressin (PITRESSIN) 40 Units in sodium chloride 0.9 % 250 mL (0.16 Units/mL) infusion (0.04 Units/min Intravenous New Bag/Given 11/29/18 1659)  ceFEPIme (MAXIPIME) 2 g in sodium chloride 0.9 % 100 mL IVPB  (has no administration in time range)  ziprasidone (GEODON) injection 20 mg (20 mg Intramuscular Given by Other 11/21/18 0211)  ondansetron (ZOFRAN) injection 4 mg (4 mg Intravenous Given 11/21/18 0431)  sodium chloride 0.9 % bolus 1,000 mL (0 mLs Intravenous Stopped 11/21/18 0502)  cefTRIAXone (ROCEPHIN) injection 1 g (1 g Intramuscular Given 11/21/18 1740)  sterile water (preservative free) injection (10 mLs  Given 11/21/18 1740)  ziprasidone (GEODON) injection 20 mg (20 mg Intramuscular Given 11/21/18 2036)  sterile water (preservative free) injection (1.2 mLs  Given 11/21/18 2036)  OLANZapine (ZYPREXA) injection 5 mg (5 mg Intramuscular Given 11/22/18 1019)    And  diphenhydrAMINE (BENADRYL) injection 25 mg (25 mg Intramuscular Given 11/22/18 1019)  ziprasidone (GEODON) injection 20 mg (20 mg Intramuscular Given 11/23/18 0100)  sterile water (preservative free) injection (1.2 mLs  Given 11/23/18 0100)  sterile water (preservative free) injection (10 mLs  Given 11/23/18 1005)  potassium chloride SA (K-DUR,KLOR-CON) CR tablet 40 mEq (40 mEq Oral Given 11/25/18 0134)  sterile water (preservative free) injection (10 mLs  Given 11/24/18 0823)  LORazepam (ATIVAN) injection 2 mg (2 mg Intramuscular Given 11/25/18 1132)  diphenhydrAMINE (BENADRYL) injection 50 mg (50 mg Intramuscular Given 11/25/18 1132)  sterile water (preservative free) injection (10 mLs  Given 11/25/18 1132)  sodium chloride 0.9 % bolus 1,000 mL (0 mLs Intravenous Stopped 11/29/18 1454)  lactated ringers bolus 1,000 mL (0 mLs Intravenous Stopped 11/29/18 1626)  heparin bolus via infusion 3,000 Units (3,000 Units Intravenous Bolus from Bag 11/29/18 1633)  sodium chloride 0.9 % bolus 1,000 mL (0 mLs Intravenous Stopped 11/29/18 1545)    Mobility walks

## 2018-11-29 NOTE — Discharge Instructions (Addendum)
For your behavioral health needs, you are advised to follow up with an outpatient psychiatrist.  You have an intake appointment scheduled with Neila GearVincent Izediuno, MD, on Thursday, December 08, 2018 at 10:00 am.  Be sure to have your health insurance cards with you for the appointment:       Neila GearVincent Izediuno, MD      [facility]      [address]      [place], [place] [address]      [phone]  Information on my medicine - XARELTO (rivaroxaban)  This medication education was reviewed with me or my healthcare representative as part of my discharge preparation.  The pharmacist that spoke with me during my hospital stay was:  Berkley HarveyLegge, Nico Rogness Marshall, [facility]  WHY WAS Carlena HurlXARELTO PRESCRIBED FOR YOU? Xarelto was prescribed to treat blood clots that may have been found in the veins of your legs (deep vein thrombosis) or in your lungs (pulmonary embolism) and to reduce the risk of them occurring again.  What do you need to know about Xarelto? The starting dose is one 15 mg tablet taken TWICE daily with food for the FIRST 21 DAYS then on 12/25/18  the dose is changed to one 20 mg tablet taken ONCE A DAY with your evening meal.  DO NOT stop taking Xarelto without talking to the health care provider who prescribed the medication.  Refill your prescription for 20 mg tablets before you run out.  After discharge, you should have regular check-up appointments with your healthcare provider that is prescribing your Xarelto.  In the future your dose may need to be changed if your kidney function changes by a significant amount.  What do you do if you miss a dose? If you are taking Xarelto TWICE DAILY and you miss a dose, take it as soon as you remember. You may take two 15 mg tablets (total 30 mg) at the same time then resume your regularly scheduled 15 mg twice daily the next day.  If you are taking Xarelto ONCE DAILY and you miss a dose, take it as soon as you remember on the same day then  continue your regularly scheduled once daily regimen the next day. Do not take two doses of Xarelto at the same time.   Important Safety Information Xarelto is a blood thinner medicine that can cause bleeding. You should call your healthcare provider right away if you experience any of the following: ? Bleeding from an injury or your nose that does not stop. ? Unusual colored urine (red or dark brown) or unusual colored stools (red or black). ? Unusual bruising for unknown reasons. ? A serious fall or if you hit your head (even if there is no bleeding).  Some medicines may interact with Xarelto and might increase your risk of bleeding while on Xarelto. To help avoid this, consult your healthcare provider or pharmacist prior to using any new prescription or non-prescription medications, including herbals, vitamins, non-steroidal anti-inflammatory drugs (NSAIDs) and supplements.  This website has more information on Xarelto: [email or web address].xarelto.[email or web address].

## 2018-11-29 NOTE — Progress Notes (Signed)
eLink Physician-Brief Progress Note Patient Name: Stephanie AmabileCharlotte Loree DOB: 11/30/1955 MRN: [id number]   Date of Service  11/29/2018  HPI/Events of Note  Nausea - QTc interval = 0.42 seconds.   eICU Interventions  Will order: 1. Zofran 4 mg IV Q 6 hours PRN N/V.     Intervention Category Major Interventions: Other:  Lenell AntuSommer,Otelia Hettinger Eugene 11/29/2018, 9:59 PM

## 2018-11-29 NOTE — Procedures (Signed)
History: 62 year old female being  evaluated for altered mental status  Sedation: None  Technique: This is a 21 channel routine scalp EEG performed at the bedside with bipolar and monopolar montages arranged in accordance to the international 10/20 system of electrode placement. One channel was dedicated to EKG recording.    Background: The background consists of intermixed alpha and beta activities. There is a well defined posterior dominant rhythm of 9 Hz that attenuates with eye opening.  There is an increase in delta activity as well as rhythmic temporal theta of drowsiness seen with drowsiness.  Sleep is recorded with symmetric appearing structures.   Photic stimulation: Physiologic driving is not performed  EEG Abnormalities: None  Clinical Interpretation: This normal EEG is recorded in the waking and sleep state. There was no seizure or seizure predisposition recorded on this study. Please note that lack of epileptiform activity on EEG does not preclude the possibility of epilepsy.   Stephanie SlotMcNeill Srihith Aquilino, MD [facility] [phone]  If 7pm- 7am, please page neurology on call as listed in AMION.

## 2018-11-29 NOTE — ED Provider Notes (Signed)
Assumed care from multiple previous providers, please see their note for full history, physical and decision making until this point. In brief this is a 62 y.o. year old female who presented to the ED tonight with Agitation and IVC      Patient had been involuntarily committed secondary to psychosis to get her medications titrated.  She been doing well and has been pending placement but then is improving pretty significantly so was planning for discharge today.  Nursing states that she gave her close patients that she was in gets her self ready and she heard a's mall thought she went and the patient is unresponsive on the floor.  I arrived on my evaluation patient was continually unresponsive even to pain.  Her eyes were open but she would not follow commands nor withdraw to pain. Pupils equal. Patient was put on a spine board with spinal precautions and a cervical collar placed.  Her blood sugar was above 90.  Blood pressure was around 60/40 heart rate was in the 40s.  She was slightly tachypneic but had oxygenation above 90%.  EKG was done and showed any significant abnormalities except for mild ST elevation and T wave inversion in V2 but no contiguous leads nor reciprocal changes.  Patient was taken straight to CT scanner where a head and neck CT were done and taken back to resuscitation bay.  Patient started having improvement in her symptoms in the resuscitation bay.  Fluids were continued aspirin was given and I discussed the EKG findings with cardiology.  Repeat EKG was done that showed resolution of those symptoms.  Cardiology did not think this was a STEMI or primary cardiac event.  Maybe this could be possible arrhythmia versus ST changes secondary to the low blood pressure and felt that she would likely need to be admitted and monitored overnight at least with the medicine service.   Ultrasound done as documented below that showed dilated IVC and what appeared to be contractility for possible PE  so d-dimer was added on.  Recheck of her CBC, BMP was also ordered.  Patient's mental status slowly improving.  We will continue to monitor and admit to the hospital after labs come back and work-up completed in the emergency room.   Physical Exam  BP 97/62   Pulse 65   Temp 98.6 F (37 C) (Rectal)   Resp (!) 23   SpO2 99%   Physical Exam Vitals signs and nursing note reviewed.  Constitutional:      General: She is in acute distress.     Appearance: She is diaphoretic.  HENT:     Head: Normocephalic and atraumatic.     Nose: Nose normal.     Mouth/Throat:     Mouth: Mucous membranes are dry.  Neck:     Musculoskeletal: No muscular tenderness.  Cardiovascular:     Rate and Rhythm: Bradycardia present.  Abdominal:     General: Abdomen is flat. There is no distension.  Musculoskeletal: Normal range of motion.  Neurological:     Comments: unresponsiveness     ED Course/Procedures     .Critical Care Performed by: Marily MemosMesner, Hortensia Duffin, MD Authorized by: Marily MemosMesner, Iyanla Eilers, MD   Critical care provider statement:    Critical care time (minutes):  45   Critical care was necessary to treat or prevent imminent or life-threatening deterioration of the following conditions:  Cardiac failure, circulatory failure, shock, CNS failure or compromise and dehydration   Critical care was time spent personally by  me on the following activities:  Discussions with consultants, evaluation of patient's response to treatment, examination of patient, ordering and performing treatments and interventions, ordering and review of laboratory studies, ordering and review of radiographic studies, pulse oximetry, re-evaluation of patient's condition, obtaining history from patient or surrogate and review of old charts   I assumed direction of critical care for this patient from another provider in my specialty: no   .Central Line Date/Time: 11/29/2018 4:15 PM Performed by: Marily MemosMesner, Arwyn Besaw, MD Authorized by: Marily MemosMesner,  Abubakr Wieman, MD   Consent:    Consent obtained:  Emergent situation Pre-procedure details:    Hand hygiene: Hand hygiene performed prior to insertion     Sterile barrier technique: All elements of maximal sterile technique followed     Skin preparation:  2% chlorhexidine   Skin preparation agent: Skin preparation agent completely dried prior to procedure   Anesthesia (see MAR for exact dosages):    Anesthesia method:  Local infiltration   Local anesthetic:  Lidocaine 1% w/o epi Procedure details:    Location:  R femoral   Patient position:  Flat   Procedural supplies:  Triple lumen   Landmarks identified: no     Ultrasound guidance: yes     Sterile ultrasound techniques: Sterile gel and sterile probe covers were used     Number of attempts:  1   Successful placement: yes   Post-procedure details:    Post-procedure:  Dressing applied and line sutured   Assessment:  Blood return through all ports   Patient tolerance of procedure:  Tolerated well, no immediate complications    MDM   Concern for possible PE with dilated IVC and normal contractility. Heparin started. Levophed started. Discussed with CCM who will see and manage from here. Attempted to contact patient's significant other whose phone number is inaccurate.        Marily MemosMesner, Tanesia Butner, MD 11/29/18 [phone]

## 2018-11-29 NOTE — BH Assessment (Addendum)
[facility] Assessment Progress Note  Per Thresa RossNadeem Akhtar, MD, this pt does not require psychiatric hospitalization at this time.  [facility] with recommendation to follow up with an outpatient psychiatrist.  This writer [facility] and spoke to [place], who has scheduled pt for an intake appointment on Thursday, 12/08/2018 at 10:00.  This has been included in pt's discharge instructions.  Caryn Beeakia Starkes Perry, NP agrees to provide pt with scripts for psychotropic medications to tide pt over until appointment.  At 11:50, after placing numerous calls, this writer spoke to pt's significant other, Sunday ShamsJason Goldsberry [phone]).  Mr Nila NephewGoldsberry is currently petitioning for guardianship of pt.  He agrees with this plan, and reports that [facility].  Pt presents under IVC initiated by EDP Alona BeneJoshua Long, MD, which EDP Marily MemosJason Mesner, MD has rescinded.  Pt's nurse, Gabriel Earingaquita, has been notified.  Doylene Canninghomas Shley Dolby, MA Triage Specialist [phone]

## 2018-11-29 NOTE — H&P (Addendum)
NAME:  Stephanie Snyder, MRN:  [id number], DOB:  08/06/1956, LOS: 0 ADMISSION DATE:  11/21/2018, CONSULTATION DATE:  12/31 REFERRING MD:  Mesner, CHIEF COMPLAINT:  Acute encephalopathy and circulatory shock   Brief History   62 year old female admitted 12/31 from psychiatric holding where she was being treated for acute psychosis felt possibly exacerbated by UTI since 12/24.  The symptoms had improved, she was to be discharged home.  While up at sink preparing for discharge she had a syncopal event, fell, was minimally responsive and hypotensive.  Sent to ER for further evaluation.  History of present illness   62 year old female who initially presented to the ER initially on 12/23 w/ acute psychosis reporting that "the [employer]KK was going to blow up her house". Police were called she was combative and needed to be restrained. She was transferred to the hospital for further evaluation. Her initial workup was negative for significant metabolic derangements w/ the exception of possible UTI and she was to be admitted w/ working dx of acute psychosis in setting of UTI. Treatment included: psychiatric eval, geodone, then started on zyprexa she was to be admitted to in-patient psych for titration of meds. On 12/27 she was started in Depakote & Risperdal. As of 12/28 she had finally been starting to improve. Was calmer, cooperative and not agitated.  On 12/29 haldol decreased d/t prolonged qtc. Cogentin started. On 12/31 she was appropriate, cooperative. Felt it was OK to dc her to home. While preparing for dc pt developed acute Mental status change whole the pt was up at sink staff heard a loud thud and found pt on floor. Inc of urine and gasping for air.  She was emergently transferred to the ER once again from holding unit.  On arrival: hypotensive. Obtunded. Scr had increased from: 1.02 to 7.91. Bedside US showed distended IVC.  CT head negative. Was started on gentle IVFs and pressors started. PCCM asked to  admit.    Past Medical History  HTN, CVA   Significant Hospital Events   12/23: Brought to emergency room acutely psychotic with delusions and hallucinations.  Yelling the [employer]KK was going to blow up her house, was combative with police officers, required restraints.  Initially treated with IV Geodon and started on Zyprexa. 12/27: Still agitated and delirious.  Started on Depakote and Risperdal 12/28 mild improvement 1229 worse again, required decreased Haldol dosing due to prolonged QTC.  Cogentin started 12/31 was preparing for her discharge to home as clinically had improved.  Had syncopal episode while preparing for discharge at sink.  Following this was lethargic, minimally responsive, and hypotensive.  Evaluation in ER showed creatinine rising from 1.027 days ago to 7.91.  Ultra sound showed dilated inferior vena cava.  Central access placed by emergency room physician, critical care asked to evaluate for shock of unclear etiology  Consults:    Procedures:  Right femoral vein catheter 12/31  Significant Diagnostic Tests:  CT brain 12/31- for bleed  Micro Data:  12/31 blood culture x2 12/31 urine culture  Antimicrobials:    Interim history/subjective:  More responsive after norepinephrine initiated  Objective   Blood pressure (Abnormal) 70/57, pulse 85, temperature 98.6 F (37 C), temperature source Rectal, resp. rate (Abnormal) 23, SpO2 98 %.       No intake or output data in the 24 hours ending 11/29/18 1544 There were no vitals filed for this visit.  Examination: General: 62 year old female now more responsive after norepinephrine infusion initiated HENT:  Mucous membranes dry no neck vein distention normocephalic atraumatic nonicteric Lungs: Clear to auscultation, no accessory use Cardiovascular: Regular rate and rhythm without murmur rub or gallop Abdomen: Soft nontender no organomegaly Extremities: Warm dry brisk cap refill Neuro: Now awake, oriented to self,  moves all extremities, initially would not follow commands now well. GU: Due to void  Resolved Hospital Problem list     Assessment & Plan:   Shock, circulatory.  Etiology is currently unclear.  Review of psychiatric holding records suggest poor p.o. intake, and now has acute renal failure so volume depletion would be a consideration, however dilated IVC would argue against this, also consider possible pulmonary emboli, or less likely cardiac event given negative EKG.  Sepsis seems unlikely. Plan Send urine and blood cultures Continue maintenance IV fluids, will check a chest x-ray to rule out pulmonary edema Holding all sedating medications Repeating echocardiogram looking for RV dysfunction, and evidence of pulmonary emboli Lower extremity Dopplers IV heparin for now until we can r/o PE Norepinephrine for mean arterial pressure greater than 65 She is currently protecting her airway so no indication for intubation  Acute toxic/metabolic encephalopathy. Suspect that this is a mix of polypharmacy on top of acute renal failure.  Certainly could consider postictal state following a seizure as well.  There was question about seizure type activity.  Treatment of her hypotension also seem to improve her mental status Plan Hold antipsychotics EEG Correct metabolic abnormalities Serial neuro checks Repeat urine drug screen Check serum Depakote Check ammonia level  Acute renal failure Plan Continue IV hydration Checking renal ultrasound Repeating urinalysis Place Foley catheter Renal dose medication Serial chemistries Currently no need for dialysis   Best practice:  Diet: NPO Pain/Anxiety/Delirium protocol (if indicated): na VAP protocol (if indicated): na DVT prophylaxis: IV heparin  GI prophylaxis: na  Glucose control: na Mobility: BR Code Status: full code  Family Communication: pending  Disposition: She is critically ill requiring close titration of vasoactive drips  given refractory circulatory shock of unclear etiology.  She will require admission to the intensive care, if renal function does not improve will require nephrology consultation and possibly CRRT.  Labs   CBC: Recent Labs  Lab 11/29/18 1133  WBC 8.3  NEUTROABS 5.8  HGB 12.8  HCT 40.4  MCV 84.5  PLT 175    Basic Metabolic Panel: Recent Labs  Lab 11/29/18 1416  NA 141  K 3.7  CL 102  CO2 23  GLUCOSE 136*  BUN 63*  CREATININE 7.91*  CALCIUM 8.6*  MG 2.4   GFR: CrCl cannot be calculated (Unknown ideal weight.). Recent Labs  Lab 11/29/18 1133  WBC 8.3    Liver Function Tests: Recent Labs  Lab 11/29/18 1416  AST 25  ALT 14  ALKPHOS 51  BILITOT 1.1  PROT 6.9  ALBUMIN 3.5   No results for input(s): LIPASE, AMYLASE in the last 168 hours. No results for input(s): AMMONIA in the last 168 hours.  ABG No results found for: PHART, PCO2ART, PO2ART, HCO3, TCO2, ACIDBASEDEF, O2SAT   Coagulation Profile: No results for input(s): INR, PROTIME in the last 168 hours.  Cardiac Enzymes: No results for input(s): CKTOTAL, CKMB, CKMBINDEX, TROPONINI in the last 168 hours.  HbA1C: No results found for: HGBA1C  CBG: Recent Labs  Lab 11/29/18 1327  GLUCAP 94    Review of Systems:   na  Past Medical History  She,  has a past medical history of Arthritis, Asthma, CVA (cerebral vascular accident) (HCC) (  2008), Hypertension, and Irregular heart beat (2015).   Surgical History    Past Surgical History:  Procedure Laterality Date  . TUBAL LIGATION  1984     Social History   reports that she has never smoked. She has never used smokeless tobacco. She reports that she does not drink alcohol or use drugs.   Family History   Her family history includes Cancer in her father.   Allergies Allergies  Allergen Reactions  . Latex Hives     Home Medications  Prior to Admission medications   Medication Sig Start Date End Date Taking? Authorizing Provider  albuterol  (PROVENTIL HFA;VENTOLIN HFA) 108 (90 Base) MCG/ACT inhaler Inhale 2 puffs into the lungs every 6 (six) hours as needed for wheezing or shortness of breath. 08/11/18  Yes Levert FeinsteinGranfortuna, James M, MD  amLODipine (NORVASC) 10 MG tablet Take 1 tablet (10 mg total) by mouth daily. 08/11/18 11/21/18 Yes Levert FeinsteinGranfortuna, James M, MD  diclofenac sodium (VOLTAREN) 1 % GEL Apply 2 g topically 4 (four) times daily. As needed 08/11/18  Yes Levert FeinsteinGranfortuna, James M, MD  lisinopril-hydrochlorothiazide (PRINZIDE,ZESTORETIC) 20-25 MG tablet Take 1 tablet by mouth daily. 08/11/18 09/10/19 Yes Levert FeinsteinGranfortuna, James M, MD  meloxicam (MOBIC) 7.5 MG tablet Take 1 tablet (7.5 mg total) by mouth daily as needed for pain. 08/11/18  Yes Levert FeinsteinGranfortuna, James M, MD  benztropine (COGENTIN) 0.5 MG tablet Take 1 tablet (0.5 mg total) by mouth 2 (two) times daily. 11/29/18   Starkes-Perry, Juel Burrowakia S, FNP  divalproex (DEPAKOTE) 250 MG DR tablet Take 1 tablet (250 mg total) by mouth every 12 (twelve) hours. 11/29/18   Starkes-Perry, Juel Burrowakia S, FNP  haloperidol (HALDOL) 5 MG tablet Take 1 tablet (5 mg total) by mouth 2 (two) times daily. 11/29/18   Maryagnes AmosStarkes-Perry, Takia S, FNP     Critical care time:  45 min     Simonne MartinetPeter E Mercedes Fort ACNP-BC [facility]Critical [facility] Pager # [phone] OR # [phone] if no answer

## 2018-11-29 NOTE — ED Notes (Signed)
RN, NT and MD at bedside for procedure.

## 2018-11-29 NOTE — ED Notes (Signed)
EEG being done at bedside, patient is following commands.

## 2018-11-29 NOTE — ED Notes (Addendum)
Patient ready for discharge. Explained to patient that boyfriend was on the way back to pick her up for discharge. Patient lethargic put awake to here instructions. Verbalized understanding. Reported that she was going to wash up at sink.

## 2018-11-29 NOTE — ED Notes (Signed)
Attempted in and out cath x2 less then 2 cc returned. MD made aware. PT BP is 64/36 ( MD aware). MD aware we will attempt in and out after fluids finish.

## 2018-11-29 NOTE — ED Notes (Signed)
Woke this am for vitals and to ask her if she would consent to an EKG this am. When she moved in bed to sit up for vitals she winced. Asked if she hurt and again, rather than speaking to me she pulled up her pant leg to show me a swollen right knee. Tylenol prn available to her and she took it. She agreed to EKG. Lips are dry and cracked and vitals indicate she may be a little dehydrated. She accepted two cups of apple juice and did speak her response to what she wanted to drink. Cooperated with EKG.

## 2018-11-29 NOTE — ED Notes (Signed)
Bladder scan showed . Attempted to in and out cath pt. No urine return
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
## 2018-11-29 NOTE — Progress Notes (Signed)
eLink Physician-Brief Progress Note Patient Name: Stephanie AmabileCharlotte Snyder DOB: 08/06/1956 MRN: [id number]   Date of Service  11/29/2018  HPI/Events of Note  Lactic acid = 4.3.   eICU Interventions  Will order: 1. Bolus with 0.9 NaCl 1 liter IV over 1 hour now.      Intervention Category Major Interventions: Acid-Base disturbance - evaluation and management  Sommer,Steven Eugene 11/29/2018, 8:43 PM

## 2018-11-29 NOTE — ED Notes (Signed)
Has slept all night, never has she gotten up to use the bathroom and has not eaten or drank anything more than the swallow or two of H2O she took her HS meds with and she drank a sip of lemonade.this shift

## 2018-11-29 NOTE — ED Notes (Signed)
Per MD: Begin levophed in PIV while central line is being placed

## 2018-11-29 NOTE — ED Notes (Signed)
Bed: RESB Expected date:  Expected time:  Means of arrival:  Comments: RM 26

## 2018-11-30 ENCOUNTER — Inpatient Hospital Stay (HOSPITAL_COMMUNITY): Payer: Medicare Other

## 2018-11-30 DIAGNOSIS — R7989 Other specified abnormal findings of blood chemistry: Secondary | ICD-10-CM

## 2018-11-30 DIAGNOSIS — I361 Nonrheumatic tricuspid (valve) insufficiency: Secondary | ICD-10-CM

## 2018-11-30 DIAGNOSIS — M7989 Other specified soft tissue disorders: Secondary | ICD-10-CM

## 2018-11-30 LAB — BLOOD GAS, ARTERIAL
Acid-base deficit: 6.4 mmol/L — ABNORMAL HIGH (ref 0.0–2.0)
Bicarbonate: 17.5 mmol/L — ABNORMAL LOW (ref 20.0–28.0)
Drawn by: 422461
O2 Content: 6 L/min
O2 Saturation: 98.3 %
PH ART: 7.366 (ref 7.350–7.450)
Patient temperature: 98.7
pCO2 arterial: 31.3 mmHg — ABNORMAL LOW (ref 32.0–48.0)
pO2, Arterial: 136 mmHg — ABNORMAL HIGH (ref 83.0–108.0)

## 2018-11-30 LAB — BASIC METABOLIC PANEL WITH GFR
Anion gap: 9 (ref 5–15)
BUN: 41 mg/dL — ABNORMAL HIGH (ref 8–23)
CO2: 23 mmol/L (ref 22–32)
Calcium: 7.6 mg/dL — ABNORMAL LOW (ref 8.9–10.3)
Chloride: 105 mmol/L (ref 98–111)
Creatinine, Ser: 1.99 mg/dL — ABNORMAL HIGH (ref 0.44–1.00)
GFR calc Af Amer: 30 mL/min — ABNORMAL LOW
GFR calc non Af Amer: 26 mL/min — ABNORMAL LOW
Glucose, Bld: 180 mg/dL — ABNORMAL HIGH (ref 70–99)
Potassium: 4.1 mmol/L (ref 3.5–5.1)
Sodium: 137 mmol/L (ref 135–145)

## 2018-11-30 LAB — CBC
HCT: 41.2 % (ref 36.0–46.0)
Hemoglobin: 12.9 g/dL (ref 12.0–15.0)
MCH: 26.5 pg (ref 26.0–34.0)
MCHC: 31.3 g/dL (ref 30.0–36.0)
MCV: 84.8 fL (ref 80.0–100.0)
Platelets: 160 10*3/uL (ref 150–400)
RBC: 4.86 MIL/uL (ref 3.87–5.11)
RDW: 13.2 % (ref 11.5–15.5)
WBC: 12 10*3/uL — ABNORMAL HIGH (ref 4.0–10.5)
nRBC: 0 % (ref 0.0–0.2)

## 2018-11-30 LAB — BLOOD CULTURE ID PANEL (REFLEXED)
Acinetobacter baumannii: NOT DETECTED
Candida albicans: NOT DETECTED
Candida glabrata: NOT DETECTED
Candida krusei: NOT DETECTED
Candida parapsilosis: NOT DETECTED
Candida tropicalis: NOT DETECTED
Enterobacter cloacae complex: NOT DETECTED
Enterobacteriaceae species: NOT DETECTED
Enterococcus species: NOT DETECTED
Escherichia coli: NOT DETECTED
Haemophilus influenzae: NOT DETECTED
Klebsiella oxytoca: NOT DETECTED
Klebsiella pneumoniae: NOT DETECTED
Listeria monocytogenes: NOT DETECTED
METHICILLIN RESISTANCE: DETECTED — AB
Neisseria meningitidis: NOT DETECTED
PROTEUS SPECIES: NOT DETECTED
Pseudomonas aeruginosa: NOT DETECTED
SERRATIA MARCESCENS: NOT DETECTED
STAPHYLOCOCCUS AUREUS BCID: NOT DETECTED
Staphylococcus species: DETECTED — AB
Streptococcus agalactiae: NOT DETECTED
Streptococcus pneumoniae: NOT DETECTED
Streptococcus pyogenes: NOT DETECTED
Streptococcus species: NOT DETECTED

## 2018-11-30 LAB — GASTROINTESTINAL PANEL BY PCR, STOOL (REPLACES STOOL CULTURE)

## 2018-11-30 LAB — CK TOTAL AND CKMB (NOT AT ARMC)
CK TOTAL: 109 U/L (ref 38–234)
CK, MB: 10.3 ng/mL — AB (ref 0.5–5.0)
Relative Index: 9.4 — ABNORMAL HIGH (ref 0.0–2.5)

## 2018-11-30 LAB — BASIC METABOLIC PANEL
Anion gap: 13 (ref 5–15)
BUN: 51 mg/dL — ABNORMAL HIGH (ref 8–23)
CO2: 15 mmol/L — ABNORMAL LOW (ref 22–32)
Calcium: 7.3 mg/dL — ABNORMAL LOW (ref 8.9–10.3)
Chloride: 108 mmol/L (ref 98–111)
Creatinine, Ser: 4.44 mg/dL — ABNORMAL HIGH (ref 0.44–1.00)
GFR calc Af Amer: 11 mL/min — ABNORMAL LOW (ref >60)
GFR calc non Af Amer: 10 mL/min — ABNORMAL LOW (ref >60)
GLUCOSE: 289 mg/dL — AB (ref 70–99)
Potassium: 4.8 mmol/L (ref 3.5–5.1)
Sodium: 136 mmol/L (ref 135–145)

## 2018-11-30 LAB — ECHOCARDIOGRAM COMPLETE
Height: 62 in
Weight: 2352.75 oz

## 2018-11-30 LAB — MAGNESIUM: Magnesium: 1.8 mg/dL (ref 1.7–2.4)

## 2018-11-30 LAB — LACTIC ACID, PLASMA: LACTIC ACID, VENOUS: 2.3 mmol/L — AB (ref 0.5–1.9)

## 2018-11-30 LAB — HEPARIN LEVEL (UNFRACTIONATED)
Heparin Unfractionated: 1.18 IU/mL — ABNORMAL HIGH (ref 0.30–0.70)
Heparin Unfractionated: 1.44 IU/mL — ABNORMAL HIGH (ref 0.30–0.70)
Heparin Unfractionated: 0.62 IU/mL (ref 0.30–0.70)

## 2018-11-30 LAB — BRAIN NATRIURETIC PEPTIDE: B Natriuretic Peptide: 177.9 pg/mL — ABNORMAL HIGH (ref 0.0–100.0)

## 2018-11-30 LAB — PHOSPHORUS: Phosphorus: 4.8 mg/dL — ABNORMAL HIGH (ref 2.5–4.6)

## 2018-11-30 MED ORDER — HEPARIN (PORCINE) 25000 UT/250ML-% IV SOLN
600.0000 [IU]/h | INTRAVENOUS | Status: DC
  Administered 2018-11-30: 600 [IU]/h via INTRAVENOUS

## 2018-11-30 MED ORDER — ACETAMINOPHEN 500 MG PO TABS
500.0000 mg | ORAL_TABLET | Freq: Four times a day (QID) | ORAL | Status: DC | PRN
  Administered 2018-11-30 – 2018-12-08 (×8): 500 mg via ORAL
  Filled 2018-11-30 (×8): qty 1

## 2018-11-30 MED ORDER — SODIUM BICARBONATE 8.4 % IV SOLN
Freq: Once | INTRAVENOUS | Status: AC
  Administered 2018-11-30: 10:00:00 via INTRAVENOUS
  Filled 2018-11-30: qty 150

## 2018-11-30 MED ORDER — NOREPINEPHRINE 16 MG/250ML-% IV SOLN
0.0000 ug/min | INTRAVENOUS | Status: DC
  Filled 2018-11-30 (×2): qty 250

## 2018-11-30 NOTE — Progress Notes (Signed)
PHARMACY - PHYSICIAN COMMUNICATION CRITICAL VALUE ALERT - BLOOD CULTURE IDENTIFICATION (BCID)  Stephanie Snyder is an 63 y.o. female who [facility] with acute encephalopathy and circulatory shock.  Assessment:  Patient is clinically improving on Cefepime.  MRSA PCR-, weaning off pressors.  Still with low grade fever.   BCx 1/2 +CoNS which is possibly contaminant.   Name of physician (or Provider) Contacted: Icard  Current antibiotics: Cefepime 1gm IV q24h  Changes to prescribed antibiotics recommended:  None  Results for orders placed or performed during the hospital encounter of 11/21/18  Blood Culture ID Panel (Reflexed) (Collected: 11/29/2018  4:00 PM)  Result Value Ref Range   Enterococcus species NOT DETECTED NOT DETECTED   Listeria monocytogenes NOT DETECTED NOT DETECTED   Staphylococcus species DETECTED (A) NOT DETECTED   Staphylococcus aureus (BCID) NOT DETECTED NOT DETECTED   Methicillin resistance DETECTED (A) NOT DETECTED   Streptococcus species NOT DETECTED NOT DETECTED   Streptococcus agalactiae NOT DETECTED NOT DETECTED   Streptococcus pneumoniae NOT DETECTED NOT DETECTED   Streptococcus pyogenes NOT DETECTED NOT DETECTED   Acinetobacter baumannii NOT DETECTED NOT DETECTED   Enterobacteriaceae species NOT DETECTED NOT DETECTED   Enterobacter cloacae complex NOT DETECTED NOT DETECTED   Escherichia coli NOT DETECTED NOT DETECTED   Klebsiella oxytoca NOT DETECTED NOT DETECTED   Klebsiella pneumoniae NOT DETECTED NOT DETECTED   Proteus species NOT DETECTED NOT DETECTED   Serratia marcescens NOT DETECTED NOT DETECTED   Haemophilus influenzae NOT DETECTED NOT DETECTED   Neisseria meningitidis NOT DETECTED NOT DETECTED   Pseudomonas aeruginosa NOT DETECTED NOT DETECTED   Candida albicans NOT DETECTED NOT DETECTED   Candida glabrata NOT DETECTED NOT DETECTED   Candida krusei NOT DETECTED NOT DETECTED   Candida parapsilosis NOT DETECTED NOT  DETECTED   Candida tropicalis NOT DETECTED NOT DETECTED    Elson ClanLilliston, Juelz Whittenberg Michelle 11/30/2018  3:36 PM

## 2018-11-30 NOTE — Progress Notes (Signed)
11/30/17 2nd shift ED CSW received a handoff from the 1st shift [facility] ED disposition stating pt has an appointment that needs to be known about by the 1st shift CSW.  See below note from disposition:  At 09:12 this Clinical research associate (disposition) called pt's nurse, Revonda Standard.  I informed her that pt had been at [facility] for about a week under IVC while we sought psychiatric placement for her, but that yesterday, 11/29/2018 she had been psychiatrically cleared and released from IVC.  I also informed her that pt has an intake appointment scheduled for outpatient psychiatry with Neila Gear, MD, on 12/08/2017 at 10:00.  Please see pt's AVS for the [facility] encounter.  I asked if appointment needs to be cancelled at this time, and it was decided that it should be kept for the time being.  2nd shift ED CSW will leave handoff for 1st shift CSW.  Please reconsult if future social work needs arise.  CSW signing off, as social work intervention is no longer needed.  Dorothe Pea. Latorie Montesano, LCSW, LCAS, CSI Clinical Social Worker Ph: [phone]

## 2018-11-30 NOTE — Progress Notes (Signed)
Bilateral lower extremities venous duplex exam completed. Positive for Acute Deep veins thrombosis involving bilateral Posterior Tibial and Peroneal veins.  More details please see preliminary notes on CV PROC under chart review. Result notified RN.  Shadow Stiggers H Sham Alviar(RDMS RVT) 11/30/18 2:10 PM

## 2018-11-30 NOTE — Progress Notes (Signed)
ANTICOAGULATION CONSULT NOTE   Pharmacy Consult for Heparin  Indication: pulmonary embolus  Allergies  Allergen Reactions  . Latex Hives   Patient Measurements: Height: 5\' 2"  (157.5 cm) Weight: 147 lb 0.8 oz (66.7 kg) IBW/kg (Calculated) : 50.1 Heparin Dosing Weight: last recorded weight September 2019 was 152lb, 70kg  Vital Signs: Temp: 100.9 F (38.3 C) (01/01 1300) Temp Source: Core (01/01 0800) BP: 109/71 (01/01 1230) Pulse Rate: 113 (01/01 1300)  Labs: Recent Labs    11/29/18 1133 11/29/18 1416 11/29/18 1905 11/30/18 0046 11/30/18 0313 11/30/18 1150  HGB 12.8  --   --   --  12.9  --   HCT 40.4  --   --   --  41.2  --   PLT 175  --   --   --  160  --   APTT  --   --  169*  --   --   --   LABPROT  --   --  16.5*  --   --   --   INR  --   --  1.35  --   --   --   HEPARINUNFRC  --   --   --  1.18*  --  1.44*  CREATININE  --  7.91* 6.77*  --  4.44*  --   CKTOTAL  --   --   --   --  109  --   CKMB  --   --   --   --  10.3*  --    Estimated Creatinine Clearance: 11.5 mL/min (A) (by C-G formula based on SCr of 4.44 mg/dL (H)).  Medical History: Past Medical History:  Diagnosis Date  . Arthritis   . Asthma   . CVA (cerebral vascular accident) (HCC) 2008  . Hypertension   . Irregular heart beat 2015   Medications:  Scheduled:  . Chlorhexidine Gluconate Cloth  6 each Topical Daily  . mouth rinse  15 mL Mouth Rinse BID  . sodium chloride flush  10-40 mL Intracatheter Q12H   Infusions:  . sodium chloride Stopped (11/30/18 0802)  . ceFEPime (MAXIPIME) IV Stopped (11/29/18 2029)  . heparin    . norepinephrine (LEVOPHED) Adult infusion    . vasopressin (PITRESSIN) infusion - *FOR SHOCK* 0.04 Units/min (11/30/18 1300)    Assessment: 62 yoF in TCU x 8 days, getting dressed for d/c 12/31 >> syncopal event >> floor, hypotensive. Head/Neck CT: neg for trauma, bleed. Begin Heparin for poss PE PMH: no seizure hx, was on Depakote/Haldol for psych issues   Heparin  3000 unit bolus, initial infusion at 1200 units/hr  0046 Hep level = 1.18, above desired range, Heparin infusion to 1000 units/hr  Very poor renal function, patient not clearing Heparin  Today, 11/30/2018  1200 Hep level = 1.44, even higher than 1st level, and despite rate reduction  No s/s bleed, lab drawn appropriately  Goal of Therapy:  Heparin level 0.3-0.7 units/ml Monitor platelets by anticoagulation protocol: Yes   Plan:   Hold Heparin x 1 hr 1pm to 2pm today  Reduce Heparin to 600 units/hr  Recheck level at 2200 tonight  Daily cbc, plan daily Heparin level at steady state  Otho Bellows PharmD Pager [phone] 11/30/2018, 1:45 PM

## 2018-11-30 NOTE — Consult Note (Signed)
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 kg/m.  General Appearance: Fairly Groomed  Eye Contact:  None  Speech:  Garbled  Volume:  Decreased  Mood:  unable to answer  Affect:  drowsy  Thought Process:  Disorganized  Orientation:  Other:  oriented to place only  Thought Content:  no apparent paranoia  Suicidal Thoughts:  No  Homicidal Thoughts:  No  Memory:  Immediate;   Poor  Judgement:  Poor  Insight:  Lacking  Psychomotor Activity:  Decreased  Concentration:  Concentration: Poor and Attention Span: Poor  Recall:  Poor  Fund of Knowledge:  unable to assess due to current mental state  Language:  unable to assess due to current mental state  Akathisia:  No  Handed:  Right  AIMS (if indicated):     Assets:  Social Support  ADL's:  Impaired  Cognition:  Impaired,  Mild  Sleep:   poor   Assessment Chana Lindstrom is a 63 y.o. female patient with recent episode of psychosis/resolving, hypertension, CVA, who is admitted to ICU for altered mental status change with acute kidney injury; found to have bilateral DVT.   # history of psychosis in the setting of UTI Although exam is limited due to altered mental status likely secondary to her medical condition, she has not displayed any behavioral disturbances since admission to ICU. Will hold home medication at this time to avoid risk of sedation; these medication can be restarted when there is improvement in her altered mental status. However, consider holding  Depakote if Cre remains elevated given it needs dose adjustment based on kidney function. Will have haldol prn for agitation. Would advise outpatient psychiatry follow up after she is discharged.   Recommendation  - Agree with holding her home medication: Depakote 250mg  po q12 hours, Haldol 5mg  po BID for psychosis, and Cogentin 0.5mg  po BID -  Restart home medication when there is improvement in her mental status/alertness. If her Cre remains elevated, hold Depakote - Consider haldol 1 mg IV BID prn for agitation  - Monitor QTc prolongation if patient is given IV haldol (Hold haldol if QTc > 500 mg sec) - Delirium precautions - Minimize/avoid deliriogenic meds including: anticholinergic, opiates, benzodiazepines           -  Maintain hydration, oxygenation, nutrition           - Limit use of restraints and catheters           - Normalize sleep patterns by minimizing nighttime noise, light and interruptions by                clustering care, opening blinds during the day           - Reorient the patient frequently, provide easily visible clock and calendar           - Provide sensory aids like glasses, hearing aids           - Encourage ambulation, regular activities and visitors to maintain cognitive stimulation   Treatment Plan Summary: Plan as above  Disposition: Patient does not meet criteria for psychiatric inpatient admission.  Thank you for your consult. Will sign off. Please contact psychiatry if you have any questions, or any need for re-evaluation.  Neysa Hotter, MD 11/30/2018 3:37 PM

## 2018-11-30 NOTE — Progress Notes (Signed)
Pharmacy: Changing Levophed to quad strength with next bag. RN will reprogram alaris pump at that time Herby Abraham, Pharm.D 11/30/2018 8:53 AM

## 2018-11-30 NOTE — Progress Notes (Signed)
NAME:  Stephanie Snyder, MRN:  [id number], DOB:  Jun 03, 1956, LOS: 1 ADMISSION DATE:  11/21/2018, CONSULTATION DATE:  12/31 REFERRING MD:  Mesner, CHIEF COMPLAINT:  Acute encephalopathy and circulatory shock   Brief History   63 year old female admitted 12/31 from psychiatric holding where she was being treated for acute psychosis felt possibly exacerbated by UTI since 12/24.  The symptoms had improved, she was to be discharged home.  While up at sink preparing for discharge she had a syncopal event, fell, was minimally responsive and hypotensive.  Sent to ER for further evaluation.  History of present illness   63 year old female who initially presented to the ER initially on 12/23 w/ acute psychosis reporting that "the [employer]KK was going to blow up her house". Police were called she was combative and needed to be restrained. She was transferred to the hospital for further evaluation. Her initial workup was negative for significant metabolic derangements w/ the exception of possible UTI and she was to be admitted w/ working dx of acute psychosis in setting of UTI. Treatment included: psychiatric eval, geodone, then started on zyprexa she was to be admitted to in-patient psych for titration of meds. On 12/27 she was started in Depakote & Risperdal. As of 12/28 she had finally been starting to improve. Was calmer, cooperative and not agitated.  On 12/29 haldol decreased d/t prolonged qtc. Cogentin started. On 12/31 she was appropriate, cooperative. Felt it was OK to dc her to home. While preparing for dc pt developed acute Mental status change whole the pt was up at sink staff heard a loud thud and found pt on floor. Inc of urine and gasping for air.  She was emergently transferred to the ER once again from holding unit.  On arrival: hypotensive. Obtunded. Scr had increased from: 1.02 to 7.91. Bedside US showed distended IVC.  CT head negative. Was started on gentle IVFs and pressors started. PCCM asked to  admit.   Past Medical History  HTN, CVA   Significant Hospital Events   12/23: Brought to emergency room acutely psychotic with delusions and hallucinations.  Yelling the [employer]KK was going to blow up her house, was combative with police officers, required restraints.  Initially treated with IV Geodon and started on Zyprexa. 12/27: Still agitated and delirious.  Started on Depakote and Risperdal 12/28 mild improvement 1229 worse again, required decreased Haldol dosing due to prolonged QTC.  Cogentin started 12/31 was preparing for her discharge to home as clinically had improved.  Had syncopal episode while preparing for discharge at sink.  Following this was lethargic, minimally responsive, and hypotensive.  Evaluation in ER showed creatinine rising from 1.027 days ago to 7.91.  Ultra sound showed dilated inferior vena cava.  Central access placed by emergency room physician, critical care asked to evaluate for shock of unclear etiology 1/1: Stable, BP improving, weaning pressors   Consults:   Procedures:  Right femoral vein catheter 12/31  Significant Diagnostic Tests:  CT brain 12/31- for bleed  Micro Data:  12/31 blood culture x2 12/31 urine culture  Antimicrobials:  Cefepime   Interim history/subjective:  Low-grade temperatures overnight.  Blood pressure improved.  On low-dose vasopressors.  Nursing staff able to titrate down this morning.  Objective   Blood pressure (!) 156/94, pulse (!) 109, temperature (!) 100.8 F (38.2 C), resp. rate (!) 25, height 5\' 2"  (1.575 m), weight 66.7 kg, SpO2 100 %.        Intake/Output Summary (Last 24  hours) at 11/30/2018 [id number] Last data filed at 11/30/2018 0900 Gross per 24 hour  Intake 5236.86 ml  Output 410 ml  Net 4826.86 ml   Filed Weights   11/29/18 1945  Weight: 66.7 kg    Examination: General appearance: 63 y.o., female, NAD, she is conversant does appear confused seems to be talking to people in the room that is not there. Eyes:  anicteric sclerae, moist conjunctivae; pupils reactive, tracking appropriately HENT: NCAT; poor dentition Neck: Trachea midline; no JVD Lungs: CTAB, no crackles, no wheeze CV: Tachycardic, regular, S1, S2, no MRGs  Abdomen: Soft, non-tender; non-distended, BS present  Extremities: No peripheral edema, radial and DP pulses present bilaterally  Skin: Normal temperature, turgor and texture; no rash Psych: Alert to voice, looking around the room talking to people that are not there.  Likely has active hallucinations. Neuro: Alert and oriented to person and place, no focal deficit   Resolved Hospital Problem list     Assessment & Plan:   Shock, circulatory.  Overall unclear etiology suspect poor p.o. intake labs would suggest volume deplete.  There was concern for possible pulmonary embolism however would suspect patient to be more hypoxemic.  Unsure how to interpret the significantly elevated d-dimer however patient is on heparin.  Unable to obtain CTA due to acute renal failure. Plan Culture still pending Continue cefepime for the time being for coverage of potential underlying infection Continue IV fluids.  Made some changes to this today. We will give her a little sodium bicarb and fluids as her bicarb has been dropping slowly. Renal failure is improving. Echo pending to evaluate right ventricular function Lower extremity Dopplers pending IV heparin for now until we have a better idea regarding possibility of pulmonary embolism Wean norepinephrine and vasopressin to maintain mean arterial pressure greater than 65  Acute toxic/metabolic encephalopathy. Likely related to above including polypharmacy Plan The moment we are holding her antipsychotic medications. We will need to readdress this with our psych dietary colleagues. I have placed a page to the psychiatrist on-call EEG completed that was normal on 11/29/2018  Acute renal failure Plan IV hydration for acute renal failure,  suspect prerenal azotemia with component of ATN. CK normal. Fractional excretion of sodium 0.1% consistent with prerenal azotemia No current indication for dialysis. We will continue observe with frequent BMP.  Elevation in troponin. Likely related to hypotension and metabolic derangements.  Anion gap metabolic acidosis slowly resolving Likely related renal failure  Lactic acidosis secondary to above -We will continue to follow  Paranoia, Hallucinations - we will need advice from our psych colleagues from mood stabilizing drug standpoint.   Best practice:  Diet: NPO Pain/Anxiety/Delirium protocol (if indicated): na VAP protocol (if indicated): na DVT prophylaxis: IV heparin  GI prophylaxis: na  Glucose control: na Mobility: BR Code Status: full code  Family Communication: pending  Disposition: ICU   Labs   CBC: Recent Labs  Lab 11/29/18 1133 11/30/18 0313  WBC 8.3 12.0*  NEUTROABS 5.8  --   HGB 12.8 12.9  HCT 40.4 41.2  MCV 84.5 84.8  PLT 175 160    Basic Metabolic Panel: Recent Labs  Lab 11/29/18 1416 11/29/18 1905 11/30/18 0313  NA 141 140 136  K 3.7 4.3 4.8  CL 102 105 108  CO2 23 19* 15*  GLUCOSE 136* 238* 289*  BUN 63* 57* 51*  CREATININE 7.91* 6.77* 4.44*  CALCIUM 8.6* 7.8* 7.3*  MG 2.4 2.0 1.8  PHOS  --   --  4.8*   GFR: Estimated Creatinine Clearance: 11.5 mL/min (A) (by C-G formula based on SCr of 4.44 mg/dL (H)). Recent Labs  Lab 11/29/18 1133 11/29/18 1600 11/29/18 1905 11/30/18 0313  WBC 8.3  --   --  12.0*  LATICACIDVEN  --  2.9* 4.3*  --     Liver Function Tests: Recent Labs  Lab 11/29/18 1416  AST 25  ALT 14  ALKPHOS 51  BILITOT 1.1  PROT 6.9  ALBUMIN 3.5   Recent Labs  Lab 11/29/18 1905  LIPASE 35   Recent Labs  Lab 11/29/18 1604  AMMONIA 16    ABG    Component Value Date/Time   PHART 7.366 11/30/2018 0416   PCO2ART 31.3 (L) 11/30/2018 0416   PO2ART 136 (H) 11/30/2018 0416   HCO3 17.5 (L) 11/30/2018  0416   ACIDBASEDEF 6.4 (H) 11/30/2018 0416   O2SAT 98.3 11/30/2018 0416     Coagulation Profile: Recent Labs  Lab 11/29/18 1905  INR 1.35    Cardiac Enzymes: Recent Labs  Lab 11/30/18 0313  CKTOTAL 109  CKMB 10.3*    HbA1C: No results found for: HGBA1C  CBG: Recent Labs  Lab 11/29/18 1327  GLUCAP 94    This patient is critically ill with multiple organ system failure; which, requires frequent high complexity decision making, assessment, support, evaluation, and titration of therapies. This was completed through the application of advanced monitoring technologies and extensive interpretation of multiple databases. During this encounter critical care time was devoted to patient care services described in this note for 38 minutes.   Josephine IgoBradley L Baley Lorimer, DO [facility] 11/30/2018 9:29 AM  Personal pager: [phone] If unanswered, please page CCM On-call: [phone]

## 2018-11-30 NOTE — Progress Notes (Signed)
ANTICOAGULATION CONSULT NOTE - Follow Up Consult  Pharmacy Consult for Heparin Indication: pulmonary embolus  Allergies  Allergen Reactions  . Latex Hives    Patient Measurements: Height: 5\' 2"  (157.5 cm) Weight: 147 lb 0.8 oz (66.7 kg) IBW/kg (Calculated) : 50.1 Heparin Dosing Weight:   Vital Signs: Temp: 98.6 F (37 C) (01/01 0130) Temp Source: Axillary (12/31 1945) BP: 105/86 (01/01 0130) Pulse Rate: 118 (01/01 0130)  Labs: Recent Labs    11/29/18 1133 11/29/18 1416 11/29/18 1905 11/30/18 0046  HGB 12.8  --   --   --   HCT 40.4  --   --   --   PLT 175  --   --   --   APTT  --   --  169*  --   LABPROT  --   --  16.5*  --   INR  --   --  1.35  --   HEPARINUNFRC  --   --   --  1.18*  CREATININE  --  7.91* 6.77*  --     Estimated Creatinine Clearance: 7.5 mL/min (A) (by C-G formula based on SCr of 6.77 mg/dL (H)).   Medications:  Infusions:  . sodium chloride 125 mL/hr at 11/30/18 0257  . sodium chloride 500 mL (11/29/18 1900)  . ceFEPime (MAXIPIME) IV Stopped (11/29/18 2029)  . heparin 1,000 Units/hr (11/30/18 0328)  . norepinephrine (LEVOPHED) Adult infusion 31 mcg/min (11/30/18 0130)  . vasopressin (PITRESSIN) infusion - *FOR SHOCK* 0.04 Units/min (11/29/18 1659)    Assessment: Patient with high heparin level. Lab had to dilute to confirm level.  RN reports no issues the heparin drip.  Noted some bleeding at central line, but pressure dressing addressed and not further issues.  Goal of Therapy:  Heparin level 0.3-0.7 units/ml Monitor platelets by anticoagulation protocol: Yes   Plan:  Decrease heparin to 1000 units/hr Recheck level at 1200  13 South Water CourtGrimsley Jr, Jacquenette ShoneJulian Crowford 11/30/2018,3:33 AM

## 2018-11-30 NOTE — Progress Notes (Addendum)
eLink Physician-Brief Progress Note Patient Name: Stephanie Snyder DOB: Oct 24, 1956 MRN: [id number]   Date of Service  11/30/2018  HPI/Events of Note  Called d/t D-Dimer > 20.0.  Not sure how to interpret elevated D-Dimer is setting of  Shock and MSOF. BP = 105/86 and Sat = 97% on [place] O2. PE is in the DDX, however, she is already on a Heparin IV infusion per pharmacy.   eICU Interventions  Continue present management.      Intervention Category Intermediate Interventions: Diagnostic test evaluation  Tannar Broker Eugene 11/30/2018, 1:41 AM

## 2018-11-30 NOTE — BH Assessment (Signed)
[facility] Assessment Progress Note  At 09:12 this Clinical research associate called pt's nurse, Revonda Standard.  I informed her that pt had been at [facility] for about a week under IVC while we sought psychiatric placement for her, but that yesterday, 11/29/2018 she had been psychiatrically cleared and released from IVC.  I also informed her that pt has an intake appointment scheduled for outpatient psychiatry with Neila Gear, MD, on 12/08/2017 at 10:00.  Please see pt's AVS for the [facility] encounter.  I asked if appointment needs to be cancelled at this time, and it was decided that it should be kept for the time being.  Doylene Canning, [place] [facility] Coordinator [phone]

## 2018-12-01 ENCOUNTER — Inpatient Hospital Stay (HOSPITAL_COMMUNITY): Payer: Medicare Other

## 2018-12-01 DIAGNOSIS — I2699 Other pulmonary embolism without acute cor pulmonale: Secondary | ICD-10-CM

## 2018-12-01 LAB — GLUCOSE, CAPILLARY
Glucose-Capillary: 75 mg/dL (ref 70–99)
Glucose-Capillary: 82 mg/dL (ref 70–99)
Glucose-Capillary: 82 mg/dL (ref 70–99)
Glucose-Capillary: 93 mg/dL (ref 70–99)
Glucose-Capillary: 94 mg/dL (ref 70–99)

## 2018-12-01 LAB — BASIC METABOLIC PANEL
Anion gap: 8 (ref 5–15)
BUN: 34 mg/dL — ABNORMAL HIGH (ref 8–23)
CO2: 29 mmol/L (ref 22–32)
Calcium: 5.9 mg/dL — CL (ref 8.9–10.3)
Chloride: 101 mmol/L (ref 98–111)
Creatinine, Ser: 1.15 mg/dL — ABNORMAL HIGH (ref 0.44–1.00)
GFR calc Af Amer: 58 mL/min — ABNORMAL LOW (ref >60)
GFR calc non Af Amer: 50 mL/min — ABNORMAL LOW (ref >60)
Glucose, Bld: 182 mg/dL — ABNORMAL HIGH (ref 70–99)
POTASSIUM: 4.4 mmol/L (ref 3.5–5.1)
Sodium: 138 mmol/L (ref 135–145)

## 2018-12-01 LAB — LACTIC ACID, PLASMA: Lactic Acid, Venous: 1.5 mmol/L (ref 0.5–1.9)

## 2018-12-01 LAB — CBC
HCT: 33 % — ABNORMAL LOW (ref 36.0–46.0)
Hemoglobin: 10.7 g/dL — ABNORMAL LOW (ref 12.0–15.0)
MCH: 27.5 pg (ref 26.0–34.0)
MCHC: 32.4 g/dL (ref 30.0–36.0)
MCV: 84.8 fL (ref 80.0–100.0)
NRBC: 0 % (ref 0.0–0.2)
Platelets: 114 10*3/uL — ABNORMAL LOW (ref 150–400)
RBC: 3.89 MIL/uL (ref 3.87–5.11)
RDW: 13.3 % (ref 11.5–15.5)
WBC: 12.5 10*3/uL — ABNORMAL HIGH (ref 4.0–10.5)

## 2018-12-01 LAB — URINE CULTURE: CULTURE: NO GROWTH

## 2018-12-01 LAB — MAGNESIUM: Magnesium: 1.3 mg/dL — ABNORMAL LOW (ref 1.7–2.4)

## 2018-12-01 LAB — UREA NITROGEN, URINE: Urea Nitrogen, Ur: 191 mg/dL

## 2018-12-01 LAB — HEPARIN LEVEL (UNFRACTIONATED): Heparin Unfractionated: 0.44 IU/mL (ref 0.30–0.70)

## 2018-12-01 MED ORDER — VANCOMYCIN 50 MG/ML ORAL SOLUTION
125.0000 mg | Freq: Four times a day (QID) | ORAL | Status: DC
  Administered 2018-12-01 (×3): 125 mg via ORAL
  Filled 2018-12-01 (×6): qty 2.5

## 2018-12-01 MED ORDER — METRONIDAZOLE IN NACL 5-0.79 MG/ML-% IV SOLN: 500.0000 mg | Freq: Three times a day (TID) | INTRAVENOUS | Status: DC

## 2018-12-01 MED ORDER — CALCIUM GLUCONATE-NACL 1-0.675 GM/50ML-% IV SOLN
1.0000 g | Freq: Once | INTRAVENOUS | Status: AC
  Administered 2018-12-01: 1000 mg via INTRAVENOUS
  Filled 2018-12-01: qty 50

## 2018-12-01 MED ORDER — MAGNESIUM SULFATE 2 GM/50ML IV SOLN
2.0000 g | Freq: Once | INTRAVENOUS | Status: AC
  Administered 2018-12-01: 2 g via INTRAVENOUS
  Filled 2018-12-01: qty 50

## 2018-12-01 MED ORDER — INSULIN ASPART 100 UNIT/ML ~~LOC~~ SOLN: 2.0000 [IU] | SUBCUTANEOUS | Status: DC

## 2018-12-01 NOTE — Progress Notes (Signed)
Went in room to try and start an IV. Pt was unable to move any extremities or respond to commands. Pt was unable to stick out her tongue, she only made eye contact. NP came to bedside and ordered a stat CT. Will continue to monitor and update NP.

## 2018-12-01 NOTE — Progress Notes (Signed)
NAME:  Stephanie Snyder, MRN:  [id number], DOB:  1956-11-23, LOS: 2 ADMISSION DATE:  11/21/2018, CONSULTATION DATE:  12/31 REFERRING MD:  Mesner, CHIEF COMPLAINT:  Acute encephalopathy and circulatory shock   Brief History   63 year old female admitted 12/31 from psychiatric holding where she was being treated for acute psychosis felt possibly exacerbated by UTI since 12/24.  The symptoms had improved, she was to be discharged home.  While up at sink preparing for discharge she had a syncopal event, fell, was minimally responsive and hypotensive.  Sent to ER for further evaluation.  History of present illness   63 year old female who initially presented to the ER initially on 12/23 w/ acute psychosis reporting that "the [employer] was going to blow up her house". Police were called she was combative and needed to be restrained. She was transferred to the hospital for further evaluation. Her initial workup was negative for significant metabolic derangements w/ the exception of possible UTI and she was to be admitted w/ working dx of acute psychosis in setting of UTI. Treatment included: psychiatric eval, geodone, then started on zyprexa she was to be admitted to in-patient psych for titration of meds. On 12/27 she was started in Depakote & Risperdal. As of 12/28 she had finally been starting to improve. Was calmer, cooperative and not agitated.  On 12/29 haldol decreased d/t prolonged qtc. Cogentin started. On 12/31 she was appropriate, cooperative. Felt it was OK to dc her to home. While preparing for dc pt developed acute Mental status change whole the pt was up at sink staff heard a loud thud and found pt on floor. Inc of urine and gasping for air.  She was emergently transferred to the ER once again from holding unit.  On arrival: hypotensive. Obtunded. Scr had increased from: 1.02 to 7.91. Bedside US showed distended IVC.  CT head negative. Was started on gentle IVFs and pressors started. PCCM asked to  admit.   Past Medical History  HTN, CVA   Significant Hospital Events   12/23: Brought to emergency room acutely psychotic with delusions and hallucinations.  Yelling the [employer] was going to blow up her house, was combative with police officers, required restraints.  Initially treated with IV Geodon and started on Zyprexa. 12/27: Still agitated and delirious.  Started on Depakote and Risperdal 12/28 mild improvement 1229 worse again, required decreased Haldol dosing due to prolonged QTC.  Cogentin started 12/31 was preparing for her discharge to home as clinically had improved.  Had syncopal episode while preparing for discharge at sink.  Following this was lethargic, minimally responsive, and hypotensive.  Evaluation in ER showed creatinine rising from 1.027 days ago to 7.91.  Ultra sound showed dilated inferior vena cava.  Central access placed by emergency room physician, critical care asked to evaluate for shock of unclear etiology 1/1: Stable, BP improving, weaning pressors, with hypoxia, ECHO with suspected PE, U/S with bilateral LE DVT   Consults:   Procedures:  Right Femoral CVC 12/31 >>   Significant Diagnostic Tests:  CT Head/C-Spine 12/31 > 1. No evidence of significant acute traumatic injury to the skull, brain or cervical spine. Chronic microvascular ischemic changes in the cerebral white matter redemonstrated, as above. Multilevel degenerative disc disease and cervical spondylosis, as above. ECHO 1/1 > Left ventricle: The cavity size was normal. Wall thickness was normal. Systolic function was vigorous. The estimated ejection fraction was in the range of 65% to 70%. Wall motion was normal; there were  no regional wall motion abnormalities. Doppler parameters are consistent with abnormal left ventricular relaxation (grade 1 diastolic dysfunction). Ventricular septum: The contour showed diastolic flattening. Right ventricle: The cavity size was moderately dilated. Wall thickness was  normal. Systolic function was moderately reduced. Right atrium: The atrium was moderately dilated. Tricuspid valve: There was mild regurgitation. Pulmonary arteries: Systolic pressure was mildly increased. PA peak pressure: 43 mm Hg (S). - RV changes consistent with PE. Vas US 1/1 > Findings consistent with acute deep vein thrombosis involving the right peroneal vein, and right posterior tibial vein. No cystic structure found in the popliteal fossa. Left: Findings consistent with acute deep vein thrombosis involving the left posterior tibial vein, and left peroneal vein. No cystic structure found in the popliteal fossa.  Micro Data:  12/31 blood culture x2 > MRSA (One bottle) >>  12/31 urine culture  Antimicrobials:  Cefepime 12/31 >  Interim history/subjective:  Remains on vasopressin. PE/DVT positive. Mentation has improved.   Objective   Blood pressure (!) 99/58, pulse 88, temperature 99.9 F (37.7 C), resp. rate (!) 22, height 5\' 2"  (1.575 m), weight 70.8 kg, SpO2 96 %.        Intake/Output Summary (Last 24 hours) at 12/01/2018 0817 Last data filed at 12/01/2018 0600 Gross per 24 hour  Intake 984.58 ml  Output 1150 ml  Net -165.42 ml   Filed Weights   11/29/18 1945 12/01/18 0433  Weight: 66.7 kg 70.8 kg    Examination: General appearance: Adult female, no distress, lying in bed  HEENT: Dry MM  Lungs: Non-labored, clear breath sounds, no wheeze/crackles  CV: RRR, no MRG  Abdomen: non-tender, non-distended, active bowel sounds  Extremities: -edema  Skin: clean, dry, intact  Psych: Denies hallucinations. Neuro: Alert, oriented, follows commands   Review of Systems  Constitutional: Negative for chills and fever.  Cardiovascular: Negative for chest pain.  Gastrointestinal: Negative for nausea and vomiting.    Resolved Hospital Problem list     Assessment & Plan:   Shock, circulatory.  Likely hypovolemic due to poor po intake over several days   PE with bilateral DVT    Plan -Cardiac Monitoring, Vasopressin for MAP Goal >65 (Decrease to 0.03, Remains off Levophed)  -Repeat Blood Cultures, One bottle positive for MRSA, likely contaminate  -Continue cefepime -Continue IV heparin   Acute toxic/metabolic encephalopathy. Likely multifactorial in setting of Polypharmacy and shock state EEG completed that was normal on 11/29/2018 Paranoia, Hallucinations Plan -Psych Following -Continue to hold antipsychotic medications.  Acute renal failure in sitting of prerenal azotemia > Improving  CK normal Anion gap metabolic acidosis with lactic acidosis > Improving  Plan -Trend BMP -Replace electrolytes as indicated  -LA this AM   Best practice:  Diet: Advance diet as tolerated  DVT prophylaxis: IV heparin  Glucose control: Trend glucose/SSI  Mobility: BR Code Status: full code  Family Communication: no family at bedside  Disposition: ICU   Labs   CBC: Recent Labs  Lab 11/29/18 1133 11/30/18 0313  WBC 8.3 12.0*  NEUTROABS 5.8  --   HGB 12.8 12.9  HCT 40.4 41.2  MCV 84.5 84.8  PLT 175 160    Basic Metabolic Panel: Recent Labs  Lab 11/29/18 1416 11/29/18 1905 11/30/18 0313 11/30/18 1757  NA 141 140 136 137  K 3.7 4.3 4.8 4.1  CL 102 105 108 105  CO2 23 19* 15* 23  GLUCOSE 136* 238* 289* 180*  BUN 63* 57* 51* 41*  CREATININE 7.91* 6.77* 4.44*  1.99*  CALCIUM 8.6* 7.8* 7.3* 7.6*  MG 2.4 2.0 1.8  --   PHOS  --   --  4.8*  --    GFR: Estimated Creatinine Clearance: 26.3 mL/min (A) (by C-G formula based on SCr of 1.99 mg/dL (H)). Recent Labs  Lab 11/29/18 1133 11/29/18 1600 11/29/18 1905 11/30/18 0313 11/30/18 1236  WBC 8.3  --   --  12.0*  --   LATICACIDVEN  --  2.9* 4.3*  --  2.3*    Liver Function Tests: Recent Labs  Lab 11/29/18 1416  AST 25  ALT 14  ALKPHOS 51  BILITOT 1.1  PROT 6.9  ALBUMIN 3.5   Recent Labs  Lab 11/29/18 1905  LIPASE 35   Recent Labs  Lab 11/29/18 1604  AMMONIA 16    ABG    Component  Value Date/Time   PHART 7.366 11/30/2018 0416   PCO2ART 31.3 (L) 11/30/2018 0416   PO2ART 136 (H) 11/30/2018 0416   HCO3 17.5 (L) 11/30/2018 0416   ACIDBASEDEF 6.4 (H) 11/30/2018 0416   O2SAT 98.3 11/30/2018 0416     Coagulation Profile: Recent Labs  Lab 11/29/18 1905  INR 1.35    Cardiac Enzymes: Recent Labs  Lab 11/30/18 0313  CKTOTAL 109  CKMB 10.3*    HbA1C: No results found for: HGBA1C  CBG: Recent Labs  Lab 11/29/18 1327  GLUCAP 94    CCT: 45 minutes   Jovita Kussmaul, AGACNP-BC [facility]  Pgr: [phone]  PCCM Pgr: [phone]

## 2018-12-01 NOTE — Progress Notes (Signed)
CRITICAL VALUE ALERT  Critical Value:  Calcium 5.9   Date & Time Notied:  12/01/2018 1015  Provider Notified: Dr. Tonia Brooms  Orders Received/Actions taken: Waiting on further orders

## 2018-12-01 NOTE — Progress Notes (Signed)
ANTICOAGULATION CONSULT NOTE - Follow Up Consult  Pharmacy Consult for Heparin Indication: pulmonary embolus  Allergies  Allergen Reactions  . Latex Hives    Patient Measurements: Height: 5\' 2"  (157.5 cm) Weight: 147 lb 0.8 oz (66.7 kg) IBW/kg (Calculated) : 50.1 Heparin Dosing Weight:   Vital Signs: Temp: 100 F (37.8 C) (01/02 0000) Temp Source: Oral (01/02 0000) BP: 101/58 (01/02 0000) Pulse Rate: 93 (01/02 0000)  Labs: Recent Labs    11/29/18 1133  11/29/18 1905 11/30/18 0046 11/30/18 0313 11/30/18 1150 11/30/18 1757 11/30/18 2219  HGB 12.8  --   --   --  12.9  --   --   --   HCT 40.4  --   --   --  41.2  --   --   --   PLT 175  --   --   --  160  --   --   --   APTT  --   --  169*  --   --   --   --   --   LABPROT  --   --  16.5*  --   --   --   --   --   INR  --   --  1.35  --   --   --   --   --   HEPARINUNFRC  --   --   --  1.18*  --  1.44*  --  0.62  CREATININE  --    < > 6.77*  --  4.44*  --  1.99*  --   CKTOTAL  --   --   --   --  109  --   --   --   CKMB  --   --   --   --  10.3*  --   --   --    < > = values in this interval not displayed.    Estimated Creatinine Clearance: 25.6 mL/min (A) (by C-G formula based on SCr of 1.99 mg/dL (H)).   Medications:  Infusions:  . sodium chloride 10 mL/hr at 11/30/18 1754  . ceFEPime (MAXIPIME) IV Stopped (11/30/18 1904)  . heparin 600 Units/hr (11/30/18 1700)  . norepinephrine (LEVOPHED) Adult infusion Stopped (11/30/18 1500)  . vasopressin (PITRESSIN) infusion - *FOR SHOCK* 0.04 Units/min (11/30/18 1947)    Assessment: Patient with heparin level at goal.  No heparin issues noted.  Goal of Therapy:  Heparin level 0.3-0.7 units/ml Monitor platelets by anticoagulation protocol: Yes   Plan:  Continue heparin drip at current rate Recheck level at 0800  Darlina Guys, Jacquenette Shone Crowford 12/01/2018,1:11 AM

## 2018-12-01 NOTE — Progress Notes (Signed)
ANTICOAGULATION CONSULT NOTE   Pharmacy Consult for Heparin  Indication: pulmonary embolus  Allergies  Allergen Reactions  . Latex Hives   Patient Measurements: Height: 5\' 2"  (157.5 cm) Weight: 156 lb 1.4 oz (70.8 kg) IBW/kg (Calculated) : 50.1 Heparin Dosing Weight: last recorded weight September 2019 was 152lb, 70kg  Vital Signs: Temp: 99.9 F (37.7 C) (01/02 0600) Temp Source: Core (01/02 0400) BP: 99/58 (01/02 0600) Pulse Rate: 88 (01/02 0600)  Labs: Recent Labs    11/29/18 1133  11/29/18 1905  11/30/18 0313 11/30/18 1150 11/30/18 1757 11/30/18 2219 12/01/18 0758  HGB 12.8  --   --   --  12.9  --   --   --   --   HCT 40.4  --   --   --  41.2  --   --   --   --   PLT 175  --   --   --  160  --   --   --   --   APTT  --   --  169*  --   --   --   --   --   --   LABPROT  --   --  16.5*  --   --   --   --   --   --   INR  --   --  1.35  --   --   --   --   --   --   HEPARINUNFRC  --   --   --    < >  --  1.44*  --  0.62 0.44  CREATININE  --    < > 6.77*  --  4.44*  --  1.99*  --   --   CKTOTAL  --   --   --   --  109  --   --   --   --   CKMB  --   --   --   --  10.3*  --   --   --   --    < > = values in this interval not displayed.   Estimated Creatinine Clearance: 26.3 mL/min (A) (by C-G formula based on SCr of 1.99 mg/dL (H)).  Medical History: Past Medical History:  Diagnosis Date  . Arthritis   . Asthma   . CVA (cerebral vascular accident) (HCC) 2008  . Hypertension   . Irregular heart beat 2015   Medications:  Scheduled:  . Chlorhexidine Gluconate Cloth  6 each Topical Daily  . insulin aspart  2-6 Units Subcutaneous Q4H  . mouth rinse  15 mL Mouth Rinse BID  . sodium chloride flush  10-40 mL Intracatheter Q12H   Infusions:  . sodium chloride 10 mL/hr at 12/01/18 0853  . ceFEPime (MAXIPIME) IV Stopped (11/30/18 1904)  . heparin 600 Units/hr (12/01/18 0853)  . norepinephrine (LEVOPHED) Adult infusion Stopped (11/30/18 1500)  . vasopressin  (PITRESSIN) infusion - *FOR SHOCK* 0.03 Units/min (12/01/18 0853)    Assessment: Stephanie Snyder in TCU x 8 days, getting dressed for d/c 12/31 >> syncopal event >> floor, hypotensive. Head/Neck CT: neg for trauma, bleed. Begin Heparin for poss PE PMH: no seizure hx, was on Depakote/Haldol for psych issues   Heparin 3000 unit bolus, initial infusion at 1200 units/hr  0046 Hep level = 1.18, above desired range, Heparin infusion to 1000 units/hr  Very poor renal function, patient not clearing Heparin  Today, 12/01/2018  0800 Hep level =  0.44 on 600 units/hr  SCr greatly reduced to 1.99 Cl ~ 26 ml/mn  Goal of Therapy:  Heparin level 0.3-0.7 units/ml Monitor platelets by anticoagulation protocol: Yes   Plan:   Continue Heparin at 600 units/hr  Daily CBC, Heparin level  Anticipate oral anti-coag  Otho Bellows PharmD Pager [phone] 12/01/2018, 9:05 AM

## 2018-12-01 NOTE — Plan of Care (Signed)
[facility] to assume care 12/02/2018

## 2018-12-02 ENCOUNTER — Inpatient Hospital Stay (HOSPITAL_COMMUNITY): Payer: Medicare Other

## 2018-12-02 DIAGNOSIS — G929 Unspecified toxic encephalopathy: Secondary | ICD-10-CM

## 2018-12-02 DIAGNOSIS — R451 Restlessness and agitation: Secondary | ICD-10-CM

## 2018-12-02 DIAGNOSIS — F05 Delirium due to known physiological condition: Secondary | ICD-10-CM

## 2018-12-02 DIAGNOSIS — N179 Acute kidney failure, unspecified: Secondary | ICD-10-CM

## 2018-12-02 DIAGNOSIS — Z79899 Other long term (current) drug therapy: Secondary | ICD-10-CM

## 2018-12-02 DIAGNOSIS — I824Z3 Acute embolism and thrombosis of unspecified deep veins of distal lower extremity, bilateral: Secondary | ICD-10-CM

## 2018-12-02 DIAGNOSIS — G92 Toxic encephalopathy: Secondary | ICD-10-CM

## 2018-12-02 DIAGNOSIS — R571 Hypovolemic shock: Secondary | ICD-10-CM

## 2018-12-02 DIAGNOSIS — E876 Hypokalemia: Secondary | ICD-10-CM

## 2018-12-02 LAB — GLUCOSE, CAPILLARY
GLUCOSE-CAPILLARY: 66 mg/dL — AB (ref 70–99)
Glucose-Capillary: 65 mg/dL — ABNORMAL LOW (ref 70–99)
Glucose-Capillary: 68 mg/dL — ABNORMAL LOW (ref 70–99)
Glucose-Capillary: 65 mg/dL — ABNORMAL LOW (ref 70–99)
Glucose-Capillary: 99 mg/dL (ref 70–99)
Glucose-Capillary: 87 mg/dL (ref 70–99)
Glucose-Capillary: 112 mg/dL — ABNORMAL HIGH (ref 70–99)
Glucose-Capillary: 106 mg/dL — ABNORMAL HIGH (ref 70–99)

## 2018-12-02 LAB — HEPARIN LEVEL (UNFRACTIONATED)
Heparin Unfractionated: <0.1 IU/mL — ABNORMAL LOW (ref 0.30–0.70)
Heparin Unfractionated: 0.15 IU/mL — ABNORMAL LOW (ref 0.30–0.70)
Heparin Unfractionated: 0.24 [IU]/mL — ABNORMAL LOW (ref 0.30–0.70)

## 2018-12-02 LAB — CBC
HCT: 36.8 % (ref 36.0–46.0)
Hemoglobin: 11.5 g/dL — ABNORMAL LOW (ref 12.0–15.0)
MCH: 26.6 pg (ref 26.0–34.0)
MCHC: 31.3 g/dL (ref 30.0–36.0)
MCV: 85.2 fL (ref 80.0–100.0)
PLATELETS: 139 10*3/uL — AB (ref 150–400)
RBC: 4.32 MIL/uL (ref 3.87–5.11)
RDW: 13.2 % (ref 11.5–15.5)
WBC: 9.8 10*3/uL (ref 4.0–10.5)
nRBC: 0 % (ref 0.0–0.2)

## 2018-12-02 LAB — BASIC METABOLIC PANEL
Anion gap: 10 (ref 5–15)
BUN: 15 mg/dL (ref 8–23)
CO2: 27 mmol/L (ref 22–32)
Calcium: 7.8 mg/dL — ABNORMAL LOW (ref 8.9–10.3)
Chloride: 105 mmol/L (ref 98–111)
Creatinine, Ser: 0.73 mg/dL (ref 0.44–1.00)
GFR calc Af Amer: >60 mL/min (ref >60)
GFR calc non Af Amer: >60 mL/min (ref >60)
GLUCOSE: 91 mg/dL (ref 70–99)
Potassium: 3 mmol/L — ABNORMAL LOW (ref 3.5–5.1)
Sodium: 142 mmol/L (ref 135–145)

## 2018-12-02 LAB — MAGNESIUM: Magnesium: 2.2 mg/dL (ref 1.7–2.4)

## 2018-12-02 LAB — PHOSPHORUS: Phosphorus: 1.7 mg/dL — ABNORMAL LOW (ref 2.5–4.6)

## 2018-12-02 MED ORDER — IOPAMIDOL (ISOVUE-370) INJECTION 76%
100.0000 mL | Freq: Once | INTRAVENOUS | Status: AC | PRN
  Administered 2018-12-02: 100 mL via INTRAVENOUS

## 2018-12-02 MED ORDER — SODIUM CHLORIDE 0.9 % IV SOLN
1.0000 g | Freq: Three times a day (TID) | INTRAVENOUS | Status: DC
  Administered 2018-12-02 (×3): 1 g via INTRAVENOUS
  Filled 2018-12-02 (×6): qty 1

## 2018-12-02 MED ORDER — SODIUM CHLORIDE (PF) 0.9 % IJ SOLN
INTRAMUSCULAR | Status: AC
  Administered 2018-12-02: 09:00:00
  Filled 2018-12-02: qty 50

## 2018-12-02 MED ORDER — HEPARIN (PORCINE) 25000 UT/250ML-% IV SOLN
1100.0000 [IU]/h | INTRAVENOUS | Status: DC
  Administered 2018-12-02: 1000 [IU]/h via INTRAVENOUS
  Administered 2018-12-03: 1100 [IU]/h via INTRAVENOUS
  Filled 2018-12-02: qty 250

## 2018-12-02 MED ORDER — HEPARIN BOLUS VIA INFUSION
1900.0000 [IU] | Freq: Once | INTRAVENOUS | Status: AC
  Administered 2018-12-02: 1900 [IU] via INTRAVENOUS
  Filled 2018-12-02: qty 1900

## 2018-12-02 MED ORDER — HALOPERIDOL LACTATE 5 MG/ML IJ SOLN: 1.0000 mg | Freq: Four times a day (QID) | INTRAMUSCULAR | Status: DC | PRN

## 2018-12-02 MED ORDER — POTASSIUM CHLORIDE CRYS ER 20 MEQ PO TBCR
40.0000 meq | EXTENDED_RELEASE_TABLET | Freq: Two times a day (BID) | ORAL | Status: AC
  Administered 2018-12-02 (×2): 40 meq via ORAL
  Filled 2018-12-02 (×2): qty 2

## 2018-12-02 MED ORDER — HEPARIN (PORCINE) 25000 UT/250ML-% IV SOLN
800.0000 [IU]/h | INTRAVENOUS | Status: DC
  Filled 2018-12-02: qty 250

## 2018-12-02 MED ORDER — HEPARIN BOLUS VIA INFUSION
4000.0000 [IU] | Freq: Once | INTRAVENOUS | Status: AC
  Administered 2018-12-02: 4000 [IU] via INTRAVENOUS
  Filled 2018-12-02: qty 4000

## 2018-12-02 MED ORDER — HEPARIN (PORCINE) 25000 UT/250ML-% IV SOLN
850.0000 [IU]/h | INTRAVENOUS | Status: DC
  Administered 2018-12-02: 850 [IU]/h via INTRAVENOUS

## 2018-12-02 MED ORDER — IOPAMIDOL (ISOVUE-370) INJECTION 76%
INTRAVENOUS | Status: AC
  Administered 2018-12-02: 09:00:00
  Filled 2018-12-02: qty 100

## 2018-12-02 NOTE — Progress Notes (Addendum)
Hypoglycemic Event  CBG: 65  Treatment: Soda Sprite 4oz  Symptoms: None  Follow-up CBG: Time: 0812 CBG Result: 99  Possible Reasons for Event:   Comments/MD notified: Dr Gwenlyn SaranFeliz    Yidel Teuscher, Addison NaegelieKina L

## 2018-12-02 NOTE — Care Management Important Message (Signed)
Important Message  Patient Details  Name: Stephanie Snyder MRN: [id number] Date of Birth: Jun 07, 1956   Medicare Important Message Given:  Yes    Caren Macadam 12/02/2018, 11:08 AMImportant Message  Patient Details  Name: Stephanie Snyder MRN: [id number] Date of Birth: Jul 26, 1956   Medicare Important Message Given:  Yes    Caren Macadam 12/02/2018, 11:08 AM

## 2018-12-02 NOTE — Progress Notes (Signed)
Pharmacy Brief Note - Renal Dose Adjustment:  Renal function improving. SCr 0.73, CrCl ~ 65 mL/min.   Current antibiotic order: cefepime 1 g IV q24h  Will adjust to new dose: cefepime 1 g IV q8h  Cindi Carbon, PharmD 12/02/18 7:34 AM

## 2018-12-02 NOTE — Progress Notes (Signed)
ANTICOAGULATION CONSULT NOTE   Pharmacy Consult for Heparin  Indication: pulmonary embolus with bilateral DVT  Allergies  Allergen Reactions  . Latex Hives   Patient Measurements: Height: 5\' 2"  (157.5 cm) Weight: 154 lb 8.7 oz (70.1 kg) IBW/kg (Calculated) : 50.1 Heparin Dosing Weight: 63.8 kg  Vital Signs: Temp: 99 F (37.2 C) (01/03 1411) Temp Source: Oral (01/03 1411) BP: 162/90 (01/03 1411) Pulse Rate: 107 (01/03 1411)  Labs: Recent Labs    11/29/18 1905  11/30/18 0313  11/30/18 1757  12/01/18 0758 12/01/18 0856 12/02/18 0309 12/02/18 0612 12/02/18 1349  HGB  --    < > 12.9  --   --   --   --  10.7* 11.5*  --   --   HCT  --   --  41.2  --   --   --   --  33.0* 36.8  --   --   PLT  --   --  160  --   --   --   --  114* 139*  --   --   APTT 169*  --   --   --   --   --   --   --   --   --   --   LABPROT 16.5*  --   --   --   --   --   --   --   --   --   --   INR 1.35  --   --   --   --   --   --   --   --   --   --   HEPARINUNFRC  --    < >  --    < >  --    < > 0.44  --  <0.10*  --  0.15*  CREATININE 6.77*  --  4.44*  --  1.99*  --   --  1.15*  --  0.73  --   CKTOTAL  --   --  109  --   --   --   --   --   --   --   --   CKMB  --   --  10.3*  --   --   --   --   --   --   --   --    < > = values in this interval not displayed.   Estimated Creatinine Clearance: 65.2 mL/min (by C-G formula based on SCr of 0.73 mg/dL).  Medications:  Scheduled:  . potassium chloride  40 mEq Oral BID   Infusions:  . sodium chloride Stopped (12/01/18 1406)  . ceFEPime (MAXIPIME) IV Stopped (12/02/18 0837)  . heparin      Assessment: 62 yoF in TCU x 8 days, getting dressed for d/c 12/31 >> syncopal event >> floor, hypotensive. Head/Neck CT: neg for trauma, bleed. Begin Heparin for poss PE  PMH: no seizure hx, was on Depakote/Haldol for psych issues  Today, 12/02/18  HL higher but still subtherapeutic on 850 units/hr. Confirmed with RN that heparin is infusing at correct  rate, no interruptions in infusion during CTA scan, and no signs of bleeding.   CBC: Hgb/Plt slightly low but stable, improved from yesterday  Renal function continues to improve, CrCl ~65 mL/min   Goal of Therapy:  Heparin level 0.3-0.7 units/ml Monitor platelets by anticoagulation protocol: Yes   Plan:   Heparin 4000 units bolus x 1  Increase heparin infusion to 1000 units/hr (note patient was previously supratherapeutic at this rate)  Check HL in 6 hours  Daily CBC, Heparin level  Follow for conversion to oral anticoagualtion  Bernadene Personrew Dona Klemann, PharmD, BCPS [phone] 12/02/2018, 3:46 PM

## 2018-12-02 NOTE — Progress Notes (Addendum)
ANTICOAGULATION CONSULT NOTE   Pharmacy Consult for Heparin  Indication: pulmonary embolus with bilateral DVT  Allergies  Allergen Reactions  . Latex Hives   Patient Measurements: Height: 5\' 2"  (157.5 cm) Weight: 156 lb 1.4 oz (70.8 kg) IBW/kg (Calculated) : 50.1 Heparin Dosing Weight: 63.8 kg  Vital Signs: Temp: 99 F (37.2 C) (01/03 0649) Temp Source: Core (01/03 0400) BP: 132/62 (01/03 0600) Pulse Rate: 87 (01/03 0649)  Labs: Recent Labs    11/29/18 1905  11/30/18 0313  11/30/18 1757 11/30/18 2219 12/01/18 0758 12/01/18 0856 12/02/18 0309 12/02/18 0612  HGB  --   --  12.9  --   --   --   --  10.7* 11.5*  --   HCT  --   --  41.2  --   --   --   --  33.0* 36.8  --   PLT  --   --  160  --   --   --   --  114* 139*  --   APTT 169*  --   --   --   --   --   --   --   --   --   LABPROT 16.5*  --   --   --   --   --   --   --   --   --   INR 1.35  --   --   --   --   --   --   --   --   --   HEPARINUNFRC  --    < >  --    < >  --  0.62 0.44  --  <0.10*  --   CREATININE 6.77*  --  4.44*  --  1.99*  --   --  1.15*  --  0.73  CKTOTAL  --   --  109  --   --   --   --   --   --   --   CKMB  --   --  10.3*  --   --   --   --   --   --   --    < > = values in this interval not displayed.   Estimated Creatinine Clearance: 65.5 mL/min (by C-G formula based on SCr of 0.73 mg/dL).  Medical History: Past Medical History:  Diagnosis Date  . Arthritis   . Asthma   . CVA (cerebral vascular accident) (HCC) 2008  . Hypertension   . Irregular heart beat 2015   Medications:  Scheduled:  . Chlorhexidine Gluconate Cloth  6 each Topical Daily  . heparin  1,900 Units Intravenous Once  . insulin aspart  2-6 Units Subcutaneous Q4H  . mouth rinse  15 mL Mouth Rinse BID  . sodium chloride flush  10-40 mL Intracatheter Q12H  . vancomycin  125 mg Oral QID   Infusions:  . sodium chloride Stopped (12/01/18 1406)  . ceFEPime (MAXIPIME) IV Stopped (12/01/18 1809)  . heparin       Assessment: 62 yoF in TCU x 8 days, getting dressed for d/c 12/31 >> syncopal event >> floor, hypotensive. Head/Neck CT: neg for trauma, bleed. Begin Heparin for poss PE  PMH: no seizure hx, was on Depakote/Haldol for psych issues  Today, 12/02/18  HL < 0.10 is undetectable this morning on heparin infusion of 600 units/hr. Confirmed with RN that heparin is infusing at correct rate, no interruptions in infusion, and no  signs of bleeding.   CBC: Hgb/Plt slightly low but stable, improved from yesterday  Renal function continues to improve, CrCl ~65 mL/min   Goal of Therapy:  Heparin level 0.3-0.7 units/ml Monitor platelets by anticoagulation protocol: Yes   Plan:   Heparin bolus of 1900 units (30 mg/kg) x 1  Increase heparin infusion to 850 units/hr  Check HL in 6 hours  Daily CBC, Heparin level  Follow for conversion to oral anticoagualtion  Cindi CarbonMary M Letizia Hook, PharmD 12/02/18 7:25 AM

## 2018-12-02 NOTE — Progress Notes (Addendum)
[facility] PROGRESS NOTE    Progress Note  Stephanie Snyder  ZOX:[id number] DOB: [birth year] DOA: 11/21/2018 PCP: Claudean Severance, MD     Brief Narrative:   Stephanie Snyder is an 63 y.o. female past medical history of psychiatric disorders admitted on 12/31 to the psychiatric holding area and being treated for acute psychosis felt to be exacerbated by UTI on 12/24, had a syncopal episode and becoming unresponsive and hypotensive, she was obtunded (with a baseline creatinine of less than 1) after his syncopal episode her basic metabolic panel was checked and showed a creatinine of 7, CT scan of the head was negative she was given gentle IV fluids and pressors PCCM was consulted  1/1: Stable, BP improving, weaning pressors, with hypoxia, ECHO with suspected PE, U/S with bilateral LE DVT. CT Head/C-Spine 12/31 > 1. No evidence of significant acute traumatic injury to the skull, brain or cervical spine.  ECHO 1/1 > Left ventricle: The cavity size was normal. Wall thickness was normal. Systolic function was vigorous. The estimated ejection fraction was in the range of 65% to 70%. Wall motion was norma, grade 1 diastolic dysfunction. Right ventricle: The cavity size was moderately dilated.Systolic function was moderately reduced. Right atrium: The atrium was moderately dilated. Tricuspid valve: There was mild regurgitation. Pulmonary arteries: Systolic pressure was mildly increased. PA peak pressure: 43 mm Hg. Vas [place] 1/1 > Findings consistent with acute bilateral lower extremity DVT. CT Angie of the chest on 12/02/2018: Shows submassive PE with right heart strain.  Assessment/Plan:   Hypovolemic shock (HCC): Likely due to hypovolemia in the setting of decreased oral intake for several days. She was fluid resuscitated, she is now off pressors. Blood cultures 1/2 coag negative staph BCID MRSA likely a contaminant. Currently on IV cefepime.  Acute PE with acute bilateral DVT: On IV  heparin.  Creatinine is now at baseline.  CT Angio of the chest: Submassive PE, 2D echo showed normal systolic function right ventricle was moderately dilated and systolic function moderately reduced with right atrium dilation. Regarding continues to improve she is no longer short of breath she is satting 94 to 92% on room air, her blood pressure is stable.  Acute confusional state/acute psychosis (HCC)/toxic metabolic encephalopathy: Likely multifactorial in the setting of polypharmacy and shock state. EEG completed on 11/29/2018 that was normal. Psych has been consulted ecology recommended to hold Depakote, Haldol 5 mg and Cogentin.  They recommended to restart her home medications, which I do not see any in her medication list. We will reconsult psychiatry. Use Haldol IV for agitation.  Acute kidney injury: Likely prerenal azotemia resolved with fluid resuscitation and pressors.  Anion gap metabolic acidosis: Likely due to hypovolemia now resolved.   DVT prophylaxis: IV heparin Family Communication:**none Disposition Plan/Barrier to D/C: home soon Code Status:     Code Status Orders  (From admission, onward)         Start     Ordered   11/29/18 1539  Full code  Continuous     11/29/18 1542        Code Status History    This patient has a current code status but no historical code status.        IV Access:    Peripheral IV   Procedures and diagnostic studies:   Ct Head Wo Contrast  Result Date: 12/01/2018 CLINICAL DATA:  Encephalopathy, altered mental status. EXAM: CT HEAD WITHOUT CONTRAST TECHNIQUE: Contiguous axial images were obtained from the base of the skull  through the vertex without intravenous contrast. COMPARISON:  CT scan of November 29, 2018. FINDINGS: Brain: Mild chronic ischemic white matter disease is noted. No mass effect or midline shift is noted. Ventricular size is within normal limits. There is no evidence of mass lesion, hemorrhage or acute  infarction. Vascular: No hyperdense vessel or unexpected calcification. Skull: Normal. Negative for fracture or focal lesion. Sinuses/Orbits: No acute finding. Other: None. IMPRESSION: Mild chronic ischemic white matter disease. No acute intracranial abnormality seen. Electronically Signed   By: Lupita Raider, M.D.   On: 12/01/2018 14:49   Vas [place] Lower Extremity Venous (dvt)  Result Date: 12/01/2018  Lower Venous Study Indications: Pain, and Swelling.  Limitations: Body habitus, line and bandages. Comparison Study: No prior study available Performing Technologist: Melodie Bouillon  Examination Guidelines: A complete evaluation includes B-mode imaging, spectral Doppler, color Doppler, and power Doppler as needed of all accessible portions of each vessel. Bilateral testing is considered an integral part of a complete examination. Limited examinations for reoccurring indications may be performed as noted.  Right Venous Findings: +---------+---------------+---------+-----------+----------+-------------------+          CompressibilityPhasicitySpontaneityPropertiesSummary             +---------+---------------+---------+-----------+----------+-------------------+ CFV      Full           Yes      Yes                  very pulsatile flow +---------+---------------+---------+-----------+----------+-------------------+ SFJ      Full                                                             +---------+---------------+---------+-----------+----------+-------------------+ FV Prox  Full                    Yes                  very poor flow                                                            detected            +---------+---------------+---------+-----------+----------+-------------------+ FV Mid   Full                                                             +---------+---------------+---------+-----------+----------+-------------------+ FV DistalFull                                          very poor flow,  nearly absent       +---------+---------------+---------+-----------+----------+-------------------+ PFV      Full                                                             +---------+---------------+---------+-----------+----------+-------------------+ POP      Full           No       Yes                  very poor flow,                                                           nearly absent       +---------+---------------+---------+-----------+----------+-------------------+ PTV      None                    No                   Acute               +---------+---------------+---------+-----------+----------+-------------------+ PERO     None                    No                   Acute               +---------+---------------+---------+-----------+----------+-------------------+ GSV      Full                                                             +---------+---------------+---------+-----------+----------+-------------------+  Left Venous Findings: +---------+---------------+---------+-----------+----------+-------------------+          CompressibilityPhasicitySpontaneityPropertiesSummary             +---------+---------------+---------+-----------+----------+-------------------+ CFV      Full           Yes      Yes                                      +---------+---------------+---------+-----------+----------+-------------------+ SFJ      Full                                                             +---------+---------------+---------+-----------+----------+-------------------+ FV Prox  Full                                                             +---------+---------------+---------+-----------+----------+-------------------+  FV Mid   Full                                                              +---------+---------------+---------+-----------+----------+-------------------+ FV DistalFull                                                             +---------+---------------+---------+-----------+----------+-------------------+ PFV      Full                                                             +---------+---------------+---------+-----------+----------+-------------------+ POP      Full                    Yes                  very poor flow,                                                           nearly absent       +---------+---------------+---------+-----------+----------+-------------------+ PTV      None                    No                   Acute               +---------+---------------+---------+-----------+----------+-------------------+ PERO     None                    No                   Acute               +---------+---------------+---------+-----------+----------+-------------------+    Summary: Right: Findings consistent with acute deep vein thrombosis involving the right peroneal vein, and right posterior tibial vein. No cystic structure found in the popliteal fossa. Left: Findings consistent with acute deep vein thrombosis involving the left posterior tibial vein, and left peroneal vein. No cystic structure found in the popliteal fossa.  *See table(s) above for measurements and observations. Electronically signed by Gretta Beganodd Early MD on 12/01/2018 at 5:50:31 PM.    Final      Medical Consultants:    None.  Anti-Infectives:   IV cefepime  Subjective:    Stephanie Snyder she is pleasant this morning, slightly disoriented on time because she was questioning how many days that she has been in the hospital.  She relates she is on no psychiatric medications at home.  Objective:    Vitals:   12/02/18 0413 12/02/18 0500 12/02/18 0600 12/02/18 0649  BP:  (!) 106/56 132/62   Pulse: 94 96 91 87  Resp: 17 17 16 18   Temp:  99.5 F  (37.5 C) 99.3 F (37.4 C) 99 F (37.2 C)  TempSrc:      SpO2: 91% 96% 95% 95%  Weight:      Height:        Intake/Output Summary (Last 24 hours) at 12/02/2018 0730 Last data filed at 12/02/2018 0400 Gross per 24 hour  Intake 705.31 ml  Output 2775 ml  Net -2069.69 ml   Filed Weights   11/29/18 1945 12/01/18 0433  Weight: 66.7 kg 70.8 kg    Exam: General exam: In no acute distress. Respiratory system: Good air movement and clear to auscultation. Cardiovascular system: S1 & S2 heard, RRR. No JVD. Gastrointestinal system: Abdomen is nondistended, soft and nontender.  Central nervous system: Alert and oriented. No focal neurological deficits. Extremities: No pedal edema. Skin: No rashes, lesions or ulcers Psychiatry: Judgement and insight appear normal. Mood & affect appropriate.    Data Reviewed:    Labs: Basic Metabolic Panel: Recent Labs  Lab 11/29/18 1416 11/29/18 1905 11/30/18 0313 11/30/18 1757 12/01/18 0856 12/02/18 0612  NA 141 140 136 137 138 142  K 3.7 4.3 4.8 4.1 4.4 3.0*  CL 102 105 108 105 101 105  CO2 23 19* 15* 23 29 27   GLUCOSE 136* 238* 289* 180* 182* 91  BUN 63* 57* 51* 41* 34* 15  CREATININE 7.91* 6.77* 4.44* 1.99* 1.15* 0.73  CALCIUM 8.6* 7.8* 7.3* 7.6* 5.9* 7.8*  MG 2.4 2.0 1.8  --  1.3* 2.2  PHOS  --   --  4.8*  --   --  1.7*   GFR Estimated Creatinine Clearance: 65.5 mL/min (by C-G formula based on SCr of 0.73 mg/dL). Liver Function Tests: Recent Labs  Lab 11/29/18 1416  AST 25  ALT 14  ALKPHOS 51  BILITOT 1.1  PROT 6.9  ALBUMIN 3.5   Recent Labs  Lab 11/29/18 1905  LIPASE 35   Recent Labs  Lab 11/29/18 1604  AMMONIA 16   Coagulation profile Recent Labs  Lab 11/29/18 1905  INR 1.35    CBC: Recent Labs  Lab 11/29/18 1133 11/30/18 0313 12/01/18 0856 12/02/18 0309  WBC 8.3 12.0* 12.5* 9.8  NEUTROABS 5.8  --   --   --   HGB 12.8 12.9 10.7* 11.5*  HCT 40.4 41.2 33.0* 36.8  MCV 84.5 84.8 84.8 85.2  PLT 175 160  114* 139*   Cardiac Enzymes: Recent Labs  Lab 11/30/18 0313  CKTOTAL 109  CKMB 10.3*   BNP (last 3 results) No results for input(s): PROBNP in the last 8760 hours. CBG: Recent Labs  Lab 12/01/18 1200 12/01/18 1811 12/01/18 1959 12/01/18 2341 12/02/18 0323  GLUCAP 82 94 82 75 68*   D-Dimer: Recent Labs    11/29/18 1905  DDIMER >20.00*   Hgb A1c: No results for input(s): HGBA1C in the last 72 hours. Lipid Profile: No results for input(s): CHOL, HDL, LDLCALC, TRIG, CHOLHDL, LDLDIRECT in the last 72 hours. Thyroid function studies: No results for input(s): TSH, T4TOTAL, T3FREE, THYROIDAB in the last 72 hours.  Invalid input(s): FREET3 Anemia work up: No results for input(s): VITAMINB12, FOLATE, FERRITIN, TIBC, IRON, RETICCTPCT in the last 72 hours. Sepsis Labs: Recent Labs  Lab 11/29/18 1133 11/29/18 1600 11/29/18 1905 11/30/18 0313 11/30/18 1236 12/01/18 0856 12/02/18 0309  WBC 8.3  --   --  12.0*  --  12.5* 9.8  LATICACIDVEN  --  2.9* 4.3*  --  2.3* 1.5  --  Microbiology Recent Results (from the past 240 hour(s))  Gastrointestinal Panel by PCR , Stool     Status: None   Collection Time: 11/29/18  2:16 PM  Result Value Ref Range Status   Campylobacter species NOT DETECTED NOT DETECTED Final   Plesimonas shigelloides NOT DETECTED NOT DETECTED Final   Salmonella species NOT DETECTED NOT DETECTED Final   Yersinia enterocolitica NOT DETECTED NOT DETECTED Final   Vibrio species NOT DETECTED NOT DETECTED Final   Vibrio cholerae NOT DETECTED NOT DETECTED Final   Enteroaggregative E coli (EAEC) NOT DETECTED NOT DETECTED Final   Enteropathogenic E coli (EPEC) NOT DETECTED NOT DETECTED Final   Enterotoxigenic E coli (ETEC) NOT DETECTED NOT DETECTED Final   Shiga like toxin producing E coli (STEC) NOT DETECTED NOT DETECTED Final   Shigella/Enteroinvasive E coli (EIEC) NOT DETECTED NOT DETECTED Final   Cryptosporidium NOT DETECTED NOT DETECTED Final   Cyclospora  cayetanensis NOT DETECTED NOT DETECTED Final   Entamoeba histolytica NOT DETECTED NOT DETECTED Final   Giardia lamblia NOT DETECTED NOT DETECTED Final   Adenovirus F40/41 NOT DETECTED NOT DETECTED Final   Astrovirus NOT DETECTED NOT DETECTED Final   Norovirus GI/GII NOT DETECTED NOT DETECTED Final   Rotavirus A NOT DETECTED NOT DETECTED Final   Sapovirus (I, II, IV, and V) NOT DETECTED NOT DETECTED Final    Comment: Performed at [facility], [address] [place], [place] [address]  Culture, blood (routine x 2)     Status: None (Preliminary result)   Collection Time: 11/29/18  2:17 PM  Result Value Ref Range Status   Specimen Description   Final    BLOOD RIGHT ANTECUBITAL Performed at [facility], [address] [place], [place] [address]    Special Requests   Final    BOTTLES DRAWN AEROBIC ONLY Blood Culture adequate volume Performed at [facility], [address] [place], [place] [address]    Culture   Final    NO GROWTH 3 DAYS Performed at [facility], [address] [place], [place] [address]    Report Status PENDING  Incomplete  Culture, Urine     Status: None   Collection Time: 11/29/18  3:44 PM  Result Value Ref Range Status   Specimen Description   Final    URINE, CATHETERIZED Performed at [facility], [address] [place], [place] [address]    Special Requests   Final    NONE Performed at [facility], [address] [place], [place] [address]    Culture   Final    NO GROWTH Performed at [facility], [address] [place], [place] [address]    Report Status 12/01/2018 FINAL  Final  Culture, blood (routine x 2)     Status: Abnormal (Preliminary result)   Collection Time: 11/29/18  4:00 PM  Result Value Ref Range Status   Specimen Description   Final    BLOOD CENTRAL LINE Performed at [facility], [address] [place] [address]    Special Requests   Final    BOTTLES DRAWN AEROBIC AND ANAEROBIC Blood Culture adequate volume Performed at [facility], [address] [place], [place] [address]    Culture  Setup Time   Final    GRAM POSITIVE COCCI IN BOTH AEROBIC AND ANAEROBIC BOTTLES CRITICAL RESULT CALLED TO, READ BACK BY AND VERIFIED WITH: Damaris HippoM. LILLISTON, PHARMD ([facility]) AT  1515 ON 11/30/18 BY C. JESSUP, MLT.    Culture (A)  Final    STAPHYLOCOCCUS SPECIES (COAGULASE NEGATIVE) THE SIGNIFICANCE OF ISOLATING THIS ORGANISM FROM A SINGLE SET OF BLOOD CULTURES WHEN MULTIPLE SETS ARE DRAWN IS UNCERTAIN. PLEASE NOTIFY THE MICROBIOLOGY DEPARTMENT WITHIN ONE WEEK IF SPECIATION AND SENSITIVITIES ARE REQUIRED. Performed at [facility], [address], [place], [place] [address]    Report Status PENDING  Incomplete  Blood Culture ID Panel (Reflexed)     Status: Abnormal   Collection Time: 11/29/18  4:00 PM  Result Value Ref Range Status   Enterococcus species NOT DETECTED NOT DETECTED Final   Listeria monocytogenes NOT DETECTED NOT DETECTED Final   Staphylococcus species DETECTED (A) NOT DETECTED Final    Comment: Methicillin (oxacillin) resistant coagulase negative staphylococcus. Possible blood culture contaminant (unless isolated from more than one blood culture draw or clinical case suggests pathogenicity). No antibiotic treatment is indicated for blood  culture contaminants. CRITICAL RESULT CALLED TO, READ BACK BY AND VERIFIED WITH: Damaris Hippo, PHARMD AT 1515 ON 11/30/18 BY C. JESSUP, MLT.    Staphylococcus aureus (BCID) NOT DETECTED NOT DETECTED Final   Methicillin resistance DETECTED (A) NOT DETECTED Final    Comment: CRITICAL RESULT CALLED TO, READ BACK BY AND VERIFIED WITH: M. LILLISTON, PHARMD ([facility]) AT 1515 ON 11/30/18 BY C. JESSUP, MLT.    Streptococcus species NOT DETECTED NOT DETECTED Final   Streptococcus agalactiae NOT DETECTED NOT DETECTED Final   Streptococcus pneumoniae NOT  DETECTED NOT DETECTED Final   Streptococcus pyogenes NOT DETECTED NOT DETECTED Final   Acinetobacter baumannii NOT DETECTED NOT DETECTED Final   Enterobacteriaceae species NOT DETECTED NOT DETECTED Final   Enterobacter cloacae complex NOT DETECTED NOT DETECTED Final   Escherichia coli NOT DETECTED NOT DETECTED Final   Klebsiella oxytoca NOT DETECTED NOT DETECTED Final   Klebsiella pneumoniae NOT DETECTED NOT DETECTED Final   Proteus species NOT DETECTED NOT DETECTED Final   Serratia marcescens NOT DETECTED NOT DETECTED Final   Haemophilus influenzae NOT DETECTED NOT DETECTED Final   Neisseria meningitidis NOT DETECTED NOT DETECTED Final   Pseudomonas aeruginosa NOT DETECTED NOT DETECTED Final   Candida albicans NOT DETECTED NOT DETECTED Final   Candida glabrata NOT DETECTED NOT DETECTED Final   Candida krusei NOT DETECTED NOT DETECTED Final   Candida parapsilosis NOT DETECTED NOT DETECTED Final   Candida tropicalis NOT DETECTED NOT DETECTED Final    Comment: Performed at [facility], [address], [place], [place] [address]  MRSA PCR Screening     Status: None   Collection Time: 11/29/18  6:45 PM  Result Value Ref Range Status   MRSA by PCR NEGATIVE NEGATIVE Final    Comment:        The GeneXpert MRSA Assay (FDA approved for NASAL specimens only), is one component of a comprehensive MRSA colonization surveillance program. It is not intended to diagnose MRSA infection nor to guide or monitor treatment for MRSA infections. Performed at [facility], [address], [place], [place] [address]   Culture, blood (routine x 2)     Status: None (Preliminary result)   Collection Time: 12/01/18 10:52 AM  Result Value Ref Range Status   Specimen Description   Final    BLOOD LEFT ARM Performed at [facility], [address], [place], [place] [address]    Special Requests   Final    BOTTLES DRAWN AEROBIC ONLY Blood Culture adequate  volume Performed at [facility], [address] [place], [place] [address]    Culture   Final    NO GROWTH < 24 HOURS Performed at [facility], [address] [place], [place] [address]    Report Status PENDING  Incomplete  Culture, blood (routine x 2)     Status: None (Preliminary result)   Collection Time: 12/01/18 10:52 AM  Result Value Ref Range Status   Specimen Description   Final    BLOOD LEFT HAND Performed at [facility], [address], [place], [place] [address]    Special Requests   Final    BOTTLES DRAWN AEROBIC ONLY Blood Culture adequate volume Performed at [facility], [address], [place], [place] [address]    Culture   Final    NO GROWTH < 24 HOURS Performed at [facility], [address] [place], [place] [address]    Report Status PENDING  Incomplete     Medications:   . Chlorhexidine Gluconate Cloth  6 each Topical Daily  . heparin  1,900 Units Intravenous Once  . insulin aspart  2-6 Units Subcutaneous Q4H  . mouth rinse  15 mL Mouth Rinse BID  . sodium chloride flush  10-40 mL Intracatheter Q12H  . vancomycin  125 mg Oral QID   Continuous Infusions: . sodium chloride Stopped (12/01/18 1406)  . ceFEPime (MAXIPIME) IV Stopped (12/01/18 1809)  . heparin        LOS: 3 days   Marinda Elk  [facility]   *Please refer to [email or web address], password [email or web address] to get updated schedule on who will round on this patient, as hospitalists switch teams weekly. If 7PM-7AM, please contact night-coverage at [email or web address], password [email or web address] for any overnight needs.  12/02/2018, 7:30 AM

## 2018-12-02 NOTE — Progress Notes (Signed)
ANTICOAGULATION CONSULT NOTE - Follow Up Consult  Pharmacy Consult for Heparin Indication: pulmonary embolus with bilateral DVT  Allergies  Allergen Reactions  . Latex Hives    Patient Measurements: Height: 5\' 2"  (157.5 cm) Weight: 154 lb 8.7 oz (70.1 kg) IBW/kg (Calculated) : 50.1 Heparin Dosing Weight:   Vital Signs: Temp: 99 F (37.2 C) (01/03 1411) Temp Source: Oral (01/03 1411) BP: 162/90 (01/03 1411) Pulse Rate: 107 (01/03 1411)  Labs: Recent Labs    11/30/18 0313  11/30/18 1757  12/01/18 0856 12/02/18 0309 12/02/18 0612 12/02/18 1349 12/02/18 2216  HGB 12.9  --   --   --  10.7* 11.5*  --   --   --   HCT 41.2  --   --   --  33.0* 36.8  --   --   --   PLT 160  --   --   --  114* 139*  --   --   --   HEPARINUNFRC  --    < >  --    < >  --  <0.10*  --  0.15* 0.24*  CREATININE 4.44*  --  1.99*  --  1.15*  --  0.73  --   --   CKTOTAL 109  --   --   --   --   --   --   --   --   CKMB 10.3*  --   --   --   --   --   --   --   --    < > = values in this interval not displayed.    Estimated Creatinine Clearance: 65.2 mL/min (by C-G formula based on SCr of 0.73 mg/dL).   Medications:  Infusions:  . sodium chloride Stopped (12/01/18 1406)  . ceFEPime (MAXIPIME) IV 1 g (12/02/18 2155)  . heparin 1,000 Units/hr (12/02/18 1601)    Assessment: Patient with low heparin level.  No heparin issues per RN.  Goal of Therapy:  Heparin level 0.3-0.7 units/ml Monitor platelets by anticoagulation protocol: Yes   Plan:  Increase heparin to 1100 units/hr Recheck level at 0800  Aleene Davidson Crowford 12/02/2018,11:12 PM

## 2018-12-02 NOTE — Progress Notes (Signed)
12/02/18  0900 Patient had large liquid/mucous stool. Collected stool and Notified MD to see if he wanted to send stool for C-diff. Per MD do not send stool. Stool not sent.

## 2018-12-03 LAB — BASIC METABOLIC PANEL
Anion gap: 9 (ref 5–15)
BUN: 8 mg/dL (ref 8–23)
CO2: 28 mmol/L (ref 22–32)
Calcium: 8.2 mg/dL — ABNORMAL LOW (ref 8.9–10.3)
Chloride: 110 mmol/L (ref 98–111)
Creatinine, Ser: 0.7 mg/dL (ref 0.44–1.00)
GFR calc Af Amer: >60 mL/min (ref >60)
GFR calc non Af Amer: >60 mL/min (ref >60)
Glucose, Bld: 95 mg/dL (ref 70–99)
Potassium: 3.4 mmol/L — ABNORMAL LOW (ref 3.5–5.1)
Sodium: 147 mmol/L — ABNORMAL HIGH (ref 135–145)

## 2018-12-03 LAB — CBC
HCT: 39.2 % (ref 36.0–46.0)
HEMOGLOBIN: 12.1 g/dL (ref 12.0–15.0)
MCH: 26.1 pg (ref 26.0–34.0)
MCHC: 30.9 g/dL (ref 30.0–36.0)
MCV: 84.5 fL (ref 80.0–100.0)
Platelets: 209 10*3/uL (ref 150–400)
RBC: 4.64 MIL/uL (ref 3.87–5.11)
RDW: 13.2 % (ref 11.5–15.5)
WBC: 8.8 10*3/uL (ref 4.0–10.5)
nRBC: 0 % (ref 0.0–0.2)

## 2018-12-03 LAB — HEPARIN LEVEL (UNFRACTIONATED)
Heparin Unfractionated: 0.25 IU/mL — ABNORMAL LOW (ref 0.30–0.70)
Heparin Unfractionated: 0.66 IU/mL (ref 0.30–0.70)
Heparin Unfractionated: 0.43 IU/mL (ref 0.30–0.70)

## 2018-12-03 MED ORDER — POTASSIUM CHLORIDE CRYS ER 20 MEQ PO TBCR
40.0000 meq | EXTENDED_RELEASE_TABLET | Freq: Two times a day (BID) | ORAL | Status: AC
  Administered 2018-12-03 (×2): 40 meq via ORAL
  Filled 2018-12-03 (×2): qty 2

## 2018-12-03 MED ORDER — HEPARIN (PORCINE) 25000 UT/250ML-% IV SOLN: 1350.0000 [IU]/h | INTRAVENOUS | Status: DC

## 2018-12-03 MED ORDER — HEPARIN (PORCINE) 25000 UT/250ML-% IV SOLN
1300.0000 [IU]/h | INTRAVENOUS | Status: DC
  Administered 2018-12-04: 1300 [IU]/h via INTRAVENOUS
  Filled 2018-12-03: qty 250

## 2018-12-03 MED ORDER — HEPARIN BOLUS VIA INFUSION
3000.0000 [IU] | Freq: Once | INTRAVENOUS | Status: AC
  Administered 2018-12-03: 3000 [IU] via INTRAVENOUS
  Filled 2018-12-03: qty 3000

## 2018-12-03 MED ORDER — HYDRALAZINE HCL 20 MG/ML IJ SOLN
10.0000 mg | Freq: Four times a day (QID) | INTRAMUSCULAR | Status: DC | PRN
  Administered 2018-12-03 – 2018-12-04 (×2): 10 mg via INTRAVENOUS
  Filled 2018-12-03 (×2): qty 1

## 2018-12-03 NOTE — Progress Notes (Addendum)
ANTICOAGULATION CONSULT NOTE   Pharmacy Consult for Heparin  Indication: pulmonary embolus with bilateral DVT  Allergies  Allergen Reactions  . Latex Hives   Patient Measurements: Height: 5\' 2"  (157.5 cm) Weight: 154 lb 8.7 oz (70.1 kg) IBW/kg (Calculated) : 50.1 Heparin Dosing Weight: 63.8 kg  Vital Signs: Temp: 100 F (37.8 C) (01/04 0359) Temp Source: Oral (01/04 0359) BP: 157/85 (01/04 0359) Pulse Rate: 100 (01/04 0359)  Labs: Recent Labs    12/01/18 0856 12/02/18 0309 12/02/18 0612 12/02/18 1349 12/02/18 2216 12/03/18 0512 12/03/18 0800  HGB 10.7* 11.5*  --   --   --  12.1  --   HCT 33.0* 36.8  --   --   --  39.2  --   PLT 114* 139*  --   --   --  209  --   HEPARINUNFRC  --  <0.10*  --  0.15* 0.24*  --  0.25*  CREATININE 1.15*  --  0.73  --   --  0.70  --    Estimated Creatinine Clearance: 65.2 mL/min (by C-G formula based on SCr of 0.7 mg/dL).  Medications:  Scheduled:   Infusions:  . sodium chloride 250 mL (12/03/18 0558)  . ceFEPime (MAXIPIME) IV Stopped (12/02/18 2226)  . heparin 1,100 Units/hr (12/03/18 0751)    Assessment: 62 yoF in TCU x 8 days, getting dressed for d/c 12/31 >> syncopal event >> floor, hypotensive. Head/Neck CT: neg for trauma, bleed. Begin Heparin for poss PE  PMH: no seizure hx, was on Depakote/Haldol for psych issues  Today, 12/03/18  HL higher but still subtherapeutic on 1100 units/hr. Confirmed with RN that heparin is infusing at correct rate, no significant pauses in infusion  CBC: Hgb/Plt improved to WNL  AKI resolved (SCr 6.8 >> 0.7)   Goal of Therapy:  Heparin level 0.3-0.7 units/ml Monitor platelets by anticoagulation protocol: Yes   Plan:   Heparin 3000 units bolus x 1 given intermediate risk PE  Increase heparin infusion to 1350 units/hr  Check HL in 6 hours  Daily CBC, Heparin level  Follow for conversion to oral anticoagualtion  Bernadene Person, PharmD, BCPS [phone] 12/03/2018, 9:02  AM  ADDENDUM  1600 HL now borderline high at 0.66 on 1350 units/hr  No bleeding or infusion issues per RN   Given that patient has been hard to keep therapeutic and was previously supratherapeutic on lower infusion rates, will drop infusion back to 1300 units/hr and recheck level in 6 hrs  Bernadene Person, PharmD, BCPS [phone] 12/03/2018, 4:56 PM

## 2018-12-03 NOTE — Consult Note (Signed)
[facility] Face-to-Face Psychiatry Consult   Reason for Consult:  Confusion  Referring Physician:  Dr Darin Engels Patient Identification: Stephanie Snyder MRN:  [id number] Principal Diagnosis: Acute psychosis [facility]) Diagnosis:  Principal Problem:   Acute psychosis (HCC) Active Problems:   Hypovolemic shock (HCC)   Toxic encephalopathy   DVT, lower extremity, distal, acute, bilateral (HCC)   AKI (acute kidney injury) (HCC)   Total Time spent with patient: 45 minutes  Subjective:   Stephanie Snyder is a 63 y.o. female patient does not warrant a psychiatric admission.  This provider saw her in the ED and this is the best she has been cognitively since admission.  She is no longer paranoid.  She does ramble and appears to be having memory issues.  Recommend a neurology consult for a dementia work up.  No psychiatric medications warranted at this time, Dr Lucianne Muss reviewed this patient and concurs with the plan.  HPI:  63 yo female who presented to the ED with paranoia and a UTI.  Rocephin given for the UTI but paranoia continued.  Multiple medical tests were negative at the time, stabilized with haldol.  No past psychiatric history.  Unfortunately, she had a PE upon discharge.  She is better and cognitively much improved since the ED.  Stephanie Snyder does ramble and continues to talk about the past but no suicidal/homicidal ideations, hallucinations, paranoia, or substance abuse.  She lives with her boyfriend and they have been having financial issues but did not want to ask anyone for help but realizes now, she needs to.  Continues to wander in the conversation but threat to herself.  Due to her current clear cognition, especially compared to the ED, no psychiatric medications recommended at this time.  Past Psychiatric History: psychosis  Risk to Self: Suicidal Ideation: No Suicidal Intent: No Is patient at risk for suicide?: No Suicidal Plan?: No Access to Means: No What has been your use of drugs/alcohol  within the last 12 months?: (none) How many times?: (none) Other Self Harm Risks: (none) Triggers for Past Attempts: None known Intentional Self Injurious Behavior: None Risk to Others: Homicidal Ideation: No Thoughts of Harm to Others: No Current Homicidal Intent: No Current Homicidal Plan: No Access to Homicidal Means: No Identified Victim: none History of harm to others?: No Assessment of Violence: None Noted Violent Behavior Description: (recent agressive behaviors for 3 days) Does patient have access to weapons?: No Criminal Charges Pending?: No Does patient have a court date: No Prior Inpatient Therapy: Prior Inpatient Therapy: No Prior Outpatient Therapy: Prior Outpatient Therapy: No Does patient have an ACCT team?: No Does patient have Intensive In-House Services?  : No Does patient have [facility] services? : No Does patient have P4CC services?: No  Past Medical History:  Past Medical History:  Diagnosis Date  . Arthritis   . Asthma   . CVA (cerebral vascular accident) (HCC) 2008  . Hypertension   . Irregular heart beat 2015    Past Surgical History:  Procedure Laterality Date  . TUBAL LIGATION  1984   Family History:  Family History  Problem Relation Age of Onset  . Cancer Father    Family Psychiatric  History: none Social History:  Social History   Substance and Sexual Activity  Alcohol Use No     Social History   Substance and Sexual Activity  Drug Use No    Social History   Socioeconomic History  . Marital status: Married    Spouse name: Not on file  .  Number of children: Not on file  . Years of education: Not on file  . Highest education level: Not on file  Occupational History  . Not on file  Social Needs  . Financial resource strain: Not on file  . Food insecurity:    Worry: Not on file    Inability: Not on file  . Transportation needs:    Medical: Not on file    Non-medical: Not on file  Tobacco Use  . Smoking status: Never Smoker   . Smokeless tobacco: Never Used  Substance and Sexual Activity  . Alcohol use: No  . Drug use: No  . Sexual activity: Not on file  Lifestyle  . Physical activity:    Days per week: Not on file    Minutes per session: Not on file  . Stress: Not on file  Relationships  . Social connections:    Talks on phone: Not on file    Gets together: Not on file    Attends religious service: Not on file    Active member of club or organization: Not on file    Attends meetings of clubs or organizations: Not on file    Relationship status: Not on file  Other Topics Concern  . Not on file  Social History Narrative  . Not on file   Additional Social History:    Allergies:   Allergies  Allergen Reactions  . Latex Hives    Labs:  Results for orders placed or performed during the hospital encounter of 11/21/18 (from the past 48 hour(s))  Glucose, capillary     Status: Abnormal   Collection Time: 12/01/18  5:30 PM  Result Value Ref Range   Glucose-Capillary 65 (L) 70 - 99 mg/dL  Glucose, capillary     Status: None   Collection Time: 12/01/18  6:11 PM  Result Value Ref Range   Glucose-Capillary 94 70 - 99 mg/dL   Comment 1 Notify RN    Comment 2 Document in Chart   Glucose, capillary     Status: None   Collection Time: 12/01/18  7:59 PM  Result Value Ref Range   Glucose-Capillary 82 70 - 99 mg/dL   Comment 1 Notify RN    Comment 2 Document in Chart   Glucose, capillary     Status: None   Collection Time: 12/01/18 11:41 PM  Result Value Ref Range   Glucose-Capillary 75 70 - 99 mg/dL   Comment 1 Notify RN    Comment 2 Document in Chart   CBC     Status: Abnormal   Collection Time: 12/02/18  3:09 AM  Result Value Ref Range   WBC 9.8 4.0 - 10.5 K/uL   RBC 4.32 3.87 - 5.11 MIL/uL   Hemoglobin 11.5 (L) 12.0 - 15.0 g/dL   HCT 16.136.8 09.636.0 - 04.546.0 %   MCV 85.2 80.0 - 100.0 fL   MCH 26.6 26.0 - 34.0 pg   MCHC 31.3 30.0 - 36.0 g/dL   RDW 40.913.2 81.111.5 - 91.415.5 %   Platelets 139 (L) 150 -  400 K/uL   nRBC 0.0 0.0 - 0.2 %    Comment: Performed at [facility], [address] [place], [place] [address]  Heparin level (unfractionated)     Status: Abnormal   Collection Time: 12/02/18  3:09 AM  Result Value Ref Range   Heparin Unfractionated <0.10 (L) 0.30 - 0.70 IU/mL    Comment: (NOTE) If heparin results are below expected values, and  patient dosage has  been confirmed, suggest follow up testing of antithrombin III levels. Performed at [facility], [address], [place], [place] [address]   Glucose, capillary     Status: Abnormal   Collection Time: 12/02/18  3:23 AM  Result Value Ref Range   Glucose-Capillary 68 (L) 70 - 99 mg/dL   Comment 1 Notify RN    Comment 2 Document in Chart   Basic metabolic panel     Status: Abnormal   Collection Time: 12/02/18  6:12 AM  Result Value Ref Range   Sodium 142 135 - 145 mmol/L   Potassium 3.0 (L) 3.5 - 5.1 mmol/L    Comment: DELTA CHECK NOTED REPEATED TO VERIFY    Chloride 105 98 - 111 mmol/L   CO2 27 22 - 32 mmol/L   Glucose, Bld 91 70 - 99 mg/dL   BUN 15 8 - 23 mg/dL   Creatinine, Ser 6.04 0.44 - 1.00 mg/dL   Calcium 7.8 (L) 8.9 - 10.3 mg/dL   GFR calc non Af Amer >60 >60 mL/min   GFR calc Af Amer >60 >60 mL/min   Anion gap 10 5 - 15    Comment: Performed at [facility], [address] [place], [place] [address]  Magnesium     Status: None   Collection Time: 12/02/18  6:12 AM  Result Value Ref Range   Magnesium 2.2 1.7 - 2.4 mg/dL    Comment: Performed at [facility], [address] [place], [place] [address]  Phosphorus     Status: Abnormal   Collection Time: 12/02/18  6:12 AM  Result Value Ref Range   Phosphorus 1.7 (L) 2.5 - 4.6 mg/dL    Comment: Performed at [facility], [address] [place], [place] [address]  Glucose, capillary     Status: Abnormal   Collection Time: 12/02/18  7:31 AM  Result Value Ref  Range   Glucose-Capillary 65 (L) 70 - 99 mg/dL  Glucose, capillary     Status: None   Collection Time: 12/02/18  8:12 AM  Result Value Ref Range   Glucose-Capillary 99 70 - 99 mg/dL  Glucose, capillary     Status: Abnormal   Collection Time: 12/02/18 10:14 AM  Result Value Ref Range   Glucose-Capillary 66 (L) 70 - 99 mg/dL  Glucose, capillary     Status: None   Collection Time: 12/02/18 10:54 AM  Result Value Ref Range   Glucose-Capillary 87 70 - 99 mg/dL  Glucose, capillary     Status: Abnormal   Collection Time: 12/02/18 12:26 PM  Result Value Ref Range   Glucose-Capillary 112 (H) 70 - 99 mg/dL  Heparin level (unfractionated)     Status: Abnormal   Collection Time: 12/02/18  1:49 PM  Result Value Ref Range   Heparin Unfractionated 0.15 (L) 0.30 - 0.70 IU/mL    Comment: (NOTE) If heparin results are below expected values, and patient dosage has  been confirmed, suggest follow up testing of antithrombin III levels. Performed at [facility], [address], [place], [place] [address]   Glucose, capillary     Status: Abnormal   Collection Time: 12/02/18  5:04 PM  Result Value Ref Range   Glucose-Capillary 106 (H) 70 - 99 mg/dL  Heparin level (unfractionated)     Status: Abnormal   Collection Time: 12/02/18 10:16 PM  Result Value Ref Range   Heparin Unfractionated 0.24 (L) 0.30 - 0.70  IU/mL    Comment: (NOTE) If heparin results are below expected values, and patient dosage has  been confirmed, suggest follow up testing of antithrombin III levels. Performed at [facility], [address] [place], [place] [address]   CBC     Status: None   Collection Time: 12/03/18  5:12 AM  Result Value Ref Range   WBC 8.8 4.0 - 10.5 K/uL   RBC 4.64 3.87 - 5.11 MIL/uL   Hemoglobin 12.1 12.0 - 15.0 g/dL   HCT 60.439.2 54.036.0 - 98.146.0 %   MCV 84.5 80.0 - 100.0 fL   MCH 26.1 26.0 - 34.0 pg   MCHC 30.9 30.0 - 36.0 g/dL   RDW 19.113.2 47.811.5 - 29.515.5 %   Platelets  209 150 - 400 K/uL   nRBC 0.0 0.0 - 0.2 %    Comment: Performed at [facility], [address] [place], [place] [address]  Basic metabolic panel     Status: Abnormal   Collection Time: 12/03/18  5:12 AM  Result Value Ref Range   Sodium 147 (H) 135 - 145 mmol/L   Potassium 3.4 (L) 3.5 - 5.1 mmol/L   Chloride 110 98 - 111 mmol/L   CO2 28 22 - 32 mmol/L   Glucose, Bld 95 70 - 99 mg/dL   BUN 8 8 - 23 mg/dL   Creatinine, Ser 8.650.70 0.44 - 1.00 mg/dL   Calcium 8.2 (L) 8.9 - 10.3 mg/dL   GFR calc non Af Amer >60 >60 mL/min   GFR calc Af Amer >60 >60 mL/min   Anion gap 9 5 - 15    Comment: Performed at [facility], [address] [place], [place] [address]  Heparin level (unfractionated)     Status: Abnormal   Collection Time: 12/03/18  8:00 AM  Result Value Ref Range   Heparin Unfractionated 0.25 (L) 0.30 - 0.70 IU/mL    Comment: (NOTE) If heparin results are below expected values, and patient dosage has  been confirmed, suggest follow up testing of antithrombin III levels. Performed at [facility], [address] [place], [place] [address]     Current Facility-Administered Medications  Medication Dose Route Frequency Provider Last Rate Last Dose  . 0.9 %  sodium chloride infusion   Intravenous PRN Marinda ElkFeliz Ortiz, Abraham, MD 10 mL/hr at 12/03/18 0558 250 mL at 12/03/18 0558  . acetaminophen (TYLENOL) tablet 500 mg  500 mg Oral Q6H PRN Marinda ElkFeliz Ortiz, Abraham, MD   500 mg at 12/01/18 0432  . haloperidol lactate (HALDOL) injection 1 mg  1 mg Intravenous Q6H PRN Marinda ElkFeliz Ortiz, Abraham, MD      . heparin ADULT infusion 100 units/mL (25000 units/23550mL sodium chloride 0.45%)  1,350 Units/hr Intravenous Continuous Danford BadWofford, Drew A, RPH 13.5 mL/hr at 12/03/18 1005 1,350 Units/hr at 12/03/18 1005  . ondansetron (ZOFRAN) injection 4 mg  4 mg Intravenous Q6H PRN Marinda ElkFeliz Ortiz, Abraham, MD   4 mg at 11/29/18 2208  . potassium chloride SA  (K-DUR,KLOR-CON) CR tablet 40 mEq  40 mEq Oral BID Marinda ElkFeliz Ortiz, Abraham, MD   40 mEq at 12/03/18 1149    Musculoskeletal: Strength & Muscle Tone: within normal limits Gait & Station: normal Patient leans: N/A  Psychiatric Specialty Exam: Physical Exam  Nursing note and vitals reviewed. Constitutional: She is oriented to person, place, and time. She appears well-developed and well-nourished.  HENT:  Head: Normocephalic.  Neck: Normal range of motion.  Respiratory: Effort normal.  Musculoskeletal: Normal range of motion.  Neurological: She is alert and oriented to person, place, and time.  Psychiatric: Her speech is normal and behavior is normal. Judgment and thought content normal. Her mood appears anxious. Her affect is blunt. Cognition and memory are impaired.    Review of Systems  Psychiatric/Behavioral: Positive for memory loss.  All other systems reviewed and are negative.   Blood pressure (!) 157/85, pulse 100, temperature 100 F (37.8 C), temperature source Oral, resp. rate 18, height 5\' 2"  (1.575 m), weight 70.1 kg, SpO2 94 %.Body mass index is 28.27 kg/m.  General Appearance: Casual  Eye Contact:  Good  Speech:  Normal Rate  Volume:  Normal  Mood:  Anxious  Affect:  Blunt  Thought Process:  Coherent and Descriptions of Associations: Circumstantial  Orientation:  Other:  person and place  Thought Content:  Logical  Suicidal Thoughts:  No  Homicidal Thoughts:  No  Memory:  Immediate;   Fair Recent;   Fair Remote;   Fair  Judgement:  Fair  Insight:  Fair  Psychomotor Activity:  Normal  Concentration:  Concentration: Fair and Attention Span: Fair  Recall:  [employer] of Knowledge:  Fair  Language:  Good  Akathisia:  No  Handed:  Right  AIMS (if indicated):     Assets:  Housing Intimacy Leisure Time Resilience Social Support  ADL's:  Intact  Cognition:  Impaired,  Mild  Sleep:        Treatment Plan Summary: Confusion, recommend neurology consult for  dementia -No medication recommended at this time  Disposition: No evidence of imminent risk to self or others at present.    Nanine Means, NP 12/03/2018 12:26 PM

## 2018-12-03 NOTE — Progress Notes (Addendum)
[facility] PROGRESS NOTE    Progress Note  Stephanie Snyder  VWU:[id number] DOB: [birth year] DOA: 11/21/2018 PCP: Claudean Severance, MD     Brief Narrative:   Stephanie Snyder is an 63 y.o. female past medical history of psychiatric disorders admitted on 12/31 to the psychiatric holding area and being treated for acute psychosis felt to be exacerbated by UTI on 12/24, had a syncopal episode and becoming unresponsive and hypotensive, she was obtunded (with a baseline creatinine of less than 1) after his syncopal episode her basic metabolic panel was checked and showed a creatinine of 7, CT scan of the head was negative she was given gentle IV fluids and pressors PCCM was consulted  1/1: Stable, BP improving, weaning pressors, with hypoxia, ECHO with suspected PE, U/S with bilateral LE DVT. CT Head/C-Spine 12/31 > 1. No evidence of significant acute traumatic injury to the skull, brain or cervical spine.  ECHO 1/1 > Left ventricle: The cavity size was normal. Wall thickness was normal. Systolic function was vigorous. The estimated ejection fraction was in the range of 65% to 70%. Wall motion was norma, grade 1 diastolic dysfunction. Right ventricle: The cavity size was moderately dilated.Systolic function was moderately reduced. Right atrium: The atrium was moderately dilated. Tricuspid valve: There was mild regurgitation. Pulmonary arteries: Systolic pressure was mildly increased. PA peak pressure: 43 mm Hg. Vas [place] 1/1 > Findings consistent with acute bilateral lower extremity DVT. CT Angie of the chest on 12/02/2018: Shows submassive PE with right heart strain.  Assessment/Plan:   Hypovolemic shock (HCC): - Likely due to hypovolemia in the setting of decreased oral intake for several days. - She was fluid resuscitated, she is now off pressors. - Blood cultures 1/2 coag negative staph BCID MRSA likely a contaminant. - Currently afebrile with no leukocytosis will discontinue IV  cefepime.  Acute PE with acute bilateral DVT: - On IV heparin.   - CT Angio of the chest: Submassive PE. - 2D echo showed normal systolic function right ventricle was moderately dilated and systolic function moderately reduced with right atrium dilation. - Vitals are stable, she needs a mamogram, pap smear and colonoscopy as an outpatient. Consult CS for PCP.  Acute confusional state/acute psychosis (HCC)/toxic metabolic encephalopathy: Likely multifactorial in the setting of polypharmacy and shock state. EEG completed on 11/29/2018 that was normal. having paranoids thoughts, she is no psychiatric medications at home. re consult psyq.   Acute kidney injury: Likely prerenal azotemia resolved with fluid resuscitation and pressors.  Anion gap metabolic acidosis: Likely due to hypovolemia now resolved.     DVT prophylaxis: IV heparin Family Communication:**none Disposition Plan/Barrier to D/C: unable to detemrine Code Status:     Code Status Orders  (From admission, onward)         Start     Ordered   11/29/18 1539  Full code  Continuous     11/29/18 1542        Code Status History    This patient has a current code status but no historical code status.        IV Access:    Peripheral IV   Procedures and diagnostic studies:   Ct Head Wo Contrast  Result Date: 12/01/2018 CLINICAL DATA:  Encephalopathy, altered mental status. EXAM: CT HEAD WITHOUT CONTRAST TECHNIQUE: Contiguous axial images were obtained from the base of the skull through the vertex without intravenous contrast. COMPARISON:  CT scan of November 29, 2018. FINDINGS: Brain: Mild chronic ischemic white matter disease is  noted. No mass effect or midline shift is noted. Ventricular size is within normal limits. There is no evidence of mass lesion, hemorrhage or acute infarction. Vascular: No hyperdense vessel or unexpected calcification. Skull: Normal. Negative for fracture or focal lesion.  Sinuses/Orbits: No acute finding. Other: None. IMPRESSION: Mild chronic ischemic white matter disease. No acute intracranial abnormality seen. Electronically Signed   By: Lupita RaiderJames  Green Jr, M.D.   On: 12/01/2018 14:49   Ct Angio Chest Pe W Or Wo Contrast  Result Date: 12/02/2018 CLINICAL DATA:  Chest pain and shortness of breath. EXAM: CT ANGIOGRAPHY CHEST WITH CONTRAST TECHNIQUE: Multidetector CT imaging of the chest was performed using the standard protocol during bolus administration of intravenous contrast. Multiplanar CT image reconstructions and MIPs were obtained to evaluate the vascular anatomy. CONTRAST:  100mL ISOVUE-370 IOPAMIDOL (ISOVUE-370) INJECTION 76% COMPARISON:  Chest x-ray 11/29/2018 FINDINGS: Cardiovascular: The heart is mildly enlarged. Evidence of significant right heart strain with markedly abnormal RV LV ratio. The aorta is normal in caliber. No dissection. No atherosclerotic calcifications. The branch vessels are patent. No definite coronary artery calcifications. The pulmonary arterial tree is fairly well opacified. There is sub massive bilateral pulmonary emboli with saddle emboli in the main right pulmonary artery. This is causing significant right heart strain with a dilated right ventricle and mass effect on the left ventricle. Mediastinum/Nodes: No mediastinal or hilar mass or adenopathy. The esophagus is grossly normal. Lungs/Pleura: There are moderate-sized bilateral pleural effusions with overlying atelectasis. No obvious pulmonary infarcts. Upper Abdomen: The upper abdomen is unremarkable. Musculoskeletal: No significant bony findings. Review of the MIP images confirms the above findings. IMPRESSION: 1. Positive for acute sub massive PE with CT evidence of right heart strain (RV/LV Ratio = 1.6) consistent with at least submassive (intermediate risk) PE. The presence of right heart strain has been associated with an increased risk of morbidity and mortality. Please activate Code PE  by paging [phone]. 2. Bilateral pleural effusions and bibasilar atelectasis. 3. Normal thoracic aorta. These results were called by telephone at the time of interpretation on 12/02/2018 at 9:39 am to Dr. Marinda ElkABRAHAM FELIZ ORTIZ , who verbally acknowledged these results. Electronically Signed   By: Rudie MeyerP.  Gallerani M.D.   On: 12/02/2018 09:40     Medical Consultants:    None.  Anti-Infectives:   IV cefepime  Subjective:    Sung Amabileharlotte Brecht she is pleasant this morning,with paranoid thoughts  Objective:    Vitals:   12/02/18 1014 12/02/18 1411 12/02/18 2103 12/03/18 0359  BP: (!) 143/81 (!) 162/90 (!) 155/89 (!) 157/85  Pulse: 90 (!) 107 100 100  Resp: 18 17 18 18   Temp: 98.8 F (37.1 C) 99 F (37.2 C) 98.5 F (36.9 C) 100 F (37.8 C)  TempSrc: Oral Oral Oral Oral  SpO2: 90% 95% 98% 94%  Weight:      Height:        Intake/Output Summary (Last 24 hours) at 12/03/2018 1020 Last data filed at 12/03/2018 0400 Gross per 24 hour  Intake 897 ml  Output 1100 ml  Net -203 ml   Filed Weights   11/29/18 1945 12/01/18 0433 12/02/18 1007  Weight: 66.7 kg 70.8 kg 70.1 kg    Exam: General exam: In no acute distress. Respiratory system: Good air movement and clear to auscultation. Cardiovascular system: S1 & S2 heard, RRR. No JVD. Gastrointestinal system: Abdomen is nondistended, soft and nontender.  Central nervous system: Alert and oriented. No focal neurological deficits. Extremities: No pedal edema. Skin:  No rashes, lesions or ulcers    Data Reviewed:    Labs: Basic Metabolic Panel: Recent Labs  Lab 11/29/18 1416 11/29/18 1905 11/30/18 0313 11/30/18 1757 12/01/18 0856 12/02/18 0612 12/03/18 0512  NA 141 140 136 137 138 142 147*  K 3.7 4.3 4.8 4.1 4.4 3.0* 3.4*  CL 102 105 108 105 101 105 110  CO2 23 19* 15* 23 29 27 28   GLUCOSE 136* 238* 289* 180* 182* 91 95  BUN 63* 57* 51* 41* 34* 15 8  CREATININE 7.91* 6.77* 4.44* 1.99* 1.15* 0.73 0.70  CALCIUM 8.6* 7.8*  7.3* 7.6* 5.9* 7.8* 8.2*  MG 2.4 2.0 1.8  --  1.3* 2.2  --   PHOS  --   --  4.8*  --   --  1.7*  --    GFR Estimated Creatinine Clearance: 65.2 mL/min (by C-G formula based on SCr of 0.7 mg/dL). Liver Function Tests: Recent Labs  Lab 11/29/18 1416  AST 25  ALT 14  ALKPHOS 51  BILITOT 1.1  PROT 6.9  ALBUMIN 3.5   Recent Labs  Lab 11/29/18 1905  LIPASE 35   Recent Labs  Lab 11/29/18 1604  AMMONIA 16   Coagulation profile Recent Labs  Lab 11/29/18 1905  INR 1.35    CBC: Recent Labs  Lab 11/29/18 1133 11/30/18 0313 12/01/18 0856 12/02/18 0309 12/03/18 0512  WBC 8.3 12.0* 12.5* 9.8 8.8  NEUTROABS 5.8  --   --   --   --   HGB 12.8 12.9 10.7* 11.5* 12.1  HCT 40.4 41.2 33.0* 36.8 39.2  MCV 84.5 84.8 84.8 85.2 84.5  PLT 175 160 114* 139* 209   Cardiac Enzymes: Recent Labs  Lab 11/30/18 0313  CKTOTAL 109  CKMB 10.3*   BNP (last 3 results) No results for input(s): PROBNP in the last 8760 hours. CBG: Recent Labs  Lab 12/02/18 0812 12/02/18 1014 12/02/18 1054 12/02/18 1226 12/02/18 1704  GLUCAP 99 66* 87 112* 106*   D-Dimer: No results for input(s): DDIMER in the last 72 hours. Hgb A1c: No results for input(s): HGBA1C in the last 72 hours. Lipid Profile: No results for input(s): CHOL, HDL, LDLCALC, TRIG, CHOLHDL, LDLDIRECT in the last 72 hours. Thyroid function studies: No results for input(s): TSH, T4TOTAL, T3FREE, THYROIDAB in the last 72 hours.  Invalid input(s): FREET3 Anemia work up: No results for input(s): VITAMINB12, FOLATE, FERRITIN, TIBC, IRON, RETICCTPCT in the last 72 hours. Sepsis Labs: Recent Labs  Lab 11/29/18 1600 11/29/18 1905 11/30/18 0313 11/30/18 1236 12/01/18 0856 12/02/18 0309 12/03/18 0512  WBC  --   --  12.0*  --  12.5* 9.8 8.8  LATICACIDVEN 2.9* 4.3*  --  2.3* 1.5  --   --    Microbiology Recent Results (from the past 240 hour(s))  Gastrointestinal Panel by PCR , Stool     Status: None   Collection Time:  11/29/18  2:16 PM  Result Value Ref Range Status   Campylobacter species NOT DETECTED NOT DETECTED Final   Plesimonas shigelloides NOT DETECTED NOT DETECTED Final   Salmonella species NOT DETECTED NOT DETECTED Final   Yersinia enterocolitica NOT DETECTED NOT DETECTED Final   Vibrio species NOT DETECTED NOT DETECTED Final   Vibrio cholerae NOT DETECTED NOT DETECTED Final   Enteroaggregative E coli (EAEC) NOT DETECTED NOT DETECTED Final   Enteropathogenic E coli (EPEC) NOT DETECTED NOT DETECTED Final   Enterotoxigenic E coli (ETEC) NOT DETECTED NOT DETECTED Final   Shiga like  toxin producing E coli (STEC) NOT DETECTED NOT DETECTED Final   Shigella/Enteroinvasive E coli (EIEC) NOT DETECTED NOT DETECTED Final   Cryptosporidium NOT DETECTED NOT DETECTED Final   Cyclospora cayetanensis NOT DETECTED NOT DETECTED Final   Entamoeba histolytica NOT DETECTED NOT DETECTED Final   Giardia lamblia NOT DETECTED NOT DETECTED Final   Adenovirus F40/41 NOT DETECTED NOT DETECTED Final   Astrovirus NOT DETECTED NOT DETECTED Final   Norovirus GI/GII NOT DETECTED NOT DETECTED Final   Rotavirus A NOT DETECTED NOT DETECTED Final   Sapovirus (I, II, IV, and V) NOT DETECTED NOT DETECTED Final    Comment: Performed at [facility], [address] [place], [place] [address]  Culture, blood (routine x 2)     Status: None (Preliminary result)   Collection Time: 11/29/18  2:17 PM  Result Value Ref Range Status   Specimen Description BLOOD RIGHT ANTECUBITAL  Final   Special Requests   Final    BOTTLES DRAWN AEROBIC ONLY Blood Culture adequate volume Performed at [facility], [address] [place], [place] [address]    Culture NO GROWTH 4 DAYS  Final   Report Status PENDING  Incomplete  Culture, Urine     Status: None   Collection Time: 11/29/18  3:44 PM  Result Value Ref Range Status   Specimen Description   Final    URINE, CATHETERIZED Performed at [facility], [address], [place], [place] [address]    Special Requests   Final    NONE Performed at [facility], [address], [place], [place] [address]    Culture   Final    NO GROWTH Performed at [facility], [address] [place], [place] [address]    Report Status 12/01/2018 FINAL  Final  Culture, blood (routine x 2)     Status: Abnormal (Preliminary result)   Collection Time: 11/29/18  4:00 PM  Result Value Ref Range Status   Specimen Description   Final    BLOOD CENTRAL LINE Performed at [facility], [address], [place], [place] [address]    Special Requests   Final    BOTTLES DRAWN AEROBIC AND ANAEROBIC Blood Culture adequate volume Performed at [facility], [address] [place], [place] [address]    Culture  Setup Time   Final    GRAM POSITIVE COCCI IN BOTH AEROBIC AND ANAEROBIC BOTTLES CRITICAL RESULT CALLED TO, READ BACK BY AND VERIFIED WITH: Damaris Hippo, PHARMD ([facility]) AT 1515 ON 11/30/18 BY C. JESSUP, MLT.    Culture (A)  Final    STAPHYLOCOCCUS SPECIES (COAGULASE NEGATIVE) THE SIGNIFICANCE OF ISOLATING THIS ORGANISM FROM A SINGLE SET OF BLOOD CULTURES WHEN MULTIPLE SETS ARE DRAWN IS UNCERTAIN. PLEASE NOTIFY THE MICROBIOLOGY DEPARTMENT WITHIN ONE WEEK IF SPECIATION AND SENSITIVITIES ARE REQUIRED. Performed at [facility], [address] [place], [place] [address]    Report Status PENDING  Incomplete  Blood Culture ID Panel (Reflexed)     Status: Abnormal   Collection Time: 11/29/18  4:00 PM  Result Value Ref Range Status   Enterococcus species NOT DETECTED NOT DETECTED Final   Listeria monocytogenes NOT DETECTED NOT DETECTED Final   Staphylococcus species DETECTED (A) NOT DETECTED Final    Comment: Methicillin (oxacillin) resistant coagulase negative staphylococcus. Possible blood culture contaminant (unless isolated from more than one blood culture draw or clinical case suggests  pathogenicity). No antibiotic treatment is indicated  for blood  culture contaminants. CRITICAL RESULT CALLED TO, READ BACK BY AND VERIFIED WITH: Damaris Hippo, PHARMD AT 1515 ON 11/30/18 BY C. JESSUP, MLT.    Staphylococcus aureus (BCID) NOT DETECTED NOT DETECTED Final   Methicillin resistance DETECTED (A) NOT DETECTED Final    Comment: CRITICAL RESULT CALLED TO, READ BACK BY AND VERIFIED WITH: M. LILLISTON, PHARMD ([facility]) AT 1515 ON 11/30/18 BY C. JESSUP, MLT.    Streptococcus species NOT DETECTED NOT DETECTED Final   Streptococcus agalactiae NOT DETECTED NOT DETECTED Final   Streptococcus pneumoniae NOT DETECTED NOT DETECTED Final   Streptococcus pyogenes NOT DETECTED NOT DETECTED Final   Acinetobacter baumannii NOT DETECTED NOT DETECTED Final   Enterobacteriaceae species NOT DETECTED NOT DETECTED Final   Enterobacter cloacae complex NOT DETECTED NOT DETECTED Final   Escherichia coli NOT DETECTED NOT DETECTED Final   Klebsiella oxytoca NOT DETECTED NOT DETECTED Final   Klebsiella pneumoniae NOT DETECTED NOT DETECTED Final   Proteus species NOT DETECTED NOT DETECTED Final   Serratia marcescens NOT DETECTED NOT DETECTED Final   Haemophilus influenzae NOT DETECTED NOT DETECTED Final   Neisseria meningitidis NOT DETECTED NOT DETECTED Final   Pseudomonas aeruginosa NOT DETECTED NOT DETECTED Final   Candida albicans NOT DETECTED NOT DETECTED Final   Candida glabrata NOT DETECTED NOT DETECTED Final   Candida krusei NOT DETECTED NOT DETECTED Final   Candida parapsilosis NOT DETECTED NOT DETECTED Final   Candida tropicalis NOT DETECTED NOT DETECTED Final    Comment: Performed at [facility], [address], [place], [place] [address]  MRSA PCR Screening     Status: None   Collection Time: 11/29/18  6:45 PM  Result Value Ref Range Status   MRSA by PCR NEGATIVE NEGATIVE Final    Comment:        The GeneXpert MRSA Assay (FDA approved for NASAL specimens only), is one component of  a comprehensive MRSA colonization surveillance program. It is not intended to diagnose MRSA infection nor to guide or monitor treatment for MRSA infections. Performed at [facility], [address] [place], [place] [address]   Culture, blood (routine x 2)     Status: None (Preliminary result)   Collection Time: 12/01/18 10:52 AM  Result Value Ref Range Status   Specimen Description BLOOD LEFT ARM  Final   Special Requests   Final    BOTTLES DRAWN AEROBIC ONLY Blood Culture adequate volume Performed at [facility], [address], [place], [place] [address]    Culture NO GROWTH 2 DAYS  Final   Report Status PENDING  Incomplete  Culture, blood (routine x 2)     Status: None (Preliminary result)   Collection Time: 12/01/18 10:52 AM  Result Value Ref Range Status   Specimen Description BLOOD LEFT HAND  Final   Special Requests   Final    BOTTLES DRAWN AEROBIC ONLY Blood Culture adequate volume Performed at [facility], [address], [place], [place] [address]    Culture NO GROWTH 2 DAYS  Final   Report Status PENDING  Incomplete     Medications:    Continuous Infusions: . sodium chloride 250 mL (12/03/18 0558)  . ceFEPime (MAXIPIME) IV Stopped (12/02/18 2226)  . heparin 1,350 Units/hr (12/03/18 1005)      LOS: 4 days   Marinda Elk  [facility]   *Please refer to [email or web address], password [email or web address] to get updated schedule on who will round on this  patient, as hospitalists switch teams weekly. If 7PM-7AM, please contact night-coverage at [email or web address], password [email or web address] for any overnight needs.  12/03/2018, 10:20 AM

## 2018-12-04 ENCOUNTER — Inpatient Hospital Stay (HOSPITAL_COMMUNITY): Payer: Medicare Other

## 2018-12-04 DIAGNOSIS — I2602 Saddle embolus of pulmonary artery with acute cor pulmonale: Secondary | ICD-10-CM

## 2018-12-04 LAB — BASIC METABOLIC PANEL
Anion gap: 10 (ref 5–15)
BUN: 5 mg/dL — ABNORMAL LOW (ref 8–23)
CO2: 26 mmol/L (ref 22–32)
Calcium: 8.5 mg/dL — ABNORMAL LOW (ref 8.9–10.3)
Chloride: 107 mmol/L (ref 98–111)
Creatinine, Ser: 0.67 mg/dL (ref 0.44–1.00)
GFR calc Af Amer: >60 mL/min (ref >60)
GFR calc non Af Amer: >60 mL/min (ref >60)
GLUCOSE: 85 mg/dL (ref 70–99)
Potassium: 3.5 mmol/L (ref 3.5–5.1)
Sodium: 143 mmol/L (ref 135–145)

## 2018-12-04 LAB — CULTURE, BLOOD (ROUTINE X 2)
Culture: NO GROWTH
Special Requests: ADEQUATE
Special Requests: ADEQUATE

## 2018-12-04 LAB — CBC
HCT: 44.9 % (ref 36.0–46.0)
Hemoglobin: 13.8 g/dL (ref 12.0–15.0)
MCH: 26.3 pg (ref 26.0–34.0)
MCHC: 30.7 g/dL (ref 30.0–36.0)
MCV: 85.5 fL (ref 80.0–100.0)
Platelets: 223 10*3/uL (ref 150–400)
RBC: 5.25 MIL/uL — ABNORMAL HIGH (ref 3.87–5.11)
RDW: 13.1 % (ref 11.5–15.5)
WBC: 11.9 10*3/uL — AB (ref 4.0–10.5)
nRBC: 0 % (ref 0.0–0.2)

## 2018-12-04 LAB — HEPARIN LEVEL (UNFRACTIONATED): Heparin Unfractionated: 0.46 IU/mL (ref 0.30–0.70)

## 2018-12-04 MED ORDER — RIVAROXABAN 15 MG PO TABS
15.0000 mg | ORAL_TABLET | Freq: Two times a day (BID) | ORAL | Status: DC
  Administered 2018-12-04 – 2018-12-07 (×6): 15 mg via ORAL
  Filled 2018-12-04 (×6): qty 1

## 2018-12-04 MED ORDER — AMLODIPINE BESYLATE 10 MG PO TABS
10.0000 mg | ORAL_TABLET | Freq: Every day | ORAL | Status: DC
  Administered 2018-12-04 – 2018-12-08 (×5): 10 mg via ORAL
  Filled 2018-12-04 (×5): qty 1

## 2018-12-04 MED ORDER — HYDROCHLOROTHIAZIDE 25 MG PO TABS
25.0000 mg | ORAL_TABLET | Freq: Every day | ORAL | Status: DC
  Administered 2018-12-04 – 2018-12-08 (×5): 25 mg via ORAL
  Filled 2018-12-04 (×5): qty 1

## 2018-12-04 MED ORDER — LISINOPRIL 20 MG PO TABS
20.0000 mg | ORAL_TABLET | Freq: Every day | ORAL | Status: DC
  Administered 2018-12-04 – 2018-12-08 (×5): 20 mg via ORAL
  Filled 2018-12-04 (×5): qty 1

## 2018-12-04 MED ORDER — LISINOPRIL-HYDROCHLOROTHIAZIDE 20-25 MG PO TABS: 1.0000 | ORAL_TABLET | Freq: Every day | ORAL | Status: DC

## 2018-12-04 NOTE — Progress Notes (Signed)
ANTICOAGULATION CONSULT NOTE - Follow Up Consult  Pharmacy Consult for Heparin Indication: pulmonary embolus with bilateral DVT  Allergies  Allergen Reactions  . Latex Hives    Patient Measurements: Height: 5\' 2"  (157.5 cm) Weight: 154 lb 8.7 oz (70.1 kg) IBW/kg (Calculated) : 50.1 Heparin Dosing Weight:   Vital Signs: Temp: 100.1 F (37.8 C) (01/04 2047) Temp Source: Oral (01/04 2047) BP: 174/94 (01/04 2256) Pulse Rate: 116 (01/04 2213)  Labs: Recent Labs    12/01/18 0856 12/02/18 0309 12/02/18 0612  12/03/18 0512 12/03/18 0800 12/03/18 1602 12/03/18 2137  HGB 10.7* 11.5*  --   --  12.1  --   --   --   HCT 33.0* 36.8  --   --  39.2  --   --   --   PLT 114* 139*  --   --  209  --   --   --   HEPARINUNFRC  --  <0.10*  --    < >  --  0.25* 0.66 0.43  CREATININE 1.15*  --  0.73  --  0.70  --   --   --    < > = values in this interval not displayed.    Estimated Creatinine Clearance: 65.2 mL/min (by C-G formula based on SCr of 0.7 mg/dL).   Medications:  Infusions:  . sodium chloride 250 mL (12/03/18 0558)  . heparin 1,300 Units/hr (12/04/18 0257)    Assessment: Patient with heparin level at goal again.  No heparin issues noted.  Goal of Therapy:  Heparin level 0.3-0.7 units/ml Monitor platelets by anticoagulation protocol: Yes   Plan:  Continue heparin drip at current rate Recheck level with AM labs  Darlina Guys, Jacquenette Shone Crowford 12/04/2018,4:29 AM

## 2018-12-04 NOTE — Progress Notes (Signed)
[facility] PROGRESS NOTE    Progress Note  Stephanie Snyder  UEA:[id number] DOB: 03/31/1956 DOA: 11/21/2018 PCP: Claudean SeveranceKrienke, Marissa M, MD     Brief Narrative:   Stephanie AmabileCharlotte Snyder is an 63 y.o. female past medical history of psychiatric disorders admitted on 12/31 to the psychiatric holding area and being treated for acute psychosis felt to be exacerbated by UTI on 12/24, had a syncopal episode and becoming unresponsive and hypotensive, she was obtunded (with a baseline creatinine of less than 1) after his syncopal episode her basic metabolic panel was checked and showed a creatinine of 7, CT scan of the head was negative she was given gentle IV fluids and pressors PCCM was consulted  1/1: Stable, BP improving, weaning pressors, with hypoxia, ECHO with suspected PE, U/S with bilateral LE DVT. CT Head/C-Spine 12/31 > 1. No evidence of significant acute traumatic injury to the skull, brain or cervical spine.  ECHO 1/1 > Left ventricle: The cavity size was normal. Wall thickness was normal. Systolic function was vigorous. The estimated ejection fraction was in the range of 65% to 70%. Wall motion was norma, grade 1 diastolic dysfunction. Right ventricle: The cavity size was moderately dilated.Systolic function was moderately reduced. Right atrium: The atrium was moderately dilated. Tricuspid valve: There was mild regurgitation. Pulmonary arteries: Systolic pressure was mildly increased. PA peak pressure: 43 mm Hg. Vas US 1/1 > Findings consistent with acute bilateral lower extremity DVT. CT Angie of the chest on 12/02/2018: Shows submassive PE with right heart strain.  Assessment/Plan:   Hypovolemic shock (HCC): - Likely due to hypovolemia in the setting of decreased oral intake for several days. - She was fluid resuscitated, she is now off pressors. - Blood cultures 1/2 coag negative staph BCID MRSA likely a contaminant. -She has remained afebrile for 48 hours, continue to hold  antibiotics.  Acute PE with acute bilateral DVT: - CT Angio of the chest: Submassive PE. - 2D echo showed normal systolic function right ventricle was moderately dilated and systolic function moderately reduced with right atrium dilation. - Vitals are stable, she needs a mamogram, pap smear and colonoscopy as an outpatient. -We will transition her to Xarelto today.  Acute confusional state/acute psychosis (HCC)/toxic metabolic encephalopathy: Likely multifactorial in the setting of polypharmacy and shock state. EEG completed on 11/29/2018 that was normal. Psych was consulted and she had no paranoid thoughts when she is more confused they are concerned about dementia. She will have to follow-up with her primary care doctor as an outpatient.  Acute kidney injury: Likely prerenal azotemia resolved with fluid resuscitation and pressors.  Anion gap metabolic acidosis: Likely due to hypovolemia now resolved.    DVT prophylaxis: Xarelto. Family Communication:**none Disposition Plan/Barrier to D/C: Home in the morning. Code Status:     Code Status Orders  (From admission, onward)         Start     Ordered   11/29/18 1539  Full code  Continuous     11/29/18 1542        Code Status History    This patient has a current code status but no historical code status.        IV Access:    Peripheral IV   Procedures and diagnostic studies:   No results found.   Medical Consultants:    None.  Anti-Infectives:   IV cefepime  Subjective:    Stephanie Snyder no further paranoid thoughts no complains.  Tolerating her diet.  Objective:    Vitals:  12/04/18 0500 12/04/18 0511 12/04/18 0821 12/04/18 0823  BP:  (!) 160/108 (!) 165/106 (!) 146/109  Pulse:  100 (!) 101 (!) 101  Resp:      Temp:  98.7 F (37.1 C)    TempSrc:  Oral    SpO2:  97% 100%   Weight: 65.9 kg     Height:        Intake/Output Summary (Last 24 hours) at 12/04/2018 1003 Last data filed at  12/04/2018 0543 Gross per 24 hour  Intake 257.88 ml  Output 2650 ml  Net -2392.12 ml   Filed Weights   12/01/18 0433 12/02/18 1007 12/04/18 0500  Weight: 70.8 kg 70.1 kg 65.9 kg    Exam: General exam: In no acute distress. Respiratory system: Good air movement and clear to auscultation. Cardiovascular system: S1 & S2 heard, RRR. No JVD. Gastrointestinal system: Abdomen is nondistended, soft and nontender.  Central nervous system: Alert and oriented. No focal neurological deficits. Extremities: No pedal edema. Skin: No rashes, lesions or ulcers    Data Reviewed:    Labs: Basic Metabolic Panel: Recent Labs  Lab 11/29/18 1416 11/29/18 1905 11/30/18 0313 11/30/18 1757 12/01/18 0856 12/02/18 0612 12/03/18 0512 12/04/18 0531  NA 141 140 136 137 138 142 147* 143  K 3.7 4.3 4.8 4.1 4.4 3.0* 3.4* 3.5  CL 102 105 108 105 101 105 110 107  CO2 23 19* 15* 23 29 27 28 26   GLUCOSE 136* 238* 289* 180* 182* 91 95 85  BUN 63* 57* 51* 41* 34* 15 8 5*  CREATININE 7.91* 6.77* 4.44* 1.99* 1.15* 0.73 0.70 0.67  CALCIUM 8.6* 7.8* 7.3* 7.6* 5.9* 7.8* 8.2* 8.5*  MG 2.4 2.0 1.8  --  1.3* 2.2  --   --   PHOS  --   --  4.8*  --   --  1.7*  --   --    GFR Estimated Creatinine Clearance: 63.3 mL/min (by C-G formula based on SCr of 0.67 mg/dL). Liver Function Tests: Recent Labs  Lab 11/29/18 1416  AST 25  ALT 14  ALKPHOS 51  BILITOT 1.1  PROT 6.9  ALBUMIN 3.5   Recent Labs  Lab 11/29/18 1905  LIPASE 35   Recent Labs  Lab 11/29/18 1604  AMMONIA 16   Coagulation profile Recent Labs  Lab 11/29/18 1905  INR 1.35    CBC: Recent Labs  Lab 11/29/18 1133 11/30/18 0313 12/01/18 0856 12/02/18 0309 12/03/18 0512 12/04/18 0531  WBC 8.3 12.0* 12.5* 9.8 8.8 11.9*  NEUTROABS 5.8  --   --   --   --   --   HGB 12.8 12.9 10.7* 11.5* 12.1 13.8  HCT 40.4 41.2 33.0* 36.8 39.2 44.9  MCV 84.5 84.8 84.8 85.2 84.5 85.5  PLT 175 160 114* 139* 209 223   Cardiac Enzymes: Recent Labs   Lab 11/30/18 0313  CKTOTAL 109  CKMB 10.3*   BNP (last 3 results) No results for input(s): PROBNP in the last 8760 hours. CBG: Recent Labs  Lab 12/02/18 0812 12/02/18 1014 12/02/18 1054 12/02/18 1226 12/02/18 1704  GLUCAP 99 66* 87 112* 106*   D-Dimer: No results for input(s): DDIMER in the last 72 hours. Hgb A1c: No results for input(s): HGBA1C in the last 72 hours. Lipid Profile: No results for input(s): CHOL, HDL, LDLCALC, TRIG, CHOLHDL, LDLDIRECT in the last 72 hours. Thyroid function studies: No results for input(s): TSH, T4TOTAL, T3FREE, THYROIDAB in the last 72 hours.  Invalid input(s): FREET3  Anemia work up: No results for input(s): VITAMINB12, FOLATE, FERRITIN, TIBC, IRON, RETICCTPCT in the last 72 hours. Sepsis Labs: Recent Labs  Lab 11/29/18 1600 11/29/18 1905  11/30/18 1236 12/01/18 0856 12/02/18 0309 12/03/18 0512 12/04/18 0531  WBC  --   --    < >  --  12.5* 9.8 8.8 11.9*  LATICACIDVEN 2.9* 4.3*  --  2.3* 1.5  --   --   --    < > = values in this interval not displayed.   Microbiology Recent Results (from the past 240 hour(s))  Gastrointestinal Panel by PCR , Stool     Status: None   Collection Time: 11/29/18  2:16 PM  Result Value Ref Range Status   Campylobacter species NOT DETECTED NOT DETECTED Final   Plesimonas shigelloides NOT DETECTED NOT DETECTED Final   Salmonella species NOT DETECTED NOT DETECTED Final   Yersinia enterocolitica NOT DETECTED NOT DETECTED Final   Vibrio species NOT DETECTED NOT DETECTED Final   Vibrio cholerae NOT DETECTED NOT DETECTED Final   Enteroaggregative E coli (EAEC) NOT DETECTED NOT DETECTED Final   Enteropathogenic E coli (EPEC) NOT DETECTED NOT DETECTED Final   Enterotoxigenic E coli (ETEC) NOT DETECTED NOT DETECTED Final   Shiga like toxin producing E coli (STEC) NOT DETECTED NOT DETECTED Final   Shigella/Enteroinvasive E coli (EIEC) NOT DETECTED NOT DETECTED Final   Cryptosporidium NOT DETECTED NOT  DETECTED Final   Cyclospora cayetanensis NOT DETECTED NOT DETECTED Final   Entamoeba histolytica NOT DETECTED NOT DETECTED Final   Giardia lamblia NOT DETECTED NOT DETECTED Final   Adenovirus F40/41 NOT DETECTED NOT DETECTED Final   Astrovirus NOT DETECTED NOT DETECTED Final   Norovirus GI/GII NOT DETECTED NOT DETECTED Final   Rotavirus A NOT DETECTED NOT DETECTED Final   Sapovirus (I, II, IV, and V) NOT DETECTED NOT DETECTED Final    Comment: Performed at [facility], [address] [place], [place] [address]  Culture, blood (routine x 2)     Status: None   Collection Time: 11/29/18  2:17 PM  Result Value Ref Range Status   Specimen Description BLOOD RIGHT ANTECUBITAL  Final   Special Requests   Final    BOTTLES DRAWN AEROBIC ONLY Blood Culture adequate volume Performed at [facility], [address] [place], [place] [address]    Culture NO GROWTH 5 DAYS  Final   Report Status 12/04/2018 FINAL  Final  Culture, Urine     Status: None   Collection Time: 11/29/18  3:44 PM  Result Value Ref Range Status   Specimen Description   Final    URINE, CATHETERIZED Performed at [facility], [address] [place], [place] [address]    Special Requests   Final    NONE Performed at [facility], [address] [place], [place] [address]    Culture   Final    NO GROWTH Performed at [facility], [address] [place], [place] [address]    Report Status 12/01/2018 FINAL  Final  Culture, blood (routine x 2)     Status: Abnormal (Preliminary result)   Collection Time: 11/29/18  4:00 PM  Result Value Ref Range Status   Specimen Description   Final    BLOOD CENTRAL LINE Performed at [facility], [address] [place], [place] [address]    Special Requests   Final    BOTTLES DRAWN AEROBIC AND ANAEROBIC Blood Culture adequate  volume Performed at [facility], [address], [place], [place] [address]    Culture  Setup Time   Final    GRAM POSITIVE COCCI IN BOTH AEROBIC AND ANAEROBIC BOTTLES CRITICAL RESULT CALLED TO, READ BACK BY AND VERIFIED WITH: Damaris Hippo, PHARMD ([facility]) AT 1515 ON 11/30/18 BY C. JESSUP, MLT.    Culture (A)  Final    STAPHYLOCOCCUS SPECIES (COAGULASE NEGATIVE) THE SIGNIFICANCE OF ISOLATING THIS ORGANISM FROM A SINGLE SET OF BLOOD CULTURES WHEN MULTIPLE SETS ARE DRAWN IS UNCERTAIN. PLEASE NOTIFY THE MICROBIOLOGY DEPARTMENT WITHIN ONE WEEK IF SPECIATION AND SENSITIVITIES ARE REQUIRED. Performed at [facility], [address], [place], [place] [address]    Report Status PENDING  Incomplete  Blood Culture ID Panel (Reflexed)     Status: Abnormal   Collection Time: 11/29/18  4:00 PM  Result Value Ref Range Status   Enterococcus species NOT DETECTED NOT DETECTED Final   Listeria monocytogenes NOT DETECTED NOT DETECTED Final   Staphylococcus species DETECTED (A) NOT DETECTED Final    Comment: Methicillin (oxacillin) resistant coagulase negative staphylococcus. Possible blood culture contaminant (unless isolated from more than one blood culture draw or clinical case suggests pathogenicity). No antibiotic treatment is indicated for blood  culture contaminants. CRITICAL RESULT CALLED TO, READ BACK BY AND VERIFIED WITH: Damaris Hippo, PHARMD AT 1515 ON 11/30/18 BY C. JESSUP, MLT.    Staphylococcus aureus (BCID) NOT DETECTED NOT DETECTED Final   Methicillin resistance DETECTED (A) NOT DETECTED Final    Comment: CRITICAL RESULT CALLED TO, READ BACK BY AND VERIFIED WITH: M. LILLISTON, PHARMD ([facility]) AT 1515 ON 11/30/18 BY C. JESSUP, MLT.    Streptococcus species NOT DETECTED NOT DETECTED Final   Streptococcus agalactiae NOT DETECTED NOT DETECTED Final   Streptococcus pneumoniae NOT DETECTED NOT DETECTED Final   Streptococcus pyogenes NOT DETECTED NOT DETECTED Final   Acinetobacter baumannii NOT DETECTED NOT DETECTED Final    Enterobacteriaceae species NOT DETECTED NOT DETECTED Final   Enterobacter cloacae complex NOT DETECTED NOT DETECTED Final   Escherichia coli NOT DETECTED NOT DETECTED Final   Klebsiella oxytoca NOT DETECTED NOT DETECTED Final   Klebsiella pneumoniae NOT DETECTED NOT DETECTED Final   Proteus species NOT DETECTED NOT DETECTED Final   Serratia marcescens NOT DETECTED NOT DETECTED Final   Haemophilus influenzae NOT DETECTED NOT DETECTED Final   Neisseria meningitidis NOT DETECTED NOT DETECTED Final   Pseudomonas aeruginosa NOT DETECTED NOT DETECTED Final   Candida albicans NOT DETECTED NOT DETECTED Final   Candida glabrata NOT DETECTED NOT DETECTED Final   Candida krusei NOT DETECTED NOT DETECTED Final   Candida parapsilosis NOT DETECTED NOT DETECTED Final   Candida tropicalis NOT DETECTED NOT DETECTED Final    Comment: Performed at [facility], [address], [place], [place] [address]  MRSA PCR Screening     Status: None   Collection Time: 11/29/18  6:45 PM  Result Value Ref Range Status   MRSA by PCR NEGATIVE NEGATIVE Final    Comment:        The GeneXpert MRSA Assay (FDA approved for NASAL specimens only), is one component of a comprehensive MRSA colonization surveillance program. It is not intended to diagnose MRSA infection nor to guide or monitor treatment for MRSA infections. Performed at [facility], [address], [place], [place] [address]   Culture, blood (routine x 2)     Status: None (Preliminary result)   Collection Time: 12/01/18 10:52  AM  Result Value Ref Range Status   Specimen Description BLOOD LEFT ARM  Final   Special Requests   Final    BOTTLES DRAWN AEROBIC ONLY Blood Culture adequate volume Performed at [facility], [address], [place], [place] [address]    Culture NO GROWTH 3 DAYS  Final   Report Status PENDING  Incomplete  Culture, blood (routine x 2)     Status: None (Preliminary result)    Collection Time: 12/01/18 10:52 AM  Result Value Ref Range Status   Specimen Description BLOOD LEFT HAND  Final   Special Requests   Final    BOTTLES DRAWN AEROBIC ONLY Blood Culture adequate volume Performed at [facility], [address], [place], [place] [address]    Culture NO GROWTH 3 DAYS  Final   Report Status PENDING  Incomplete     Medications:   . amLODipine  10 mg Oral Daily  . lisinopril  20 mg Oral Daily   And  . hydrochlorothiazide  25 mg Oral Daily   Continuous Infusions: . sodium chloride 250 mL (12/04/18 0834)  . heparin 1,300 Units/hr (12/04/18 0257)      LOS: 5 days   Marinda Elk  [facility]   *Please refer to [email or web address], password [email or web address] to get updated schedule on who will round on this patient, as hospitalists switch teams weekly. If 7PM-7AM, please contact night-coverage at [email or web address], password [email or web address] for any overnight needs.  12/04/2018, 10:03 AM

## 2018-12-04 NOTE — Progress Notes (Signed)
ANTICOAGULATION CONSULT NOTE   Pharmacy Consult for Heparin >> Xarelto  Indication: pulmonary embolus with bilateral DVT  Allergies  Allergen Reactions  . Latex Hives   Patient Measurements: Height: 5\' 2"  (157.5 cm) Weight: 145 lb 4.5 oz (65.9 kg) IBW/kg (Calculated) : 50.1  Vital Signs: Temp: 98.7 F (37.1 C) (01/05 0511) Temp Source: Oral (01/05 0511) BP: 146/109 (01/05 0823) Pulse Rate: 101 (01/05 0823)  Labs: Recent Labs    12/02/18 0309 12/02/18 0612  12/03/18 0512  12/03/18 1602 12/03/18 2137 12/04/18 0531  HGB 11.5*  --   --  12.1  --   --   --  13.8  HCT 36.8  --   --  39.2  --   --   --  44.9  PLT 139*  --   --  209  --   --   --  223  HEPARINUNFRC <0.10*  --    < >  --    < > 0.66 0.43 0.46  CREATININE  --  0.73  --  0.70  --   --   --  0.67   < > = values in this interval not displayed.   Estimated Creatinine Clearance: 63.3 mL/min (by C-G formula based on SCr of 0.67 mg/dL).  Medications:  Scheduled:  . amLODipine  10 mg Oral Daily  . lisinopril  20 mg Oral Daily   And  . hydrochlorothiazide  25 mg Oral Daily   Infusions:  . sodium chloride 250 mL (12/04/18 0834)    Assessment: 62 yoF in TCU x 8 days, getting dressed for d/c 12/31 >> syncopal event >> floor, hypotensive. Head/Neck CT: neg for trauma, bleed. Begin Heparin for poss PE  PMH: no seizure hx, was on Depakote/Haldol for psych issues  Today, 12/04/18  HL therapeutic this AM  CBC: Hgb/Plt improved to WNL, stable now  AKI resolved (SCr 6.8 >> 0.7)  Transition to Xarelto today  D5 full anticoagulation, although patient has been subtherapeutic for much of that time  No bleeding issues per RN  Goal of Therapy:  Heparin level 0.3-0.7 units/ml Monitor platelets by anticoagulation protocol: Yes   Plan:   Begin Xarelto 15 mg PO bid x 21 days, then 20 mg PO daily thereafter  Stop heparin drip with first dose of Xarelto  Pharmacy will attempt to educate patient prior to  discharge to home although patient appears to be somewhat demented. Acute clot definitely needs treatment, but may want to consider shortest course possible (ie 3 months) given risks of unsupervised anticoagulation in mildly demented patient.  Bernadene Person, PharmD, BCPS [phone] 12/04/2018, 10:30 AM

## 2018-12-05 ENCOUNTER — Inpatient Hospital Stay (HOSPITAL_COMMUNITY): Payer: Medicare Other

## 2018-12-05 DIAGNOSIS — I639 Cerebral infarction, unspecified: Secondary | ICD-10-CM

## 2018-12-05 DIAGNOSIS — I2609 Other pulmonary embolism with acute cor pulmonale: Secondary | ICD-10-CM

## 2018-12-05 DIAGNOSIS — I959 Hypotension, unspecified: Secondary | ICD-10-CM

## 2018-12-05 DIAGNOSIS — R55 Syncope and collapse: Secondary | ICD-10-CM

## 2018-12-05 DIAGNOSIS — I63311 Cerebral infarction due to thrombosis of right middle cerebral artery: Principal | ICD-10-CM

## 2018-12-05 LAB — CBC
HCT: 38.6 % (ref 36.0–46.0)
Hemoglobin: 12.2 g/dL (ref 12.0–15.0)
MCH: 27.1 pg (ref 26.0–34.0)
MCHC: 31.6 g/dL (ref 30.0–36.0)
MCV: 85.6 fL (ref 80.0–100.0)
Platelets: 213 10*3/uL (ref 150–400)
RBC: 4.51 MIL/uL (ref 3.87–5.11)
RDW: 13.5 % (ref 11.5–15.5)
WBC: 14.1 10*3/uL — ABNORMAL HIGH (ref 4.0–10.5)
nRBC: 0 % (ref 0.0–0.2)

## 2018-12-05 LAB — BASIC METABOLIC PANEL
Anion gap: 8 (ref 5–15)
BUN: 8 mg/dL (ref 8–23)
CO2: 25 mmol/L (ref 22–32)
Calcium: 8.3 mg/dL — ABNORMAL LOW (ref 8.9–10.3)
Chloride: 107 mmol/L (ref 98–111)
Creatinine, Ser: 0.81 mg/dL (ref 0.44–1.00)
GFR calc Af Amer: >60 mL/min (ref >60)
GFR calc non Af Amer: >60 mL/min (ref >60)
Glucose, Bld: 95 mg/dL (ref 70–99)
Potassium: 3.4 mmol/L — ABNORMAL LOW (ref 3.5–5.1)
Sodium: 140 mmol/L (ref 135–145)

## 2018-12-05 MED ORDER — ASPIRIN 325 MG PO TABS: 325.0000 mg | ORAL_TABLET | Freq: Every day | ORAL | Status: DC

## 2018-12-05 MED ORDER — SODIUM CHLORIDE (PF) 0.9 % IJ SOLN
INTRAMUSCULAR | Status: AC
  Filled 2018-12-05: qty 50

## 2018-12-05 MED ORDER — ASPIRIN 300 MG RE SUPP: 300.0000 mg | Freq: Every day | RECTAL | Status: DC

## 2018-12-05 MED ORDER — INFLUENZA VAC SPLIT QUAD 0.5 ML IM SUSY
0.5000 mL | PREFILLED_SYRINGE | INTRAMUSCULAR | Status: AC
  Administered 2018-12-06: 0.5 mL via INTRAMUSCULAR
  Filled 2018-12-05 (×2): qty 0.5

## 2018-12-05 MED ORDER — IOPAMIDOL (ISOVUE-370) INJECTION 76%
INTRAVENOUS | Status: AC
  Filled 2018-12-05: qty 100

## 2018-12-05 MED ORDER — PNEUMOCOCCAL VAC POLYVALENT 25 MCG/0.5ML IJ INJ
0.5000 mL | INJECTION | INTRAMUSCULAR | Status: AC
  Administered 2018-12-06: 0.5 mL via INTRAMUSCULAR
  Filled 2018-12-05: qty 0.5

## 2018-12-05 MED ORDER — IOPAMIDOL (ISOVUE-370) INJECTION 76%
100.0000 mL | Freq: Once | INTRAVENOUS | Status: AC | PRN
  Administered 2018-12-05: 100 mL via INTRAVENOUS

## 2018-12-05 MED ORDER — STROKE: EARLY STAGES OF RECOVERY BOOK
Freq: Once | Status: AC
  Administered 2018-12-05: 17:00:00
  Filled 2018-12-05: qty 1

## 2018-12-05 NOTE — Care Management Note (Addendum)
Case Management Note  Patient Details  Name: Stephanie Snyder MRN: [id number] Date of Birth: 07-05-1956  Subjective/Objective:                    Action/Plan:PCP list given to pt and spouse at bedside. Pt's husband states pt have PCP at [facility] with [facility]. Pt would benefit with [facility]. Referral given to [facility].    Expected Discharge Date:  (unknown)               Expected Discharge Plan:  Home/Self Care  In-House Referral:     Discharge planning Services  CM Consult  Post Acute Care Choice:    Choice offered to:  Patient, Spouse  DME Arranged:    DME Agency:     HH Arranged:    HH Agency:     Status of Service:  In process, will continue to follow  If discussed at Long Length of Stay Meetings, dates discussed:    Additional CommentsGeni Bers, RN 12/05/2018, 1:36 PM

## 2018-12-05 NOTE — Progress Notes (Signed)
Report received from M.Long,RN. No change in assessment.  Continue plan of care. Stephanie Snyder 

## 2018-12-05 NOTE — Care Management Important Message (Signed)
Important Message  Patient Details  Name: Stephanie Snyder MRN: [id number] Date of Birth: [birth year]   Medicare Important Message Given:  Yes    Caren Macadam 12/05/2018, 1:53 PMImportant Message  Patient Details  Name: Stephanie Snyder MRN: [id number] Date of Birth: 07/23/1956   Medicare Important Message Given:  Yes    Caren Macadam 12/05/2018, 1:53 PM

## 2018-12-05 NOTE — Plan of Care (Signed)
  Problem: Education: Goal: Knowledge of General Education information will improve Description Including pain rating scale, medication(s)/side effects and non-pharmacologic comfort measures Outcome: Progressing   

## 2018-12-05 NOTE — Progress Notes (Signed)
PT Cancellation Note  Patient Details Name: Stephanie Snyder MRN: [id number] DOB: 07/31/1956   Cancelled Treatment:    Reason Eval/Treat Not Completed: Medical issues which prohibited therapy, to [facility].   [name], [name] PT Acute Rehabilitation Services Pager [phone] Office [phone]  12/05/2018, 3:36 PM

## 2018-12-05 NOTE — Progress Notes (Signed)
  Echocardiogram 2D Echocardiogram has been performed.  Gerda Diss 12/05/2018, 12:46 PM

## 2018-12-05 NOTE — Progress Notes (Signed)
Orders for swallow screen and NIH stroke scale.   Passed swallow screen Noted decreased sensation left leg to touch, oriented to person and place disoriented to year. Able to stand and walk to bed from chair. Pt rambles and repeats thoughts , ? memory issues

## 2018-12-05 NOTE — Progress Notes (Signed)
[facility] PROGRESS NOTE    Progress Note  Stephanie Snyder  ZOX:[id number] DOB: 1956/01/26 DOA: 11/21/2018 PCP: Claudean Severance, MD     Brief Narrative:   Stephanie Snyder is an 63 y.o. female past medical history of psychiatric disorders admitted on 12/31 to the psychiatric holding area and being treated for acute psychosis felt to be exacerbated by UTI on 12/24, had a syncopal episode and becoming unresponsive and hypotensive, she was obtunded (with a baseline creatinine of less than 1) after his syncopal episode her basic metabolic panel was checked and showed a creatinine of 7, CT scan of the head was negative she was given gentle IV fluids and pressors PCCM was consulted admitted under PCCM intubated and put on pressors extubated and transferred [facility] 12/02/2017.  1/1: Stable, weaning pressors, with hypoxia, ECHO with suspected PE, U/S with bilateral LE DVT. CT Head/C-Spine 12/31 > No evidence of significant acute traumatic injury to the skull, brain or cervical spine.  ECHO 1/1 > Left ventricle: The cavity size was normal. Wall thickness was normal. Systolic function was vigorous. The estimated ejection fraction was in the range of 65% to 70%. Wall motion was norma, grade 1 diastolic dysfunction. Right ventricle: The cavity size was moderately dilated.Systolic function was moderately reduced. Right atrium: The atrium was moderately dilated. Tricuspid valve: There was mild regurgitation. Pulmonary arteries: Systolic pressure was mildly increased. PA peak pressure: 43 mm Hg. Vas [place] 1/1 > Findings consistent with acute bilateral lower extremity DVT. CT Angie of the chest on 12/02/2018: Shows submassive PE with right heart strain. MRI of the brain on 12/04/2018 showed possible 5 mm we will padded a stroke on the right.  Assessment/Plan:   Hypovolemic shock (HCC): - She was fluid resuscitated, she is now off pressors. - Blood cultures 1/2 coag negative staph BCID coag negative  staph likely a contaminant. -She has remained afebrile for over 72 hrs, continue to hold antibiotics.  Acute PE with acute bilateral DVT: - CT Angio of the chest: Submassive PE, with right heart strain. - 2D echo showed normal systolic function right ventricle was moderately dilated and systolic function moderately reduced with right atrium dilation. - Vitals are stable, she needs a mamogram, pap smear and colonoscopy as an outpatient. -She is on Xarelto and tolerating it.  Acute confusional state/acute psychosis (HCC)/toxic metabolic encephalopathy: Likely multifactorial in the setting of polypharmacy and shock state. EEG completed on 11/29/2018 that was normal. Psych was consulted and she had no paranoid thoughts when she is more confused they are concerned about dementia. She will have to follow-up with her primary care doctor as an outpatient.  Acute kidney injury: Likely prerenal azotemia resolved with fluid resuscitation and pressors.  Anion gap metabolic acidosis: Likely due to hypovolemia now resolved.  New CVA of the globus pallidus: HgbA1c, fasting lipid panel  MRI, MRA 5 mm globus pallidus CVA PT, OT, Speech consult  Echocardiogram with bubble studies. Carotid dopplers  She is on Xarelto. Neurology has been consulted they recommended transfer to [facility].  Leukocytosis: She had 1 out of 2 blood cultures positive for methicillin-resistant staph coag negative. Remain off antibiotics and has remained afebrile. Repeated blood cultures have remained negative till date.   DVT prophylaxis: Xarelto. Family Communication:**none Disposition Plan/Barrier to D/C: Transfer to [facility]. Code Status:     Code Status Orders  (From admission, onward)         Start     Ordered   11/29/18 1539  Full code  Continuous     11/29/18 1542        Code Status History    This patient has a current code status but no historical code status.        IV Access:     Peripheral IV   Procedures and diagnostic studies:   Mr Brain Wo Contrast  Result Date: 12/04/2018 CLINICAL DATA:  63 y/o  F; acute psychosis. EXAM: MRI HEAD WITHOUT CONTRAST TECHNIQUE: Multiplanar, multiecho pulse sequences of the brain and surrounding structures were obtained without intravenous contrast. COMPARISON:  12/01/2017 CT head. FINDINGS: Brain: 5 mm focus of reduced diffusion within the right globus pallidus with increased T2 signal (series 4, image 25 and series 9, image 26). No associated hemorrhage or mass effect. Several nonspecific T2 FLAIR hyperintensities in subcortical and periventricular white matter are compatible with mild chronic microvascular ischemic changes for age. Mild volume loss of the brain. No extra-axial collection, hydrocephalus, mass effect, or herniation. Vascular: Normal flow voids. Skull and upper cervical spine: Normal marrow signal. Sinuses/Orbits: Mild mucosal thickening of the paranasal sinuses. No abnormal signal of the mastoid air cells. Orbits are unremarkable. Other: None. IMPRESSION: 1. 5 mm focus of reduced diffusion within the right globus pallidus probably representing an acute/early subacute lacunar infarction. No associated hemorrhage or mass effect. 2. Mild chronic microvascular ischemic changes and volume loss of the brain. These results will be called to the ordering clinician or representative by the Radiologist Assistant, and communication documented in the PACS or zVision Dashboard. Electronically Signed   By: Mitzi HansenLance  Furusawa-Stratton M.D.   On: 12/04/2018 17:19     Medical Consultants:    None.  Anti-Infectives:   IV cefepime  Subjective:    Stephanie Snyder no further paranoid thoughts no complains.  Tolerating her diet.  Objective:    Vitals:   12/04/18 0823 12/04/18 1330 12/04/18 2314 12/05/18 0609  BP: (!) 146/109 (!) 131/92 (!) 148/96 (!) 155/98  Pulse: (!) 101 (!) 113 (!) 110 (!) 101  Resp:  20 18 16   Temp:  98.5  F (36.9 C) 98.1 F (36.7 C) 99.4 F (37.4 C)  TempSrc:   Oral Oral  SpO2:  99% 100% 94%  Weight:    66 kg  Height:        Intake/Output Summary (Last 24 hours) at 12/05/2018 1014 Last data filed at 12/04/2018 1648 Gross per 24 hour  Intake 840 ml  Output 700 ml  Net 140 ml   Filed Weights   12/02/18 1007 12/04/18 0500 12/05/18 0609  Weight: 70.1 kg 65.9 kg 66 kg    Exam: General exam: In no acute distress. Respiratory system: Good air movement and clear to auscultation. Cardiovascular system: S1 & S2 heard, RRR. No JVD. Gastrointestinal system: Abdomen is nondistended, soft and nontender.  Central nervous system: Alert and oriented. No focal neurological deficits. Extremities: No pedal edema. Skin: No rashes, lesions or ulcers    Data Reviewed:    Labs: Basic Metabolic Panel: Recent Labs  Lab 11/29/18 1416 11/29/18 1905 11/30/18 0313  12/01/18 0856 12/02/18 0612 12/03/18 0512 12/04/18 0531 12/05/18 0508  NA 141 140 136   < > 138 142 147* 143 140  K 3.7 4.3 4.8   < > 4.4 3.0* 3.4* 3.5 3.4*  CL 102 105 108   < > 101 105 110 107 107  CO2 23 19* 15*   < > 29 27 28 26 25   GLUCOSE 136* 238* 289*   < >  182* 91 95 85 95  BUN 63* 57* 51*   < > 34* 15 8 5* 8  CREATININE 7.91* 6.77* 4.44*   < > 1.15* 0.73 0.70 0.67 0.81  CALCIUM 8.6* 7.8* 7.3*   < > 5.9* 7.8* 8.2* 8.5* 8.3*  MG 2.4 2.0 1.8  --  1.3* 2.2  --   --   --   PHOS  --   --  4.8*  --   --  1.7*  --   --   --    < > = values in this interval not displayed.   GFR Estimated Creatinine Clearance: 62.6 mL/min (by C-G formula based on SCr of 0.81 mg/dL). Liver Function Tests: Recent Labs  Lab 11/29/18 1416  AST 25  ALT 14  ALKPHOS 51  BILITOT 1.1  PROT 6.9  ALBUMIN 3.5   Recent Labs  Lab 11/29/18 1905  LIPASE 35   Recent Labs  Lab 11/29/18 1604  AMMONIA 16   Coagulation profile Recent Labs  Lab 11/29/18 1905  INR 1.35    CBC: Recent Labs  Lab 11/29/18 1133  12/01/18 0856  12/02/18 0309 12/03/18 0512 12/04/18 0531 12/05/18 0508  WBC 8.3   < > 12.5* 9.8 8.8 11.9* 14.1*  NEUTROABS 5.8  --   --   --   --   --   --   HGB 12.8   < > 10.7* 11.5* 12.1 13.8 12.2  HCT 40.4   < > 33.0* 36.8 39.2 44.9 38.6  MCV 84.5   < > 84.8 85.2 84.5 85.5 85.6  PLT 175   < > 114* 139* 209 223 213   < > = values in this interval not displayed.   Cardiac Enzymes: Recent Labs  Lab 11/30/18 0313  CKTOTAL 109  CKMB 10.3*   BNP (last 3 results) No results for input(s): PROBNP in the last 8760 hours. CBG: Recent Labs  Lab 12/02/18 0812 12/02/18 1014 12/02/18 1054 12/02/18 1226 12/02/18 1704  GLUCAP 99 66* 87 112* 106*   D-Dimer: No results for input(s): DDIMER in the last 72 hours. Hgb A1c: No results for input(s): HGBA1C in the last 72 hours. Lipid Profile: No results for input(s): CHOL, HDL, LDLCALC, TRIG, CHOLHDL, LDLDIRECT in the last 72 hours. Thyroid function studies: No results for input(s): TSH, T4TOTAL, T3FREE, THYROIDAB in the last 72 hours.  Invalid input(s): FREET3 Anemia work up: No results for input(s): VITAMINB12, FOLATE, FERRITIN, TIBC, IRON, RETICCTPCT in the last 72 hours. Sepsis Labs: Recent Labs  Lab 11/29/18 1600 11/29/18 1905  11/30/18 1236 12/01/18 0856 12/02/18 0309 12/03/18 0512 12/04/18 0531 12/05/18 0508  WBC  --   --    < >  --  12.5* 9.8 8.8 11.9* 14.1*  LATICACIDVEN 2.9* 4.3*  --  2.3* 1.5  --   --   --   --    < > = values in this interval not displayed.   Microbiology Recent Results (from the past 240 hour(s))  Gastrointestinal Panel by PCR , Stool     Status: None   Collection Time: 11/29/18  2:16 PM  Result Value Ref Range Status   Campylobacter species NOT DETECTED NOT DETECTED Final   Plesimonas shigelloides NOT DETECTED NOT DETECTED Final   Salmonella species NOT DETECTED NOT DETECTED Final   Yersinia enterocolitica NOT DETECTED NOT DETECTED Final   Vibrio species NOT DETECTED NOT DETECTED Final   Vibrio  cholerae NOT DETECTED NOT DETECTED Final   Enteroaggregative E  coli (EAEC) NOT DETECTED NOT DETECTED Final   Enteropathogenic E coli (EPEC) NOT DETECTED NOT DETECTED Final   Enterotoxigenic E coli (ETEC) NOT DETECTED NOT DETECTED Final   Shiga like toxin producing E coli (STEC) NOT DETECTED NOT DETECTED Final   Shigella/Enteroinvasive E coli (EIEC) NOT DETECTED NOT DETECTED Final   Cryptosporidium NOT DETECTED NOT DETECTED Final   Cyclospora cayetanensis NOT DETECTED NOT DETECTED Final   Entamoeba histolytica NOT DETECTED NOT DETECTED Final   Giardia lamblia NOT DETECTED NOT DETECTED Final   Adenovirus F40/41 NOT DETECTED NOT DETECTED Final   Astrovirus NOT DETECTED NOT DETECTED Final   Norovirus GI/GII NOT DETECTED NOT DETECTED Final   Rotavirus A NOT DETECTED NOT DETECTED Final   Sapovirus (I, II, IV, and V) NOT DETECTED NOT DETECTED Final    Comment: Performed at [facility], [address] [place], [place] [address]  Culture, blood (routine x 2)     Status: None   Collection Time: 11/29/18  2:17 PM  Result Value Ref Range Status   Specimen Description BLOOD RIGHT ANTECUBITAL  Final   Special Requests   Final    BOTTLES DRAWN AEROBIC ONLY Blood Culture adequate volume Performed at [facility], [address], [place], [place] [address]    Culture NO GROWTH 5 DAYS  Final   Report Status 12/04/2018 FINAL  Final  Culture, Urine     Status: None   Collection Time: 11/29/18  3:44 PM  Result Value Ref Range Status   Specimen Description   Final    URINE, CATHETERIZED Performed at [facility], [address] [place], [place] [address]    Special Requests   Final    NONE Performed at [facility], [address], [place], [place] [address]    Culture   Final    NO GROWTH Performed at [facility], [address], [place], [place] [address]    Report Status 12/01/2018 FINAL  Final  Culture, blood  (routine x 2)     Status: Abnormal   Collection Time: 11/29/18  4:00 PM  Result Value Ref Range Status   Specimen Description   Final    BLOOD CENTRAL LINE Performed at [facility], [address], [place], [place] [address]    Special Requests   Final    BOTTLES DRAWN AEROBIC AND ANAEROBIC Blood Culture adequate volume Performed at [facility], [address] [place], [place] [address]    Culture  Setup Time   Final    GRAM POSITIVE COCCI IN BOTH AEROBIC AND ANAEROBIC BOTTLES CRITICAL RESULT CALLED TO, READ BACK BY AND VERIFIED WITH: Damaris Hippo, PHARMD ([facility]) AT 1515 ON 11/30/18 BY C. JESSUP, MLT.    Culture (A)  Final    STAPHYLOCOCCUS SPECIES (COAGULASE NEGATIVE) THE SIGNIFICANCE OF ISOLATING THIS ORGANISM FROM A SINGLE SET OF BLOOD CULTURES WHEN MULTIPLE SETS ARE DRAWN IS UNCERTAIN. PLEASE NOTIFY THE MICROBIOLOGY DEPARTMENT WITHIN ONE WEEK IF SPECIATION AND SENSITIVITIES ARE REQUIRED. Performed at [facility], [address] [place], [place] [address]    Report Status 12/04/2018 FINAL  Final  Blood Culture ID Panel (Reflexed)     Status: Abnormal   Collection Time: 11/29/18  4:00 PM  Result Value Ref Range Status   Enterococcus species NOT DETECTED NOT DETECTED Final   Listeria monocytogenes NOT DETECTED NOT DETECTED Final   Staphylococcus species DETECTED (A) NOT DETECTED Final  Comment: Methicillin (oxacillin) resistant coagulase negative staphylococcus. Possible blood culture contaminant (unless isolated from more than one blood culture draw or clinical case suggests pathogenicity). No antibiotic treatment is indicated for blood  culture contaminants. CRITICAL RESULT CALLED TO, READ BACK BY AND VERIFIED WITH: Damaris Hippo, PHARMD AT 1515 ON 11/30/18 BY C. JESSUP, MLT.    Staphylococcus aureus (BCID) NOT DETECTED NOT DETECTED Final   Methicillin resistance DETECTED (A) NOT DETECTED Final    Comment: CRITICAL RESULT CALLED TO,  READ BACK BY AND VERIFIED WITH: M. LILLISTON, PHARMD ([facility]) AT 1515 ON 11/30/18 BY C. JESSUP, MLT.    Streptococcus species NOT DETECTED NOT DETECTED Final   Streptococcus agalactiae NOT DETECTED NOT DETECTED Final   Streptococcus pneumoniae NOT DETECTED NOT DETECTED Final   Streptococcus pyogenes NOT DETECTED NOT DETECTED Final   Acinetobacter baumannii NOT DETECTED NOT DETECTED Final   Enterobacteriaceae species NOT DETECTED NOT DETECTED Final   Enterobacter cloacae complex NOT DETECTED NOT DETECTED Final   Escherichia coli NOT DETECTED NOT DETECTED Final   Klebsiella oxytoca NOT DETECTED NOT DETECTED Final   Klebsiella pneumoniae NOT DETECTED NOT DETECTED Final   Proteus species NOT DETECTED NOT DETECTED Final   Serratia marcescens NOT DETECTED NOT DETECTED Final   Haemophilus influenzae NOT DETECTED NOT DETECTED Final   Neisseria meningitidis NOT DETECTED NOT DETECTED Final   Pseudomonas aeruginosa NOT DETECTED NOT DETECTED Final   Candida albicans NOT DETECTED NOT DETECTED Final   Candida glabrata NOT DETECTED NOT DETECTED Final   Candida krusei NOT DETECTED NOT DETECTED Final   Candida parapsilosis NOT DETECTED NOT DETECTED Final   Candida tropicalis NOT DETECTED NOT DETECTED Final    Comment: Performed at [facility], [address] [place], [place] [address]  MRSA PCR Screening     Status: None   Collection Time: 11/29/18  6:45 PM  Result Value Ref Range Status   MRSA by PCR NEGATIVE NEGATIVE Final    Comment:        The GeneXpert MRSA Assay (FDA approved for NASAL specimens only), is one component of a comprehensive MRSA colonization surveillance program. It is not intended to diagnose MRSA infection nor to guide or monitor treatment for MRSA infections. Performed at [facility], [address] [place], [place] [address]   Culture, blood (routine x 2)     Status: None (Preliminary result)   Collection Time: 12/01/18 10:52 AM  Result Value  Ref Range Status   Specimen Description BLOOD LEFT ARM  Final   Special Requests   Final    BOTTLES DRAWN AEROBIC ONLY Blood Culture adequate volume Performed at [facility], [address], [place], [place] [address]    Culture NO GROWTH 3 DAYS  Final   Report Status PENDING  Incomplete  Culture, blood (routine x 2)     Status: None (Preliminary result)   Collection Time: 12/01/18 10:52 AM  Result Value Ref Range Status   Specimen Description BLOOD LEFT HAND  Final   Special Requests   Final    BOTTLES DRAWN AEROBIC ONLY Blood Culture adequate volume Performed at [facility], [address], [place], [place] [address]    Culture NO GROWTH 3 DAYS  Final   Report Status PENDING  Incomplete     Medications:   . amLODipine  10 mg Oral Daily  . lisinopril  20 mg Oral Daily   And  . hydrochlorothiazide  25 mg Oral Daily  .  Rivaroxaban  15 mg Oral BID WC   Continuous Infusions: . sodium chloride 250 mL (12/04/18 0834)      LOS: 6 days   Marinda Elk  [facility]   *Please refer to [email or web address], password [email or web address] to get updated schedule on who will round on this patient, as hospitalists switch teams weekly. If 7PM-7AM, please contact night-coverage at [email or web address], password [email or web address] for any overnight needs.  12/05/2018, 10:14 AM

## 2018-12-05 NOTE — Consult Note (Signed)
Stroke Neurology Consultation Note  Consult Requested by: Dr. Robb Matarrtiz  Reason for Consult: stroke  Consult Date: 12/05/18  The history was obtained from the pt and fiance.  During history and examination, all items were able to obtain unless otherwise noted.  History of Present Illness:  Sung AmabileCharlotte Espiritu is a 63 y.o. African American female with PMH of HTN, arrhythmia admitted for syncope and hypotension.  As per fiance, pt was sleeping normally 11/20/18 in the morning, but got up confused and kept saying "there is a bomb in house" and then kept hold her head, seemed to have HA. Fiance had to call 911 and they sent her to [facility] ED. After treatment, she was about to be discharged and then she had episode of syncope and hypotension. CCM consulted and [facility] and [facility]F. Concerning for hypovolumic shock. During admission, she was found to have submassive PE with right heart strain. She was also found to have bilateral DVT. Initially she was put on heparin IV and now transitioned to Xarelto. EEG was normal on 11/29/18. MRI showed right GP small stroke. Neurology was consulted for that.  Pt and fiance stated that she had syncope in 2008 summer and her BP was very high and while doing TTE she was found to have irregular heart beat. Fiance stated it sounds like "Aflutter" or "afib" but not sure. [facility], was told to have HTN, no any medication prescribed.   LSN: 11/20/18 tPA Given: No: outside window  Past Medical History:  Diagnosis Date  . Arthritis   . Asthma   . CVA (cerebral vascular accident) (HCC) 2008  . Hypertension   . Irregular heart beat 2015    Past Surgical History:  Procedure Laterality Date  . TUBAL LIGATION  1984    Family History  Problem Relation Age of Onset  . Cancer Father     Social History:  reports that she has never smoked. She has never used smokeless tobacco. She reports that she does not drink alcohol or  use drugs.  Allergies:  Allergies  Allergen Reactions  . Latex Hives    No current facility-administered medications on file prior to encounter.    Current Outpatient Medications on File Prior to Encounter  Medication Sig Dispense Refill  . albuterol (PROVENTIL HFA;VENTOLIN HFA) 108 (90 Base) MCG/ACT inhaler Inhale 2 puffs into the lungs every 6 (six) hours as needed for wheezing or shortness of breath. 18 g 3  . amLODipine (NORVASC) 10 MG tablet Take 1 tablet (10 mg total) by mouth daily. 90 tablet 2  . diclofenac sodium (VOLTAREN) 1 % GEL Apply 2 g topically 4 (four) times daily. As needed 1 Tube 2  . lisinopril-hydrochlorothiazide (PRINZIDE,ZESTORETIC) 20-25 MG tablet Take 1 tablet by mouth daily. 90 tablet 2  . meloxicam (MOBIC) 7.5 MG tablet Take 1 tablet (7.5 mg total) by mouth daily as needed for pain. 30 tablet 5    Review of Systems: A full ROS was attempted today and was able to be performed.  Systems assessed include - Constitutional, Eyes, HENT, Respiratory, Cardiovascular, Gastrointestinal, Genitourinary, Integument/breast, Hematologic/lymphatic, Musculoskeletal, Neurological, Behavioral/Psych, Endocrine, Allergic/Immunologic - with pertinent responses as per HPI.  Physical Examination: Temp:  [97.7 F (36.5 C)-99.4 F (37.4 C)] 98.4 F (36.9 C) (01/06 1534) Pulse Rate:  [101-115] 115 (01/06 1724) Resp:  [14-18] 18 (01/06 1724) BP: (132-155)/(77-98) 151/92 (01/06 1724) SpO2:  [94 %-100 %] 96 % (01/06 1724) Weight:  [16[66  kg] 66 kg (01/06 0609)  General - well nourished, well developed, in no apparent distress.    Ophthalmologic - fundi not visualized due to noncooperation.    Cardiovascular - regular rate and rhythm, but tachycardia  Mental Status -  Level of arousal and orientation to time, place, and person were intact. Language including expression, repetition, comprehension, reading, and writing was assessed and found intact. Naming 3/4 Attention span and  concentration were impaired, not able backspell WORLD  Cranial Nerves II - XII - II - Vision intact OU. III, IV, VI - Extraocular movements intact. V - Facial sensation intact bilaterally. VII - mild left facial droop. VIII - Hearing & vestibular intact bilaterally. X - Palate elevates symmetrically. XI - Chin turning & shoulder shrug intact bilaterally. XII - Tongue protrusion intact.  Motor Strength - The patient's strength was normal in all extremities and pronator drift was absent.   Motor Tone & Bulk - Muscle tone was assessed at the neck and appendages and was normal.  Bulk was normal and fasciculations were absent.   Reflexes - The patient's reflexes were normal in all extremities and she had no pathological reflexes.  Sensory - Light touch, temperature/pinprick were assessed and were normal.    Coordination - The patient had normal movements in the hands and feet with no ataxia or dysmetria.  Tremor was absent.  Gait and Station - deferred  Data Reviewed: Dg Chest 2 View  Result Date: 11/22/2018 CLINICAL DATA:  Altered mental status. EXAM: CHEST - 2 VIEW COMPARISON:  07/27/2014. FINDINGS: Normal sized heart. Clear lungs. Mild thoracic spine degenerative changes. IMPRESSION: No acute abnormality. Electronically Signed   By: Beckie Salts M.D.   On: 11/22/2018 11:51   Ct Head Wo Contrast  Result Date: 12/01/2018 CLINICAL DATA:  Encephalopathy, altered mental status. EXAM: CT HEAD WITHOUT CONTRAST TECHNIQUE: Contiguous axial images were obtained from the base of the skull through the vertex without intravenous contrast. COMPARISON:  CT scan of November 29, 2018. FINDINGS: Brain: Mild chronic ischemic white matter disease is noted. No mass effect or midline shift is noted. Ventricular size is within normal limits. There is no evidence of mass lesion, hemorrhage or acute infarction. Vascular: No hyperdense vessel or unexpected calcification. Skull: Normal. Negative for fracture or  focal lesion. Sinuses/Orbits: No acute finding. Other: None. IMPRESSION: Mild chronic ischemic white matter disease. No acute intracranial abnormality seen. Electronically Signed   By: Lupita Raider, M.D.   On: 12/01/2018 14:49   Ct Head Wo Contrast  Result Date: 11/29/2018 CLINICAL DATA:  63 year old female with history of fall and apparent loss of consciousness. Syncopal event. EXAM: CT HEAD WITHOUT CONTRAST CT CERVICAL SPINE WITHOUT CONTRAST TECHNIQUE: Multidetector CT imaging of the head and cervical spine was performed following the standard protocol without intravenous contrast. Multiplanar CT image reconstructions of the cervical spine were also generated. COMPARISON:  None. FINDINGS: CT HEAD FINDINGS Brain: Patchy and confluent areas of decreased attenuation are noted throughout the deep and periventricular white matter of the cerebral hemispheres bilaterally, compatible with chronic microvascular ischemic disease. Mild physiologic calcifications in the basal ganglia bilaterally no evidence of acute infarction, hemorrhage, hydrocephalus, extra-axial collection or mass lesion/mass effect. Vascular: No hyperdense vessel or unexpected calcification. Skull: Normal. Negative for fracture or focal lesion. Sinuses/Orbits: No acute finding. Other: None. CT CERVICAL SPINE FINDINGS Alignment: Normal. Skull base and vertebrae: No acute fracture. No primary bone lesion or focal pathologic process. Soft tissues and spinal canal: No prevertebral fluid or  swelling. No visible canal hematoma. Disc levels: Multilevel degenerative disc disease, most pronounced at C4-C5 and C5-C6. Mild multilevel facet arthropathy. Upper chest: Mild emphysematous changes. Other: Unremarkable. IMPRESSION: 1. No evidence of significant acute traumatic injury to the skull, brain or cervical spine. 2. Chronic microvascular ischemic changes in the cerebral white matter redemonstrated, as above. 3. Multilevel degenerative disc disease and  cervical spondylosis, as above. Electronically Signed   By: Trudie Reed M.D.   On: 11/29/2018 14:02   Ct Head Wo Contrast  Result Date: 11/21/2018 CLINICAL DATA:  Altered level of consciousness EXAM: CT HEAD WITHOUT CONTRAST TECHNIQUE: Contiguous axial images were obtained from the base of the skull through the vertex without intravenous contrast. COMPARISON:  09/07/2014 FINDINGS: Brain: No evidence of acute infarction, hemorrhage, hydrocephalus, extra-axial collection or mass lesion/mass effect. Mild subcortical white matter and periventricular small vessel ischemic changes. Vascular: No hyperdense vessel or unexpected calcification. Skull: Normal. Negative for fracture or focal lesion. Sinuses/Orbits: The visualized paranasal sinuses are essentially clear. The mastoid air cells are unopacified. Other: None. IMPRESSION: No evidence of acute intracranial abnormality. Mild small vessel ischemic changes. Electronically Signed   By: Charline Bills M.D.   On: 11/21/2018 15:17   Ct Angio Chest Pe W Or Wo Contrast  Result Date: 12/02/2018 CLINICAL DATA:  Chest pain and shortness of breath. EXAM: CT ANGIOGRAPHY CHEST WITH CONTRAST TECHNIQUE: Multidetector CT imaging of the chest was performed using the standard protocol during bolus administration of intravenous contrast. Multiplanar CT image reconstructions and MIPs were obtained to evaluate the vascular anatomy. CONTRAST:  ISOVUE-370 IOPAMIDOL (ISOVUE-370) INJECTION 76% COMPARISON:  Chest x-ray 11/29/2018 FINDINGS: Cardiovascular: The heart is mildly enlarged. Evidence of significant right heart strain with markedly abnormal RV LV ratio. The aorta is normal in caliber. No dissection. No atherosclerotic calcifications. The branch vessels are patent. No definite coronary artery calcifications. The pulmonary arterial tree is fairly well opacified. There is sub massive bilateral pulmonary emboli with saddle emboli in the main right pulmonary artery.  This is causing significant right heart strain with a dilated right ventricle and mass effect on the left ventricle. Mediastinum/Nodes: No mediastinal or hilar mass or adenopathy. The esophagus is grossly normal. Lungs/Pleura: There are moderate-sized bilateral pleural effusions with overlying atelectasis. No obvious pulmonary infarcts. Upper Abdomen: The upper abdomen is unremarkable. Musculoskeletal: No significant bony findings. Review of the MIP images confirms the above findings. IMPRESSION: 1. Positive for acute sub massive PE with CT evidence of right heart strain (RV/LV Ratio = 1.6) consistent with at least submassive (intermediate risk) PE. The presence of right heart strain has been associated with an increased risk of morbidity and mortality. Please activate Code PE by paging [phone]. 2. Bilateral pleural effusions and bibasilar atelectasis. 3. Normal thoracic aorta. These results were called by telephone at the time of interpretation on 12/02/2018 at 9:39 am to Dr. Marinda Elk , who verbally acknowledged these results. Electronically Signed   By: Rudie Meyer M.D.   On: 12/02/2018 09:40   Ct Cervical Spine Wo Contrast  Result Date: 11/29/2018 CLINICAL DATA:  63 year old female with history of fall and apparent loss of consciousness. Syncopal event. EXAM: CT HEAD WITHOUT CONTRAST CT CERVICAL SPINE WITHOUT CONTRAST TECHNIQUE: Multidetector CT imaging of the head and cervical spine was performed following the standard protocol without intravenous contrast. Multiplanar CT image reconstructions of the cervical spine were also generated. COMPARISON:  None. FINDINGS: CT HEAD FINDINGS Brain: Patchy and confluent areas of decreased attenuation are noted  throughout the deep and periventricular white matter of the cerebral hemispheres bilaterally, compatible with chronic microvascular ischemic disease. Mild physiologic calcifications in the basal ganglia bilaterally no evidence of acute infarction,  hemorrhage, hydrocephalus, extra-axial collection or mass lesion/mass effect. Vascular: No hyperdense vessel or unexpected calcification. Skull: Normal. Negative for fracture or focal lesion. Sinuses/Orbits: No acute finding. Other: None. CT CERVICAL SPINE FINDINGS Alignment: Normal. Skull base and vertebrae: No acute fracture. No primary bone lesion or focal pathologic process. Soft tissues and spinal canal: No prevertebral fluid or swelling. No visible canal hematoma. Disc levels: Multilevel degenerative disc disease, most pronounced at C4-C5 and C5-C6. Mild multilevel facet arthropathy. Upper chest: Mild emphysematous changes. Other: Unremarkable. IMPRESSION: 1. No evidence of significant acute traumatic injury to the skull, brain or cervical spine. 2. Chronic microvascular ischemic changes in the cerebral white matter redemonstrated, as above. 3. Multilevel degenerative disc disease and cervical spondylosis, as above. Electronically Signed   By: Trudie Reed M.D.   On: 11/29/2018 14:02   Mr Brain Wo Contrast  Result Date: 12/04/2018 CLINICAL DATA:  63 y/o  F; acute psychosis. EXAM: MRI HEAD WITHOUT CONTRAST TECHNIQUE: Multiplanar, multiecho pulse sequences of the brain and surrounding structures were obtained without intravenous contrast. COMPARISON:  12/01/2017 CT head. FINDINGS: Brain: 5 mm focus of reduced diffusion within the right globus pallidus with increased T2 signal (series 4, image 25 and series 9, image 26). No associated hemorrhage or mass effect. Several nonspecific T2 FLAIR hyperintensities in subcortical and periventricular white matter are compatible with mild chronic microvascular ischemic changes for age. Mild volume loss of the brain. No extra-axial collection, hydrocephalus, mass effect, or herniation. Vascular: Normal flow voids. Skull and upper cervical spine: Normal marrow signal. Sinuses/Orbits: Mild mucosal thickening of the paranasal sinuses. No abnormal signal of the mastoid  air cells. Orbits are unremarkable. Other: None. IMPRESSION: 1. 5 mm focus of reduced diffusion within the right globus pallidus probably representing an acute/early subacute lacunar infarction. No associated hemorrhage or mass effect. 2. Mild chronic microvascular ischemic changes and volume loss of the brain. These results will be called to the ordering clinician or representative by the Radiologist Assistant, and communication documented in the PACS or zVision Dashboard. Electronically Signed   By: Mitzi Hansen M.D.   On: 12/04/2018 17:19   US Renal  Result Date: 11/29/2018 CLINICAL DATA:  Acute renal failure EXAM: RENAL / URINARY TRACT ULTRASOUND COMPLETE COMPARISON:  None. FINDINGS: Right Kidney: Renal measurements: 9.5 x 4.8 x 5.4 cm = volume: 128.5 mL . Echogenicity within normal limits. There is a 1 mm nonobstructing stone in the midpole right kidney. No mass or hydronephrosis visualized. Left Kidney: Renal measurements: 10 x 5.7 x 5.1 cm = volume: 151.9 mL. Echogenicity within normal limits. No mass or hydronephrosis visualized. Bladder: The bladder is decompressed limiting evaluation. IMPRESSION: No acute abnormality identified. 1 mm nonobstructing stone in midpole right kidney. No hydronephrosis is identified bilaterally. Electronically Signed   By: Sherian Rein M.D.   On: 11/29/2018 16:51   Dg Chest Port 1 View  Result Date: 11/29/2018 CLINICAL DATA:  Acute respiratory failure. EXAM: PORTABLE CHEST 1 VIEW COMPARISON:  11/22/2018 FINDINGS: Heart size is normal. Pulmonary vessels are slightly prominent and there is slight distention of the azygos vein, consistent with a supine position. No infiltrates or effusions. No acute bone abnormality. IMPRESSION: No active disease. Electronically Signed   By: Francene Boyers M.D.   On: 11/29/2018 17:31   Vas [place] Lower Extremity Venous (dvt)  Result Date: 12/01/2018  Lower Venous Study Indications: Pain, and Swelling.  Limitations: Body  habitus, line and bandages. Comparison Study: No prior study available Performing Technologist: Melodie Bouillon  Examination Guidelines: A complete evaluation includes B-mode imaging, spectral Doppler, color Doppler, and power Doppler as needed of all accessible portions of each vessel. Bilateral testing is considered an integral part of a complete examination. Limited examinations for reoccurring indications may be performed as noted.  Right Venous Findings: +---------+---------------+---------+-----------+----------+-------------------+          CompressibilityPhasicitySpontaneityPropertiesSummary             +---------+---------------+---------+-----------+----------+-------------------+ CFV      Full           Yes      Yes                  very pulsatile flow +---------+---------------+---------+-----------+----------+-------------------+ SFJ      Full                                                             +---------+---------------+---------+-----------+----------+-------------------+ FV Prox  Full                    Yes                  very poor flow                                                            detected            +---------+---------------+---------+-----------+----------+-------------------+ FV Mid   Full                                                             +---------+---------------+---------+-----------+----------+-------------------+ FV DistalFull                                         very poor flow,                                                           nearly absent       +---------+---------------+---------+-----------+----------+-------------------+ PFV      Full                                                             +---------+---------------+---------+-----------+----------+-------------------+ POP      Full           No  Yes                  very poor flow,                                                            nearly absent       +---------+---------------+---------+-----------+----------+-------------------+ PTV      None                    No                   Acute               +---------+---------------+---------+-----------+----------+-------------------+ PERO     None                    No                   Acute               +---------+---------------+---------+-----------+----------+-------------------+ GSV      Full                                                             +---------+---------------+---------+-----------+----------+-------------------+  Left Venous Findings: +---------+---------------+---------+-----------+----------+-------------------+          CompressibilityPhasicitySpontaneityPropertiesSummary             +---------+---------------+---------+-----------+----------+-------------------+ CFV      Full           Yes      Yes                                      +---------+---------------+---------+-----------+----------+-------------------+ SFJ      Full                                                             +---------+---------------+---------+-----------+----------+-------------------+ FV Prox  Full                                                             +---------+---------------+---------+-----------+----------+-------------------+ FV Mid   Full                                                             +---------+---------------+---------+-----------+----------+-------------------+ FV DistalFull                                                             +---------+---------------+---------+-----------+----------+-------------------+  PFV      Full                                                             +---------+---------------+---------+-----------+----------+-------------------+ POP      Full                    Yes                  very poor flow,                                                            nearly absent       +---------+---------------+---------+-----------+----------+-------------------+ PTV      None                    No                   Acute               +---------+---------------+---------+-----------+----------+-------------------+ PERO     None                    No                   Acute               +---------+---------------+---------+-----------+----------+-------------------+    Summary: Right: Findings consistent with acute deep vein thrombosis involving the right peroneal vein, and right posterior tibial vein. No cystic structure found in the popliteal fossa. Left: Findings consistent with acute deep vein thrombosis involving the left posterior tibial vein, and left peroneal vein. No cystic structure found in the popliteal fossa.  *See table(s) above for measurements and observations. Electronically signed by Gretta Beganodd Early MD on 12/01/2018 at 5:50:31 PM.    Final     Assessment: 63 y.o. female with PMH of HTN, arrhythmia admitted for syncope and hypotension. Found to have submassive PE with right heart strain. She was also found to have bilateral DVT. Now on Xarelto. EEG was normal on 11/29/18. MRI showed right GP small stroke. She had irregular heart beat in 2008 not sure "Aflutter" or "afib" but not sure. Not on any specific medication.   Pt currently had PE and DVT which is unprovoked, on Xarelto. Likely she will be on this life long given it is unprovoked with severe PE. This is also ideal for her potential "afib" or "aflutter", no further heart monitoring required. Since pt likely to be on long term anticoagulation, PFO evaluation is also not needed as it will not affect treatment.   Stroke Risk Factors - DVT, PE and possible afib afltter  Plan: - HgbA1c, fasting lipid panel - CTA head and neck - PT consult, OT consult, Speech consult - Prophylactic therapy- Xarelto - recommend life long mentioned as above -  Risk factor modification - Telemetry monitoring - Frequent neuro checks  Thank you for this consultation and allowing us to participate in the care of this patient. Will follow  Marvel PlanJindong Kalon Erhardt, MD PhD Stroke Neurology 12/05/2018 6:10 PM

## 2018-12-06 LAB — CULTURE, BLOOD (ROUTINE X 2)
Culture: NO GROWTH
Culture: NO GROWTH
Special Requests: ADEQUATE
Special Requests: ADEQUATE

## 2018-12-06 LAB — LIPID PANEL
Cholesterol: 180 mg/dL (ref 0–200)
HDL: 42 mg/dL (ref >40)
LDL Cholesterol: 122 mg/dL — ABNORMAL HIGH (ref 0–99)
Total CHOL/HDL Ratio: 4.3 RATIO
Triglycerides: 81 mg/dL (ref <150)
VLDL: 16 mg/dL (ref 0–40)

## 2018-12-06 LAB — HEMOGLOBIN A1C
Hgb A1c MFr Bld: 6.2 % — ABNORMAL HIGH (ref 4.8–5.6)
Mean Plasma Glucose: 131.24 mg/dL

## 2018-12-06 MED ORDER — ATORVASTATIN CALCIUM 20 MG PO TABS
20.0000 mg | ORAL_TABLET | Freq: Every day | ORAL | Status: DC
  Administered 2018-12-07: 20 mg via ORAL
  Filled 2018-12-06: qty 1

## 2018-12-06 NOTE — Progress Notes (Signed)
STROKE TEAM PROGRESS NOTE   SUBJECTIVE (INTERVAL HISTORY) No family is at the bedside.  Overall her condition is stable. She is sleeping in bed, easily arousable and AAO x3. She requested that something to written in her discharge note to help her housing condition. I told her to discuss with Dr. Robb Matar.    OBJECTIVE Temp:  [97.7 F (36.5 C)-99.5 F (37.5 C)] 98.8 F (37.1 C) (01/07 0403) Pulse Rate:  [83-115] 83 (01/07 0403) Cardiac Rhythm: Sinus tachycardia (01/06 1900) Resp:  [14-22] 22 (01/06 1921) BP: (121-151)/(75-93) 121/75 (01/07 0403) SpO2:  [96 %-100 %] 96 % (01/07 0403) Weight:  [66.3 kg] 66.3 kg (01/07 0437)  Recent Labs  Lab 12/02/18 0812 12/02/18 1014 12/02/18 1054 12/02/18 1226 12/02/18 1704  GLUCAP 99 66* 87 112* 106*   Recent Labs  Lab 11/29/18 1416 11/29/18 1905 11/30/18 0313  12/01/18 0856 12/02/18 0612 12/03/18 0512 12/04/18 0531 12/05/18 0508  NA 141 140 136   < > 138 142 147* 143 140  K 3.7 4.3 4.8   < > 4.4 3.0* 3.4* 3.5 3.4*  CL 102 105 108   < > 101 105 110 107 107  CO2 23 19* 15*   < > 29 27 28 26 25   GLUCOSE 136* 238* 289*   < > 182* 91 95 85 95  BUN 63* 57* 51*   < > 34* 15 8 5* 8  CREATININE 7.91* 6.77* 4.44*   < > 1.15* 0.73 0.70 0.67 0.81  CALCIUM 8.6* 7.8* 7.3*   < > 5.9* 7.8* 8.2* 8.5* 8.3*  MG 2.4 2.0 1.8  --  1.3* 2.2  --   --   --   PHOS  --   --  4.8*  --   --  1.7*  --   --   --    < > = values in this interval not displayed.   Recent Labs  Lab 11/29/18 1416  AST 25  ALT 14  ALKPHOS 51  BILITOT 1.1  PROT 6.9  ALBUMIN 3.5   Recent Labs  Lab 11/29/18 1133  12/01/18 0856 12/02/18 0309 12/03/18 0512 12/04/18 0531 12/05/18 0508  WBC 8.3   < > 12.5* 9.8 8.8 11.9* 14.1*  NEUTROABS 5.8  --   --   --   --   --   --   HGB 12.8   < > 10.7* 11.5* 12.1 13.8 12.2  HCT 40.4   < > 33.0* 36.8 39.2 44.9 38.6  MCV 84.5   < > 84.8 85.2 84.5 85.5 85.6  PLT 175   < > 114* 139* 209 223 213   < > = values in this interval not  displayed.   Recent Labs  Lab 11/30/18 0313  CKTOTAL 109  CKMB 10.3*   No results for input(s): LABPROT, INR in the last 72 hours. No results for input(s): COLORURINE, LABSPEC, PHURINE, GLUCOSEU, HGBUR, BILIRUBINUR, KETONESUR, PROTEINUR, UROBILINOGEN, NITRITE, LEUKOCYTESUR in the last 72 hours.  Invalid input(s): APPERANCEUR     Component Value Date/Time   CHOL 180 12/06/2018 0441   TRIG 81 12/06/2018 0441   HDL 42 12/06/2018 0441   CHOLHDL 4.3 12/06/2018 0441   VLDL 16 12/06/2018 0441   LDLCALC 122 (H) 12/06/2018 0441   Lab Results  Component Value Date   HGBA1C 6.2 (H) 12/06/2018      Component Value Date/Time   LABOPIA NONE DETECTED 11/29/2018 1542   COCAINSCRNUR NONE DETECTED 11/29/2018 1542   LABBENZ NONE DETECTED  11/29/2018 1542   AMPHETMU NONE DETECTED 11/29/2018 1542   THCU NONE DETECTED 11/29/2018 1542   LABBARB NONE DETECTED 11/29/2018 1542    No results for input(s): ETH in the last 168 hours.  I have personally reviewed the radiological images below and agree with the radiology interpretations.  Ct Angio Head W Or Wo Contrast  Result Date: 12/05/2018 CLINICAL DATA:  Follow-up examination for acute stroke. EXAM: CT ANGIOGRAPHY HEAD AND NECK TECHNIQUE: Multidetector CT imaging of the head and neck was performed using the standard protocol during bolus administration of intravenous contrast. Multiplanar CT image reconstructions and MIPs were obtained to evaluate the vascular anatomy. Carotid stenosis measurements (when applicable) are obtained utilizing NASCET criteria, using the distal internal carotid diameter as the denominator. CONTRAST:  100mL ISOVUE-370 IOPAMIDOL (ISOVUE-370) INJECTION 76% COMPARISON:  Prior MRI from earlier the same day as well as prior CT from 12/01/2018. FINDINGS: CT HEAD FINDINGS Brain: Atrophy with chronic microvascular ischemic disease, stable. Previously identified 5 mm right basal ganglia infarct not well seen by CT. No other acute large  vessel territory infarct. No acute intracranial hemorrhage. No mass lesion, midline shift or mass effect. No hydrocephalus. No extra-axial fluid collection. Vascular: No hyperdense vessel. Scattered vascular calcifications noted within the carotid siphons. Skull: Scalp soft tissues and calvarium within normal limits. Sinuses: Paranasal sinuses and mastoid air cells are clear. Orbits: Globes and orbital soft tissues demonstrate no acute finding. CTA NECK FINDINGS Aortic arch: Visualized aortic arch of normal caliber with normal 3 vessel morphology. No flow-limiting stenosis about the origin of the great vessels. Visualized subclavian arteries widely patent. Right carotid system: Right common carotid artery patent from its origin to the bifurcation without stenosis. Eccentric calcified plaque at the origin of the right ICA with relatively mild no more than 25-30% narrowing by NASCET criteria. Right ICA widely patent distally to the skull base without stenosis, dissection, or occlusion. Left carotid system: Left common and internal carotid arteries mildly tortuous but widely patent to the skull base without stenosis, dissection, or occlusion. No significant atheromatous narrowing about the left carotid bifurcation. Vertebral arteries: Both of the vertebral arteries arise from the subclavian arteries. Vertebral arteries widely patent within the neck without stenosis, dissection, or occlusion. Skeleton: No acute osseous finding. No discrete lytic or blastic osseous lesions. Mild cervical spondylolysis at C4-5 and C5-6. Other neck: No other acute soft tissue abnormality within the neck. No adenopathy. Salivary glands within normal limits. Thyroid within normal limits. Upper chest: Visualized upper chest demonstrates no acute finding. Visualized lungs are clear. Review of the MIP images confirms the above findings CTA HEAD FINDINGS Anterior circulation: Petrous segments widely patent bilaterally. Scattered atheromatous  plaque within the cavernous/supraclinoid ICAs bilaterally. Associated short-segment moderate to advanced approximately 50-70% stenosis at the anterior genu of the cavernous right ICA. No significant narrowing seen on the left. ICA termini well perfused bilaterally. A1 segments widely patent. Normal anterior communicating artery. Anterior cerebral arteries widely patent to their distal aspects without stenosis. M1 segments demonstrate mild irregularity without high-grade stenosis. No proximal M2 occlusion. Distal MCA branches well perfused and symmetric. Posterior circulation: Vertebral arteries widely patent to the vertebrobasilar junction without stenosis. Left vertebral artery slightly dominant. Posterior inferior cerebral arteries patent bilaterally. Basilar widely patent to its distal aspect without stenosis. Superior cerebral arteries patent bilaterally. Both of the posterior cerebral arteries primarily supplied via the basilar. Atheromatous irregularity throughout the PCAs without high-grade flow-limiting stenosis. Venous sinuses: Patent. Anatomic variants: None significant. Delayed phase: No abnormal  enhancement. Review of the MIP images confirms the above findings IMPRESSION: CT HEAD IMPRESSION 1. Stable head CT with no acute intracranial abnormality identified. Recently identified small right basal ganglia infarct not well visualized by CT. 2. No other new acute intracranial abnormality. CTA HEAD AND NECK IMPRESSION 1. Negative CTA for large vessel occlusion. 2. Atheromatous irregularity throughout the carotid siphons with short-segment moderate to advanced cavernous right ICA stenosis. 3. Atheromatous plaque about the right carotid bifurcation with no more than mild 30% stenosis. 4. Additional mild multifocal atheromatous irregularity throughout the intracranial circulation. No other high-grade or correctable stenosis identified. Electronically Signed   By: Rise Mu M.D.   On: 12/05/2018 22:55    Dg Chest 2 View  Result Date: 11/22/2018 CLINICAL DATA:  Altered mental status. EXAM: CHEST - 2 VIEW COMPARISON:  07/27/2014. FINDINGS: Normal sized heart. Clear lungs. Mild thoracic spine degenerative changes. IMPRESSION: No acute abnormality. Electronically Signed   By: Beckie Salts M.D.   On: 11/22/2018 11:51   Ct Head Wo Contrast  Result Date: 12/01/2018 CLINICAL DATA:  Encephalopathy, altered mental status. EXAM: CT HEAD WITHOUT CONTRAST TECHNIQUE: Contiguous axial images were obtained from the base of the skull through the vertex without intravenous contrast. COMPARISON:  CT scan of November 29, 2018. FINDINGS: Brain: Mild chronic ischemic white matter disease is noted. No mass effect or midline shift is noted. Ventricular size is within normal limits. There is no evidence of mass lesion, hemorrhage or acute infarction. Vascular: No hyperdense vessel or unexpected calcification. Skull: Normal. Negative for fracture or focal lesion. Sinuses/Orbits: No acute finding. Other: None. IMPRESSION: Mild chronic ischemic white matter disease. No acute intracranial abnormality seen. Electronically Signed   By: Lupita Raider, M.D.   On: 12/01/2018 14:49   Ct Head Wo Contrast  Result Date: 11/29/2018 CLINICAL DATA:  63 year old female with history of fall and apparent loss of consciousness. Syncopal event. EXAM: CT HEAD WITHOUT CONTRAST CT CERVICAL SPINE WITHOUT CONTRAST TECHNIQUE: Multidetector CT imaging of the head and cervical spine was performed following the standard protocol without intravenous contrast. Multiplanar CT image reconstructions of the cervical spine were also generated. COMPARISON:  None. FINDINGS: CT HEAD FINDINGS Brain: Patchy and confluent areas of decreased attenuation are noted throughout the deep and periventricular white matter of the cerebral hemispheres bilaterally, compatible with chronic microvascular ischemic disease. Mild physiologic calcifications in the basal ganglia  bilaterally no evidence of acute infarction, hemorrhage, hydrocephalus, extra-axial collection or mass lesion/mass effect. Vascular: No hyperdense vessel or unexpected calcification. Skull: Normal. Negative for fracture or focal lesion. Sinuses/Orbits: No acute finding. Other: None. CT CERVICAL SPINE FINDINGS Alignment: Normal. Skull base and vertebrae: No acute fracture. No primary bone lesion or focal pathologic process. Soft tissues and spinal canal: No prevertebral fluid or swelling. No visible canal hematoma. Disc levels: Multilevel degenerative disc disease, most pronounced at C4-C5 and C5-C6. Mild multilevel facet arthropathy. Upper chest: Mild emphysematous changes. Other: Unremarkable. IMPRESSION: 1. No evidence of significant acute traumatic injury to the skull, brain or cervical spine. 2. Chronic microvascular ischemic changes in the cerebral white matter redemonstrated, as above. 3. Multilevel degenerative disc disease and cervical spondylosis, as above. Electronically Signed   By: Trudie Reed M.D.   On: 11/29/2018 14:02   Ct Head Wo Contrast  Result Date: 11/21/2018 CLINICAL DATA:  Altered level of consciousness EXAM: CT HEAD WITHOUT CONTRAST TECHNIQUE: Contiguous axial images were obtained from the base of the skull through the vertex without intravenous contrast. COMPARISON:  09/07/2014  FINDINGS: Brain: No evidence of acute infarction, hemorrhage, hydrocephalus, extra-axial collection or mass lesion/mass effect. Mild subcortical white matter and periventricular small vessel ischemic changes. Vascular: No hyperdense vessel or unexpected calcification. Skull: Normal. Negative for fracture or focal lesion. Sinuses/Orbits: The visualized paranasal sinuses are essentially clear. The mastoid air cells are unopacified. Other: None. IMPRESSION: No evidence of acute intracranial abnormality. Mild small vessel ischemic changes. Electronically Signed   By: Charline BillsSriyesh  Krishnan M.D.   On: 11/21/2018 15:17    Ct Angio Neck W Or Wo Contrast  Result Date: 12/05/2018 CLINICAL DATA:  Follow-up examination for acute stroke. EXAM: CT ANGIOGRAPHY HEAD AND NECK TECHNIQUE: Multidetector CT imaging of the head and neck was performed using the standard protocol during bolus administration of intravenous contrast. Multiplanar CT image reconstructions and MIPs were obtained to evaluate the vascular anatomy. Carotid stenosis measurements (when applicable) are obtained utilizing NASCET criteria, using the distal internal carotid diameter as the denominator. CONTRAST:  100mL ISOVUE-370 IOPAMIDOL (ISOVUE-370) INJECTION 76% COMPARISON:  Prior MRI from earlier the same day as well as prior CT from 12/01/2018. FINDINGS: CT HEAD FINDINGS Brain: Atrophy with chronic microvascular ischemic disease, stable. Previously identified 5 mm right basal ganglia infarct not well seen by CT. No other acute large vessel territory infarct. No acute intracranial hemorrhage. No mass lesion, midline shift or mass effect. No hydrocephalus. No extra-axial fluid collection. Vascular: No hyperdense vessel. Scattered vascular calcifications noted within the carotid siphons. Skull: Scalp soft tissues and calvarium within normal limits. Sinuses: Paranasal sinuses and mastoid air cells are clear. Orbits: Globes and orbital soft tissues demonstrate no acute finding. CTA NECK FINDINGS Aortic arch: Visualized aortic arch of normal caliber with normal 3 vessel morphology. No flow-limiting stenosis about the origin of the great vessels. Visualized subclavian arteries widely patent. Right carotid system: Right common carotid artery patent from its origin to the bifurcation without stenosis. Eccentric calcified plaque at the origin of the right ICA with relatively mild no more than 25-30% narrowing by NASCET criteria. Right ICA widely patent distally to the skull base without stenosis, dissection, or occlusion. Left carotid system: Left common and internal carotid  arteries mildly tortuous but widely patent to the skull base without stenosis, dissection, or occlusion. No significant atheromatous narrowing about the left carotid bifurcation. Vertebral arteries: Both of the vertebral arteries arise from the subclavian arteries. Vertebral arteries widely patent within the neck without stenosis, dissection, or occlusion. Skeleton: No acute osseous finding. No discrete lytic or blastic osseous lesions. Mild cervical spondylolysis at C4-5 and C5-6. Other neck: No other acute soft tissue abnormality within the neck. No adenopathy. Salivary glands within normal limits. Thyroid within normal limits. Upper chest: Visualized upper chest demonstrates no acute finding. Visualized lungs are clear. Review of the MIP images confirms the above findings CTA HEAD FINDINGS Anterior circulation: Petrous segments widely patent bilaterally. Scattered atheromatous plaque within the cavernous/supraclinoid ICAs bilaterally. Associated short-segment moderate to advanced approximately 50-70% stenosis at the anterior genu of the cavernous right ICA. No significant narrowing seen on the left. ICA termini well perfused bilaterally. A1 segments widely patent. Normal anterior communicating artery. Anterior cerebral arteries widely patent to their distal aspects without stenosis. M1 segments demonstrate mild irregularity without high-grade stenosis. No proximal M2 occlusion. Distal MCA branches well perfused and symmetric. Posterior circulation: Vertebral arteries widely patent to the vertebrobasilar junction without stenosis. Left vertebral artery slightly dominant. Posterior inferior cerebral arteries patent bilaterally. Basilar widely patent to its distal aspect without stenosis. Superior cerebral arteries patent  bilaterally. Both of the posterior cerebral arteries primarily supplied via the basilar. Atheromatous irregularity throughout the PCAs without high-grade flow-limiting stenosis. Venous sinuses:  Patent. Anatomic variants: None significant. Delayed phase: No abnormal enhancement. Review of the MIP images confirms the above findings IMPRESSION: CT HEAD IMPRESSION 1. Stable head CT with no acute intracranial abnormality identified. Recently identified small right basal ganglia infarct not well visualized by CT. 2. No other new acute intracranial abnormality. CTA HEAD AND NECK IMPRESSION 1. Negative CTA for large vessel occlusion. 2. Atheromatous irregularity throughout the carotid siphons with short-segment moderate to advanced cavernous right ICA stenosis. 3. Atheromatous plaque about the right carotid bifurcation with no more than mild 30% stenosis. 4. Additional mild multifocal atheromatous irregularity throughout the intracranial circulation. No other high-grade or correctable stenosis identified. Electronically Signed   By: Rise Mu M.D.   On: 12/05/2018 22:55   Ct Angio Chest Pe W Or Wo Contrast  Result Date: 12/02/2018 CLINICAL DATA:  Chest pain and shortness of breath. EXAM: CT ANGIOGRAPHY CHEST WITH CONTRAST TECHNIQUE: Multidetector CT imaging of the chest was performed using the standard protocol during bolus administration of intravenous contrast. Multiplanar CT image reconstructions and MIPs were obtained to evaluate the vascular anatomy. CONTRAST:  ISOVUE-370 IOPAMIDOL (ISOVUE-370) INJECTION 76% COMPARISON:  Chest x-ray 11/29/2018 FINDINGS: Cardiovascular: The heart is mildly enlarged. Evidence of significant right heart strain with markedly abnormal RV LV ratio. The aorta is normal in caliber. No dissection. No atherosclerotic calcifications. The branch vessels are patent. No definite coronary artery calcifications. The pulmonary arterial tree is fairly well opacified. There is sub massive bilateral pulmonary emboli with saddle emboli in the main right pulmonary artery. This is causing significant right heart strain with a dilated right ventricle and mass effect on the left  ventricle. Mediastinum/Nodes: No mediastinal or hilar mass or adenopathy. The esophagus is grossly normal. Lungs/Pleura: There are moderate-sized bilateral pleural effusions with overlying atelectasis. No obvious pulmonary infarcts. Upper Abdomen: The upper abdomen is unremarkable. Musculoskeletal: No significant bony findings. Review of the MIP images confirms the above findings. IMPRESSION: 1. Positive for acute sub massive PE with CT evidence of right heart strain (RV/LV Ratio = 1.6) consistent with at least submassive (intermediate risk) PE. The presence of right heart strain has been associated with an increased risk of morbidity and mortality. Please activate Code PE by paging [phone]. 2. Bilateral pleural effusions and bibasilar atelectasis. 3. Normal thoracic aorta. These results were called by telephone at the time of interpretation on 12/02/2018 at 9:39 am to Dr. Marinda Elk , who verbally acknowledged these results. Electronically Signed   By: Rudie Meyer M.D.   On: 12/02/2018 09:40   Ct Cervical Spine Wo Contrast  Result Date: 11/29/2018 CLINICAL DATA:  63 year old female with history of fall and apparent loss of consciousness. Syncopal event. EXAM: CT HEAD WITHOUT CONTRAST CT CERVICAL SPINE WITHOUT CONTRAST TECHNIQUE: Multidetector CT imaging of the head and cervical spine was performed following the standard protocol without intravenous contrast. Multiplanar CT image reconstructions of the cervical spine were also generated. COMPARISON:  None. FINDINGS: CT HEAD FINDINGS Brain: Patchy and confluent areas of decreased attenuation are noted throughout the deep and periventricular white matter of the cerebral hemispheres bilaterally, compatible with chronic microvascular ischemic disease. Mild physiologic calcifications in the basal ganglia bilaterally no evidence of acute infarction, hemorrhage, hydrocephalus, extra-axial collection or mass lesion/mass effect. Vascular: No hyperdense  vessel or unexpected calcification. Skull: Normal. Negative for fracture or focal lesion. Sinuses/Orbits: No  acute finding. Other: None. CT CERVICAL SPINE FINDINGS Alignment: Normal. Skull base and vertebrae: No acute fracture. No primary bone lesion or focal pathologic process. Soft tissues and spinal canal: No prevertebral fluid or swelling. No visible canal hematoma. Disc levels: Multilevel degenerative disc disease, most pronounced at C4-C5 and C5-C6. Mild multilevel facet arthropathy. Upper chest: Mild emphysematous changes. Other: Unremarkable. IMPRESSION: 1. No evidence of significant acute traumatic injury to the skull, brain or cervical spine. 2. Chronic microvascular ischemic changes in the cerebral white matter redemonstrated, as above. 3. Multilevel degenerative disc disease and cervical spondylosis, as above. Electronically Signed   By: Trudie Reed M.D.   On: 11/29/2018 14:02   Mr Brain Wo Contrast  Result Date: 12/04/2018 CLINICAL DATA:  63 y/o  F; acute psychosis. EXAM: MRI HEAD WITHOUT CONTRAST TECHNIQUE: Multiplanar, multiecho pulse sequences of the brain and surrounding structures were obtained without intravenous contrast. COMPARISON:  12/01/2017 CT head. FINDINGS: Brain: 5 mm focus of reduced diffusion within the right globus pallidus with increased T2 signal (series 4, image 25 and series 9, image 26). No associated hemorrhage or mass effect. Several nonspecific T2 FLAIR hyperintensities in subcortical and periventricular white matter are compatible with mild chronic microvascular ischemic changes for age. Mild volume loss of the brain. No extra-axial collection, hydrocephalus, mass effect, or herniation. Vascular: Normal flow voids. Skull and upper cervical spine: Normal marrow signal. Sinuses/Orbits: Mild mucosal thickening of the paranasal sinuses. No abnormal signal of the mastoid air cells. Orbits are unremarkable. Other: None. IMPRESSION: 1. 5 mm focus of reduced diffusion within  the right globus pallidus probably representing an acute/early subacute lacunar infarction. No associated hemorrhage or mass effect. 2. Mild chronic microvascular ischemic changes and volume loss of the brain. These results will be called to the ordering clinician or representative by the Radiologist Assistant, and communication documented in the PACS or zVision Dashboard. Electronically Signed   By: Mitzi Hansen M.D.   On: 12/04/2018 17:19   US Renal  Result Date: 11/29/2018 CLINICAL DATA:  Acute renal failure EXAM: RENAL / URINARY TRACT ULTRASOUND COMPLETE COMPARISON:  None. FINDINGS: Right Kidney: Renal measurements: 9.5 x 4.8 x 5.4 cm = volume: 128.5 mL . Echogenicity within normal limits. There is a 1 mm nonobstructing stone in the midpole right kidney. No mass or hydronephrosis visualized. Left Kidney: Renal measurements: 10 x 5.7 x 5.1 cm = volume: 151.9 mL. Echogenicity within normal limits. No mass or hydronephrosis visualized. Bladder: The bladder is decompressed limiting evaluation. IMPRESSION: No acute abnormality identified. 1 mm nonobstructing stone in midpole right kidney. No hydronephrosis is identified bilaterally. Electronically Signed   By: Sherian Rein M.D.   On: 11/29/2018 16:51   Dg Chest Port 1 View  Result Date: 11/29/2018 CLINICAL DATA:  Acute respiratory failure. EXAM: PORTABLE CHEST 1 VIEW COMPARISON:  11/22/2018 FINDINGS: Heart size is normal. Pulmonary vessels are slightly prominent and there is slight distention of the azygos vein, consistent with a supine position. No infiltrates or effusions. No acute bone abnormality. IMPRESSION: No active disease. Electronically Signed   By: Francene Boyers M.D.   On: 11/29/2018 17:31   Vas [place] Lower Extremity Venous (dvt)  Result Date: 12/01/2018  Lower Venous Study Indications: Pain, and Swelling.  Limitations: Body habitus, line and bandages. Comparison Study: No prior study available Performing Technologist: Melodie Bouillon   Examination Guidelines: A complete evaluation includes B-mode imaging, spectral Doppler, color Doppler, and power Doppler as needed of all accessible portions of each vessel. Bilateral testing  is considered an integral part of a complete examination. Limited examinations for reoccurring indications may be performed as noted.  Right Venous Findings: +---------+---------------+---------+-----------+----------+-------------------+          CompressibilityPhasicitySpontaneityPropertiesSummary             +---------+---------------+---------+-----------+----------+-------------------+ CFV      Full           Yes      Yes                  very pulsatile flow +---------+---------------+---------+-----------+----------+-------------------+ SFJ      Full                                                             +---------+---------------+---------+-----------+----------+-------------------+ FV Prox  Full                    Yes                  very poor flow                                                            detected            +---------+---------------+---------+-----------+----------+-------------------+ FV Mid   Full                                                             +---------+---------------+---------+-----------+----------+-------------------+ FV DistalFull                                         very poor flow,                                                           nearly absent       +---------+---------------+---------+-----------+----------+-------------------+ PFV      Full                                                             +---------+---------------+---------+-----------+----------+-------------------+ POP      Full           No       Yes                  very poor flow,  nearly absent        +---------+---------------+---------+-----------+----------+-------------------+ PTV      None                    No                   Acute               +---------+---------------+---------+-----------+----------+-------------------+ PERO     None                    No                   Acute               +---------+---------------+---------+-----------+----------+-------------------+ GSV      Full                                                             +---------+---------------+---------+-----------+----------+-------------------+  Left Venous Findings: +---------+---------------+---------+-----------+----------+-------------------+          CompressibilityPhasicitySpontaneityPropertiesSummary             +---------+---------------+---------+-----------+----------+-------------------+ CFV      Full           Yes      Yes                                      +---------+---------------+---------+-----------+----------+-------------------+ SFJ      Full                                                             +---------+---------------+---------+-----------+----------+-------------------+ FV Prox  Full                                                             +---------+---------------+---------+-----------+----------+-------------------+ FV Mid   Full                                                             +---------+---------------+---------+-----------+----------+-------------------+ FV DistalFull                                                             +---------+---------------+---------+-----------+----------+-------------------+ PFV      Full                                                             +---------+---------------+---------+-----------+----------+-------------------+  POP      Full                    Yes                  very poor flow,                                                            nearly absent       +---------+---------------+---------+-----------+----------+-------------------+ PTV      None                    No                   Acute               +---------+---------------+---------+-----------+----------+-------------------+ PERO     None                    No                   Acute               +---------+---------------+---------+-----------+----------+-------------------+    Summary: Right: Findings consistent with acute deep vein thrombosis involving the right peroneal vein, and right posterior tibial vein. No cystic structure found in the popliteal fossa. Left: Findings consistent with acute deep vein thrombosis involving the left posterior tibial vein, and left peroneal vein. No cystic structure found in the popliteal fossa.  *See table(s) above for measurements and observations. Electronically signed by Gretta Began MD on 12/01/2018 at 5:50:31 PM.    Final     PHYSICAL EXAM  Temp:  [97.7 F (36.5 C)-99.5 F (37.5 C)] 98.8 F (37.1 C) (01/07 0403) Pulse Rate:  [83-115] 83 (01/07 0403) Resp:  [14-22] 22 (01/06 1921) BP: (121-151)/(75-93) 121/75 (01/07 0403) SpO2:  [96 %-100 %] 96 % (01/07 0403) Weight:  [66.3 kg] 66.3 kg (01/07 0437)  General - well nourished, well developed, in no apparent distress.    Ophthalmologic - fundi not visualized due to noncooperation.    Cardiovascular - regular rate and rhythm  Mental Status -  Level of arousal and orientation to time, place, and person were intact. Language including expression, repetition, comprehension, reading, and writing was assessed and found intact. Naming 3/4 Attention span and concentration were impaired, not able backspell WORLD  Cranial Nerves II - XII - II - Vision intact OU. III, IV, VI - Extraocular movements intact. V - Facial sensation intact bilaterally. VII - mild left facial droop. VIII - Hearing & vestibular intact bilaterally. X - Palate elevates  symmetrically. XI - Chin turning & shoulder shrug intact bilaterally. XII - Tongue protrusion intact.  Motor Strength - The patient's strength was normal in all extremities and pronator drift was absent.   Motor Tone & Bulk - Muscle tone was assessed at the neck and appendages and was normal.  Bulk was normal and fasciculations were absent.   Reflexes - The patient's reflexes were normal in all extremities and she had no pathological reflexes.  Sensory - Light touch, temperature/pinprick were assessed and were normal.    Coordination - The patient had normal movements in the hands and feet with no ataxia or dysmetria.  Tremor was absent.  Gait and Station - deferred   ASSESSMENT/PLAN [name] is a 63 y.o. female with history of HTN, arrhythmia admitted for syncope and hypotension. Found to have submassive PE with right heart strain. She was also found to have bilateral DVT. Now on Xarelto. EEG was normal on 11/29/18. MRI showed right GP small stroke. No tPA given due to outside window.    Stroke:  right GP small infarct, likely secondary to small vessel disease source. Paradoxically emboli from PFO can not be excluded, however, no recommendation for further work up as it will not change management  Resultant back to neuro baseline  MRI  Right GP small infarct  CTA head and neck - right ICA siphon and bifurcation mild athero  LE venous doppler - b/l DVT  EEG negative for seizrue  2D Echo  11/30/18 - EF 65-70%, RV changes consistent with PE  2D echo with bubble 12/05/18 - no R to L shunt  LDL 122  HgbA1c 6.2  Xarelto for VTE prophylaxis  No antithrombotic prior to admission, now on Xarelto (rivaroxaban) daily. Given unprovoked DVT, serious location (PE), as well as possible previous diagnosis of afib, current stroke, recommend anticoagulation life long.   Patient counseled to be compliant with her antithrombotic medications  Ongoing aggressive stroke risk  factor management  Therapy recommendations:  Pending   Disposition:  Pending   Acute PE with acute bilateral DVT - CT Angio of the chest: Submassive PE, with right heart strain. - 2D echo RV change consistent with PE - 2D echo bubble study negative for PFO - likely to explain her syncope and hypotension on presentation - no need further work up for PFO as it will not change management - on Xarelto and tolerating well. Given unprovoked DVT, serious location (PE), as well as possible previous diagnosis of afib, current stroke, recommend anticoagulation life long.   Acute encephalopathy, resolved  No psych hx  Presented with paranoia   Likely due to PE related encephalopathy, less likely related small GP infarct but cannot rule out  Continue treat primary cause  ? Hx of afib or aflutter  Pt and fiance stated that she had syncope in 2008 summer and her BP was very high and while doing TTE she was found to have irregular heart beat. Fiance stated that they were told to have "Aflutter" or "afib" but not for sure. She was followed with [facility] for a while, but no specific medication prescribed.   No need further work up for afib or aflutter, as no change of management   Given unprovoked DVT, serious location (PE), as well as possible previous diagnosis of afib, current stroke, recommend anticoagulation life long.   Hypertension . Stable  Long term BP goal normotensive  Hyperlipidemia  Home meds:  none   LDL 122, goal < 70  Now on lipitor 20  Continue statin at discharge  Other Stroke Risk Factors  Advanced age  Other Active Problems  Leukocytosis   Hospital day # 7  Neurology will sign off. Please call with questions. Pt will follow up with stroke clinic NP at [facility] in about 4 weeks. Thanks for the consult.   Marvel Plan, MD PhD Stroke Neurology 12/06/2018 8:32 AM    To contact Stroke Continuity provider, please refer to [email or web address]. After hours, contact General  Neurology

## 2018-12-06 NOTE — Evaluation (Signed)
Physical Therapy Evaluation Patient Details Name: Stephanie Snyder MRN: [id number] DOB: 29-Mar-1956 Today's Date: 12/06/2018   History of Present Illness  Pt is a 63 year old female was admitted for syncope/hypotension/confusion.  Found to have submassive PE, bil DVTs and small globus palliadus stroke.    Clinical Impression  Pt admitted with above diagnosis. Pt currently with functional limitations due to the deficits listed below (see PT Problem List).  Pt will benefit from skilled PT to increase their independence and safety with mobility to allow discharge to the venue listed below.  Pt presents with deficits of strength, balance, and decreased mobility at this time.  Pt would benefit from SNF upon d/c.  Pt reports stairs at home and typically ambulatory without assistive device.     Follow Up Recommendations Supervision/Assistance - 24 hour;SNF    Equipment Recommendations  Rolling walker with 5" wheels    Recommendations for Other Services       Precautions / Restrictions Precautions Precautions: Fall      Mobility  Bed Mobility               General bed mobility comments: pt up in recliner  Transfers Overall transfer level: Needs assistance Equipment used: Rolling walker (2 wheeled) Transfers: Sit to/from Stand Sit to Stand: Min guard         General transfer comment: verbal cues for hand placement  Ambulation/Gait Ambulation/Gait assistance: Min guard Gait Distance (Feet): 120 Feet Assistive device: Rolling walker (2 wheeled) Gait Pattern/deviations: Step-through pattern;Trunk flexed;Decreased stride length     General Gait Details: verbal cues for RW positioning, posture  Stairs            Wheelchair Mobility    Modified Rankin (Stroke Patients Only)       Balance Overall balance assessment: Needs assistance         Standing balance support: Bilateral upper extremity supported Standing balance-Leahy Scale: Poor Standing balance  comment: static standing fair without UE support, dynamic is poor and requires at least UE assist               High Level Balance Comments: pt unable to perform SLR on either LE without UE support, and hold was approx 15 sec             Pertinent Vitals/Pain Pain Assessment: No/denies pain Pain Score: 0-No pain    Home Living Family/patient expects to be discharged to:: Private residence Living Arrangements: Spouse/significant other   Type of Home: House Home Access: Stairs to enter   Secretary/administrator of Steps: 3-4 Home Layout: Two level Home Equipment: Cane - single point      Prior Function Level of Independence: Independent with assistive device(s)         Comments: Pt's husband is a Gaffer--? works Tax inspector        Extremity/Trunk Assessment   Upper Extremity Assessment Upper Extremity Assessment: Generalized weakness    Lower Extremity Assessment Lower Extremity Assessment: Generalized weakness;LLE deficits/detail LLE Deficits / Details: L LE overall weaker then R LE, grossly 4-/5       Communication   Communication: No difficulties  Cognition Arousal/Alertness: Awake/alert Behavior During Therapy: WFL for tasks assessed/performed                                   General Comments: decreased STM:  asked same questions repeatedly but was aware  that chair alarm was on.  Decreased awareness of balance deficits      General Comments      Exercises Other Exercises Other Exercises: gave pt level one theraband and squeeze ball:  she needs min cues for FF, elbow flexion exercises.  Independent with horizontal ABD.  Issued written HEP   Assessment/Plan    PT Assessment Patient needs continued PT services  PT Problem List Decreased strength;Decreased mobility;Decreased balance;Decreased knowledge of use of DME;Decreased safety awareness       PT Treatment Interventions DME instruction;Functional mobility  training;Gait training;Therapeutic activities;Neuromuscular re-education;Therapeutic exercise;Balance training;Patient/family education    PT Goals (Current goals can be found in the Care Plan section)  Acute Rehab PT Goals PT Goal Formulation: With patient Time For Goal Achievement: 12/13/18 Potential to Achieve Goals: Good    Frequency Min 3X/week   Barriers to discharge        Co-evaluation               AM-PAC PT "6 Clicks" Mobility  Outcome Measure Help needed turning from your back to your side while in a flat bed without using bedrails?: None Help needed moving from lying on your back to sitting on the side of a flat bed without using bedrails?: None Help needed moving to and from a bed to a chair (including a wheelchair)?: None Help needed standing up from a chair using your arms (e.g., wheelchair or bedside chair)?: A Little Help needed to walk in hospital room?: A Little Help needed climbing 3-5 steps with a railing? : A Little 6 Click Score: 21    End of Session Equipment Utilized During Treatment: Gait belt Activity Tolerance: Patient tolerated treatment well Patient left: in chair;with chair alarm set;with call bell/phone within reach Nurse Communication: Mobility status PT Visit Diagnosis: Other abnormalities of gait and mobility (R26.89)    Time: 2376-2831 PT Time Calculation (min) (ACUTE ONLY): 14 min   Charges:   PT Evaluation $PT Eval Low Complexity: 1 Low         Stephanie Snyder, PT, DPT Acute Rehabilitation Services Office: [phone] Pager: [phone]  Sarajane Jews 12/06/2018, 1:09 PM

## 2018-12-06 NOTE — Evaluation (Signed)
Speech Language Pathology Evaluation Patient Details Name: Stephanie Snyder MRN: [id number] DOB: Jun 23, 1956 Today's Date: 12/06/2018 Time: 1333-1400 SLP Time Calculation (min) (ACUTE ONLY): 27 min  Problem List:  Patient Active Problem List   Diagnosis Date Noted  . Toxic encephalopathy 12/02/2018  . DVT, lower extremity, distal, acute, bilateral (HCC) 12/02/2018  . AKI (acute kidney injury) (HCC) 12/02/2018  . Hypovolemic shock (HCC) 11/29/2018  . Acute psychosis (HCC)   . Screening for human immunodeficiency virus 03/02/2018  . Tricompartment degenerative joint disease of knee 06/22/2017  . Stress at home 01/04/2017  . Healthcare maintenance 11/20/2016  . Difficulty sleeping 11/20/2016  . Encounter for screening mammogram for breast cancer 05/15/2016  . Lumbar radiculopathy 05/15/2016  . Knee pain, chronic 02/28/2016  . Essential hypertension 02/14/2016  . Asthma 02/14/2016   Past Medical History:  Past Medical History:  Diagnosis Date  . Arthritis   . Asthma   . CVA (cerebral vascular accident) (HCC) 2008  . Hypertension   . Irregular heart beat 2015   Past Surgical History:  Past Surgical History:  Procedure Laterality Date  . TUBAL LIGATION  1984   HPI:  63 yo adm to [facility] with mental status, syncope, PE diagnosed with small right globus pallidus CVA.  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   She lives with her fiance who works full time - maintenance on the apartments.    Assessment / Plan / Recommendation Clinical Impression  Pt with mild left facial asymmetry but otherwise CN exam unremarkable.  Her speech is fluent but often off topic and verbose with some language of confusion.  Verbal (non written) portion of MOCA 7.1 administered to pt with her score being 11/25 - indicative of a severe cognitive deficit.  Pt demonstrated decreased sustained attention imparing her memory and ability to complete tasks.  She admits to premorbid memory  deficits and repeatedly stated that she "doesn't do this kind of thing anymore".  SLP questions impact of pt's psychological disorder history, polypharmacy and ? baseline cognition.  Educated pt to findings/recommendations and SLP concern for pt ability to manage her own medications, finances, etc.  Will follow up with pt and her fiance re: her needs, cognitive deficits.      SLP Assessment  SLP Recommendation/Assessment: Patient needs continued Speech Lanaguage Pathology Services SLP Visit Diagnosis: Attention and concentration deficit Attention and concentration deficit following: Cerebral infarction    Follow Up Recommendations  Skilled Nursing facility    Frequency and Duration min 1 x/week  1 week      SLP Evaluation Cognition  Overall Cognitive Status: No family/caregiver present to determine baseline cognitive functioning Arousal/Alertness: Awake/alert Orientation Level: Oriented to person;Oriented to place;Oriented to situation;Disoriented to time Attention: Sustained Sustained Attention: Impaired Sustained Attention Impairment: Verbal basic;Functional basic Memory: Impaired(0/5 words I, 2/5 with category cue, 3/5 not recalled despite mc) Memory Impairment: Storage deficit;Retrieval deficit Awareness: Impaired Awareness Impairment: Intellectual impairment Problem Solving: Impaired Problem Solving Impairment: Verbal complex Behaviors: Restless;Impulsive Safety/Judgment: Impaired Comments: pt states she can manage her own medications/bills, etc - later admitted test was more difficult than she would've suspected and advised she would ask fiance to help her       Comprehension  Auditory Comprehension Overall Auditory Comprehension: Appears within functional limits for tasks assessed Yes/No Questions: Not tested Commands: Impaired One Step Basic Commands: 75-100% accurate Two Step Basic Commands: 75-100% accurate Conversation: Simple Interfering Components:  Attention;Processing speed Reading Comprehension Reading Status: Not tested  Expression Expression Primary Mode of Expression: Verbal Verbal Expression Overall Verbal Expression: Appears within functional limits for tasks assessed Initiation: No impairment Level of Generative/Spontaneous Verbalization: Sentence Repetition: No impairment Naming: No impairment Pragmatics: Impairment Impairments: Topic maintenance;Turn Taking Interfering Components: Attention Non-Verbal Means of Communication: Not applicable Written Expression Dominant Hand: Right Written Expression: Not tested   Oral / Motor  Oral Motor/Sensory Function Overall Oral Motor/Sensory Function: Mild impairment Facial ROM: Reduced left Facial Symmetry: Abnormal symmetry left Facial Strength: Within Functional Limits Facial Sensation: Within Functional Limits Lingual ROM: Within Functional Limits Lingual Symmetry: Within Functional Limits Lingual Strength: Within Functional Limits Lingual Sensation: Within Functional Limits Velum: Within Functional Limits Motor Speech Overall Motor Speech: Appears within functional limits for tasks assessed Respiration: Within functional limits Phonation: Normal Resonance: Within functional limits Articulation: Within functional limitis Intelligibility: Intelligible Motor Planning: Witnin functional limits Motor Speech Errors: Not applicable   GO                   Donavan Burnetamara Ranson Belluomini, MS [facility] SLP Acute Rehab Services Pager [phone] Office [phone]  Chales AbrahamsKimball, Nakeesha Bowler Ann 12/06/2018, 4:10 PM

## 2018-12-06 NOTE — Progress Notes (Signed)
   12/06/18 1100  OT Visit Information  Last OT Received On 12/06/18  Assistance Needed +1  History of Present Illness this 63 year old female was admitted for syncope/hypotension/confusion.  Found to have submassive PE, bil DVTs and small globus palliadus stroke.    Precautions  Precautions Fall  Pain Assessment  Pain Score 0  Cognition  Arousal/Alertness Awake/alert  Behavior During Therapy WFL for tasks assessed/performed  Upper Extremity Assessment  Upper Extremity Assessment Generalized weakness  Exercises  Exercises Other exercises  Other Exercises  Other Exercises gave pt level one theraband and squeeze ball:  she needs min cues for FF, elbow flexion exercises.  Independent with horizontal ABD.  Issued written HEP  OT - End of Session  Activity Tolerance Patient tolerated treatment well  Patient left in chair;with call bell/phone within reach;with chair alarm set  OT Assessment/Plan  OT Visit Diagnosis Unsteadiness on feet (R26.81)  Follow Up Recommendations Supervision/Assistance - 24 hour;SNF (if home, HHOT)  OT Equipment  (likely none)  AM-PAC OT "6 Clicks" Daily Activity Outcome Measure (Version 2)  Help from another person eating meals? 4  Help from another person taking care of personal grooming? 3  Help from another person toileting, which includes using toliet, bedpan, or urinal? 3  Help from another person bathing (including washing, rinsing, drying)? 3  Help from another person to put on and taking off regular upper body clothing? 3  Help from another person to put on and taking off regular lower body clothing? 3  6 Click Score 19  OT Goal Progression  Progress towards OT goals Progressing toward goals  Acute Rehab OT Goals  Time For Goal Achievement 12/20/18  OT Time Calculation  OT Start Time (ACUTE ONLY) 1105  OT Stop Time (ACUTE ONLY) 1117  OT Time Calculation (min) 12 min  OT General Charges  $OT Visit 1 Visit  OT Treatments  $Therapeutic Exercise  8-22 mins  Marica Otter, OTR/L Acute Rehabilitation Services [phone] [facility] pager [phone] office 12/06/2018

## 2018-12-06 NOTE — Evaluation (Signed)
Occupational Therapy Evaluation Patient Details Name: Stephanie Snyder MRN: [id number] DOB: 01-Mar-1956 Today's Date: 12/06/2018    History of Present Illness this 63 year old female was admitted for syncope/hypotension/confusion.  Found to have submassive PE, bil DVTs and small globus palliadus stroke.     Clinical Impression   Pt was admitted for the above. She reports that she is mod I at baseline with adls/iadls. She currently needs mostly min guard assist, but she did have one LOB episode requiring steadying assistance to recover. Will follow in acute setting with supervison level goals.    Follow Up Recommendations  Supervision/Assistance - 24 hour;SNF;Home health OT(depending on progress/availability for help)    Equipment Recommendations  (likely none)    Recommendations for Other Services       Precautions / Restrictions Precautions Precautions: Fall Restrictions Weight Bearing Restrictions: No      Mobility Bed Mobility Overal bed mobility: Independent                Transfers Overall transfer level: Needs assistance Equipment used: Rolling walker (2 wheeled) Transfers: Sit to/from Stand Sit to Stand: Min guard         General transfer comment: for safety    Balance Overall balance assessment: Needs assistance   Sitting balance-Leahy Scale: Good       Standing balance-Leahy Scale: Poor Standing balance comment: dynamic; fair statically                           ADL either performed or assessed with clinical judgement   ADL Overall ADL's : Needs assistance/impaired Eating/Feeding: Independent   Grooming: Set up   Upper Body Bathing: Set up   Lower Body Bathing: Min guard;Sit to/from stand   Upper Body Dressing : Set up   Lower Body Dressing: Min guard;Sit to/from stand   Toilet Transfer: Minimal assistance;Ambulation;RW;Comfort height toilet             General ADL Comments: pt had one LOB when ambulating in room;  light support given to correct. Pt is used to cane; used RW     Financial controller      Pertinent Vitals/Pain Pain Assessment: 0-10 Pain Score: 8  Pain Location: back Pain Descriptors / Indicators: Aching Pain Intervention(s): Limited activity within patient's tolerance;Monitored during session;Premedicated before session;Repositioned     Hand Dominance     Extremity/Trunk Assessment Upper Extremity Assessment Upper Extremity Assessment: Generalized weakness; strength equal 3+/5           Communication Communication Communication: No difficulties   Cognition Arousal/Alertness: Lethargic Behavior During Therapy: WFL for tasks assessed/performed                                   General Comments: decreased STM:  asked same questions repeatedly but was aware that bed alarm was on.  Decreased awareness of balance deficits.  Pt states that sensation is not absent in UEs but feels different. She is interested in strengthening with ball and band. Will bring on next visit.   General Comments       Exercises     Shoulder Instructions      Home Living Family/patient expects to be discharged to:: Private residence Living Arrangements: Spouse/significant other  Bathroom Shower/Tub: Chief Strategy OfficerTub/shower unit   Bathroom Toilet: Standard     Home Equipment: Cane - single point          Prior Functioning/Environment Level of Independence: Independent with assistive device(s)        Comments: Pt's husband is a Gafferhandyman--? works FT        OT Problem List: Decreased strength;Decreased activity tolerance;Impaired balance (sitting and/or standing);Decreased cognition;Pain;Decreased knowledge of use of DME or AE      OT Treatment/Interventions: Self-care/ADL training;DME and/or AE instruction;Balance training;Patient/family education;Therapeutic activities;Therapeutic exercise    OT Goals(Current goals can be found in  the care plan section) Acute Rehab OT Goals Patient Stated Goal: none stated OT Goal Formulation: With patient Time For Goal Achievement: 12/20/18 Potential to Achieve Goals: Good ADL Goals Pt Will Transfer to Toilet: with supervision;regular height toilet;ambulating Pt Will Perform Tub/Shower Transfer: with min guard assist;Tub transfer;ambulating(? seat) Additional ADL Goal #1: pt will gather clothes at supervision level and complete ADL without supervision  OT Frequency: Min 2X/week   Barriers to D/C:            Co-evaluation              AM-PAC OT "6 Clicks" Daily Activity     Outcome Measure Help from another person eating meals?: None Help from another person taking care of personal grooming?: A Little Help from another person toileting, which includes using toliet, bedpan, or urinal?: A Little Help from another person bathing (including washing, rinsing, drying)?: A Little Help from another person to put on and taking off regular upper body clothing?: A Little Help from another person to put on and taking off regular lower body clothing?: A Little 6 Click Score: 19   End of Session    Activity Tolerance: Patient tolerated treatment well Patient left: in chair;with call bell/phone within reach;with chair alarm set  OT Visit Diagnosis: Unsteadiness on feet (R26.81)                Time: [id number] OT Time Calculation (min): 13 min Charges:  OT General Charges $OT Visit: 1 Visit OT Evaluation $OT Eval Low Complexity: 1 Low  Stephanie Snyder, OTR/L Acute Rehabilitation Services [phone] [facility] pager [phone] office 12/06/2018  Tomma Ehinger 12/06/2018, 9:06 AM

## 2018-12-06 NOTE — Progress Notes (Addendum)
[facility] PROGRESS NOTE    Progress Note  Stephanie Snyder  ZOX:[id number] DOB: 04-Mar-1956 DOA: 11/21/2018 PCP: Claudean Severance, MD     Brief Narrative:   Stephanie Snyder is an 63 y.o. female past medical history of psychiatric disorders admitted on 12/31 to the psychiatric holding area and being treated for acute psychosis felt to be exacerbated by UTI on 12/24, had a syncopal episode and becoming unresponsive and hypotensive, she was obtunded (with a baseline creatinine of less than 1) after his syncopal episode her basic metabolic panel was checked and showed a creatinine of 7, CT scan of the head was negative she was given gentle IV fluids and pressors PCCM was consulted admitted under PCCM intubated and put on pressors extubated and transferred [facility] 12/[facility]/2017.  1/1: Stable, weaning pressors, with hypoxia, ECHO with suspected PE, U/S with bilateral LE DVT. CT Head/C-Spine 12/31 > No evidence of significant acute traumatic injury to the skull, brain or cervical spine.  ECHO 1/1 > Left ventricle: The cavity size was normal. Wall thickness was normal. Systolic function was vigorous. The estimated ejection fraction was in the range of 65% to 70%. Wall motion was norma, grade 1 diastolic dysfunction. Right ventricle: The cavity size was moderately dilated.Systolic function was moderately reduced. Right atrium: The atrium was moderately dilated. Tricuspid valve: There was mild regurgitation. Pulmonary arteries: Systolic pressure was mildly increased. PA peak pressure: 43 mm Hg. Vas [place] 1/1 > Findings consistent with acute bilateral lower extremity DVT. CT Angie of the chest on 12/02/2018: Shows submassive PE with right heart strain. MRI of the brain on 12/04/2018 showed possible 5 mm we will padded a stroke on the right.  Assessment/Plan:   Hypovolemic shock (HCC): - She was fluid resuscitated, she is now off pressors. - Blood cultures 1/2 coag negative staph BCID coag negative  staph likely a contaminant. -She has remained afebrile for over 72 hrs, continue to hold antibiotics.  Acute PE with acute bilateral DVT: - CT Angio of the chest: Submassive PE, with right heart strain. - 2D echo showed normal systolic function right ventricle was moderately dilated and systolic function moderately reduced with right atrium dilation. - Vitals are stable, she needs a mamogram, pap smear and colonoscopy as an outpatient. -She is on Xarelto and tolerating it.  Acute confusional state/acute psychosis (HCC)/toxic metabolic encephalopathy: Likely multifactorial in the setting of polypharmacy, CVA and shock state. EEG completed on 11/29/2018 that was normal. Psych was consulted and she had no paranoid thoughts when she is more confused they are concerned about dementia. She will have to follow-up with her primary care doctor as an outpatient.  Acute kidney injury: Likely prerenal azotemia resolved with fluid resuscitation and pressors.  Anion gap metabolic acidosis: Likely due to hypovolemia now resolved.  New CVA of the globus pallidus: HgbA1c 6.2, fasting lipid panel  MRI, MRA 5 mm globus pallidus CVA. Patient will therapy evaluated the patient recommended skilled nursing facility versus 24-hour care at home. Physical therapy evaluation is pending. CT scan of the head and neck with contrast atheromatous irregularities throughout the carotid siphon on the right ICA, there was an atheromatous plaque on the carotid bifurcation with 30% stenosis and additional multifocal irregularities throughout the intracranial circulation. Echocardiogram with bubble studies show no left-to-right shunt She is on Xarelto. Patient neurology assistance awaiting further recommendations.  Leukocytosis: She had 1 out of 2 blood cultures positive for methicillin-resistant staph coag negative. Remain off antibiotics and has remained afebrile. Repeated blood  cultures have remained negative till  date.   DVT prophylaxis: Xarelto. Family Communication:**none Disposition Plan/Barrier to D/C: Skilled nursing facility hopefully tomorrow Code Status:     Code Status Orders  (From admission, onward)         Start     Ordered   11/29/18 1539  Full code  Continuous     11/29/18 1542        Code Status History    This patient has a current code status but no historical code status.        IV Access:    Peripheral IV   Procedures and diagnostic studies:   Ct Angio Head W Or Wo Contrast  Result Date: 12/05/2018 CLINICAL DATA:  Follow-up examination for acute stroke. EXAM: CT ANGIOGRAPHY HEAD AND NECK TECHNIQUE: Multidetector CT imaging of the head and neck was performed using the standard protocol during bolus administration of intravenous contrast. Multiplanar CT image reconstructions and MIPs were obtained to evaluate the vascular anatomy. Carotid stenosis measurements (when applicable) are obtained utilizing NASCET criteria, using the distal internal carotid diameter as the denominator. CONTRAST:  ISOVUE-370 IOPAMIDOL (ISOVUE-370) INJECTION 76% COMPARISON:  Prior MRI from earlier the same day as well as prior CT from 12/01/2018. FINDINGS: CT HEAD FINDINGS Brain: Atrophy with chronic microvascular ischemic disease, stable. Previously identified 5 mm right basal ganglia infarct not well seen by CT. No other acute large vessel territory infarct. No acute intracranial hemorrhage. No mass lesion, midline shift or mass effect. No hydrocephalus. No extra-axial fluid collection. Vascular: No hyperdense vessel. Scattered vascular calcifications noted within the carotid siphons. Skull: Scalp soft tissues and calvarium within normal limits. Sinuses: Paranasal sinuses and mastoid air cells are clear. Orbits: Globes and orbital soft tissues demonstrate no acute finding. CTA NECK FINDINGS Aortic arch: Visualized aortic arch of normal caliber with normal 3 vessel morphology. No  flow-limiting stenosis about the origin of the great vessels. Visualized subclavian arteries widely patent. Right carotid system: Right common carotid artery patent from its origin to the bifurcation without stenosis. Eccentric calcified plaque at the origin of the right ICA with relatively mild no more than 25-30% narrowing by NASCET criteria. Right ICA widely patent distally to the skull base without stenosis, dissection, or occlusion. Left carotid system: Left common and internal carotid arteries mildly tortuous but widely patent to the skull base without stenosis, dissection, or occlusion. No significant atheromatous narrowing about the left carotid bifurcation. Vertebral arteries: Both of the vertebral arteries arise from the subclavian arteries. Vertebral arteries widely patent within the neck without stenosis, dissection, or occlusion. Skeleton: No acute osseous finding. No discrete lytic or blastic osseous lesions. Mild cervical spondylolysis at C4-5 and C5-6. Other neck: No other acute soft tissue abnormality within the neck. No adenopathy. Salivary glands within normal limits. Thyroid within normal limits. Upper chest: Visualized upper chest demonstrates no acute finding. Visualized lungs are clear. Review of the MIP images confirms the above findings CTA HEAD FINDINGS Anterior circulation: Petrous segments widely patent bilaterally. Scattered atheromatous plaque within the cavernous/supraclinoid ICAs bilaterally. Associated short-segment moderate to advanced approximately 50-70% stenosis at the anterior genu of the cavernous right ICA. No significant narrowing seen on the left. ICA termini well perfused bilaterally. A1 segments widely patent. Normal anterior communicating artery. Anterior cerebral arteries widely patent to their distal aspects without stenosis. M1 segments demonstrate mild irregularity without high-grade stenosis. No proximal M2 occlusion. Distal MCA branches well perfused and symmetric.  Posterior circulation: Vertebral arteries widely patent to the  vertebrobasilar junction without stenosis. Left vertebral artery slightly dominant. Posterior inferior cerebral arteries patent bilaterally. Basilar widely patent to its distal aspect without stenosis. Superior cerebral arteries patent bilaterally. Both of the posterior cerebral arteries primarily supplied via the basilar. Atheromatous irregularity throughout the PCAs without high-grade flow-limiting stenosis. Venous sinuses: Patent. Anatomic variants: None significant. Delayed phase: No abnormal enhancement. Review of the MIP images confirms the above findings IMPRESSION: CT HEAD IMPRESSION 1. Stable head CT with no acute intracranial abnormality identified. Recently identified small right basal ganglia infarct not well visualized by CT. 2. No other new acute intracranial abnormality. CTA HEAD AND NECK IMPRESSION 1. Negative CTA for large vessel occlusion. 2. Atheromatous irregularity throughout the carotid siphons with short-segment moderate to advanced cavernous right ICA stenosis. 3. Atheromatous plaque about the right carotid bifurcation with no more than mild 30% stenosis. 4. Additional mild multifocal atheromatous irregularity throughout the intracranial circulation. No other high-grade or correctable stenosis identified. Electronically Signed   By: Rise Mu M.D.   On: 12/05/2018 22:55   Ct Angio Neck W Or Wo Contrast  Result Date: 12/05/2018 CLINICAL DATA:  Follow-up examination for acute stroke. EXAM: CT ANGIOGRAPHY HEAD AND NECK TECHNIQUE: Multidetector CT imaging of the head and neck was performed using the standard protocol during bolus administration of intravenous contrast. Multiplanar CT image reconstructions and MIPs were obtained to evaluate the vascular anatomy. Carotid stenosis measurements (when applicable) are obtained utilizing NASCET criteria, using the distal internal carotid diameter as the denominator. CONTRAST:   ISOVUE-370 IOPAMIDOL (ISOVUE-370) INJECTION 76% COMPARISON:  Prior MRI from earlier the same day as well as prior CT from 12/01/2018. FINDINGS: CT HEAD FINDINGS Brain: Atrophy with chronic microvascular ischemic disease, stable. Previously identified 5 mm right basal ganglia infarct not well seen by CT. No other acute large vessel territory infarct. No acute intracranial hemorrhage. No mass lesion, midline shift or mass effect. No hydrocephalus. No extra-axial fluid collection. Vascular: No hyperdense vessel. Scattered vascular calcifications noted within the carotid siphons. Skull: Scalp soft tissues and calvarium within normal limits. Sinuses: Paranasal sinuses and mastoid air cells are clear. Orbits: Globes and orbital soft tissues demonstrate no acute finding. CTA NECK FINDINGS Aortic arch: Visualized aortic arch of normal caliber with normal 3 vessel morphology. No flow-limiting stenosis about the origin of the great vessels. Visualized subclavian arteries widely patent. Right carotid system: Right common carotid artery patent from its origin to the bifurcation without stenosis. Eccentric calcified plaque at the origin of the right ICA with relatively mild no more than 25-30% narrowing by NASCET criteria. Right ICA widely patent distally to the skull base without stenosis, dissection, or occlusion. Left carotid system: Left common and internal carotid arteries mildly tortuous but widely patent to the skull base without stenosis, dissection, or occlusion. No significant atheromatous narrowing about the left carotid bifurcation. Vertebral arteries: Both of the vertebral arteries arise from the subclavian arteries. Vertebral arteries widely patent within the neck without stenosis, dissection, or occlusion. Skeleton: No acute osseous finding. No discrete lytic or blastic osseous lesions. Mild cervical spondylolysis at C4-5 and C5-6. Other neck: No other acute soft tissue abnormality within the neck. No  adenopathy. Salivary glands within normal limits. Thyroid within normal limits. Upper chest: Visualized upper chest demonstrates no acute finding. Visualized lungs are clear. Review of the MIP images confirms the above findings CTA HEAD FINDINGS Anterior circulation: Petrous segments widely patent bilaterally. Scattered atheromatous plaque within the cavernous/supraclinoid ICAs bilaterally. Associated short-segment moderate to advanced approximately 50-70% stenosis  at the anterior genu of the cavernous right ICA. No significant narrowing seen on the left. ICA termini well perfused bilaterally. A1 segments widely patent. Normal anterior communicating artery. Anterior cerebral arteries widely patent to their distal aspects without stenosis. M1 segments demonstrate mild irregularity without high-grade stenosis. No proximal M2 occlusion. Distal MCA branches well perfused and symmetric. Posterior circulation: Vertebral arteries widely patent to the vertebrobasilar junction without stenosis. Left vertebral artery slightly dominant. Posterior inferior cerebral arteries patent bilaterally. Basilar widely patent to its distal aspect without stenosis. Superior cerebral arteries patent bilaterally. Both of the posterior cerebral arteries primarily supplied via the basilar. Atheromatous irregularity throughout the PCAs without high-grade flow-limiting stenosis. Venous sinuses: Patent. Anatomic variants: None significant. Delayed phase: No abnormal enhancement. Review of the MIP images confirms the above findings IMPRESSION: CT HEAD IMPRESSION 1. Stable head CT with no acute intracranial abnormality identified. Recently identified small right basal ganglia infarct not well visualized by CT. 2. No other new acute intracranial abnormality. CTA HEAD AND NECK IMPRESSION 1. Negative CTA for large vessel occlusion. 2. Atheromatous irregularity throughout the carotid siphons with short-segment moderate to advanced cavernous right ICA  stenosis. 3. Atheromatous plaque about the right carotid bifurcation with no more than mild 30% stenosis. 4. Additional mild multifocal atheromatous irregularity throughout the intracranial circulation. No other high-grade or correctable stenosis identified. Electronically Signed   By: Rise Mu M.D.   On: 12/05/2018 22:55   Mr Brain Wo Contrast  Result Date: 12/04/2018 CLINICAL DATA:  63 y/o  F; acute psychosis. EXAM: MRI HEAD WITHOUT CONTRAST TECHNIQUE: Multiplanar, multiecho pulse sequences of the brain and surrounding structures were obtained without intravenous contrast. COMPARISON:  12/01/2017 CT head. FINDINGS: Brain: 5 mm focus of reduced diffusion within the right globus pallidus with increased T2 signal (series 4, image 25 and series 9, image 26). No associated hemorrhage or mass effect. Several nonspecific T2 FLAIR hyperintensities in subcortical and periventricular white matter are compatible with mild chronic microvascular ischemic changes for age. Mild volume loss of the brain. No extra-axial collection, hydrocephalus, mass effect, or herniation. Vascular: Normal flow voids. Skull and upper cervical spine: Normal marrow signal. Sinuses/Orbits: Mild mucosal thickening of the paranasal sinuses. No abnormal signal of the mastoid air cells. Orbits are unremarkable. Other: None. IMPRESSION: 1. 5 mm focus of reduced diffusion within the right globus pallidus probably representing an acute/early subacute lacunar infarction. No associated hemorrhage or mass effect. 2. Mild chronic microvascular ischemic changes and volume loss of the brain. These results will be called to the ordering clinician or representative by the Radiologist Assistant, and communication documented in the PACS or zVision Dashboard. Electronically Signed   By: Mitzi Hansen M.D.   On: 12/04/2018 17:19     Medical Consultants:    None.  Anti-Infectives:   IV cefepime  Subjective:    Stephanie Snyder  tolerating her diet no complaints.  Objective:    Vitals:   12/05/18 2114 12/05/18 2327 12/06/18 0403 12/06/18 0437  BP: 136/88 (!) 146/91 121/75   Pulse: 96 87 83   Resp:      Temp: 99.5 F (37.5 C) 99.3 F (37.4 C) 98.8 F (37.1 C)   TempSrc: Oral Oral Oral   SpO2: 99% 98% 96%   Weight:    66.3 kg  Height:       No intake or output data in the 24 hours ending 12/06/18 1000 Filed Weights   12/04/18 0500 12/05/18 0609 12/06/18 0437  Weight: 65.9 kg 66 kg  66.3 kg    Exam: General exam: In no acute distress. Respiratory system: Good air movement and clear to auscultation. Cardiovascular system: S1 & S2 heard, RRR. No JVD. Gastrointestinal system: Abdomen is nondistended, soft and nontender.  Central nervous system: Alert and oriented. No focal neurological deficits. Extremities: No pedal edema. Skin: No rashes, lesions or ulcers    Data Reviewed:    Labs: Basic Metabolic Panel: Recent Labs  Lab 11/29/18 1416 11/29/18 1905 11/30/18 0313  12/01/18 0856 12/02/18 0612 12/03/18 0512 12/04/18 0531 12/05/18 0508  NA 141 140 136   < > 138 142 147* 143 140  K 3.7 4.3 4.8   < > 4.4 3.0* 3.4* 3.5 3.4*  CL 102 105 108   < > 101 105 110 107 107  CO2 23 19* 15*   < > 29 27 28 26 25   GLUCOSE 136* 238* 289*   < > 182* 91 95 85 95  BUN 63* 57* 51*   < > 34* 15 8 5* 8  CREATININE 7.91* 6.77* 4.44*   < > 1.15* 0.73 0.70 0.67 0.81  CALCIUM 8.6* 7.8* 7.3*   < > 5.9* 7.8* 8.2* 8.5* 8.3*  MG 2.4 2.0 1.8  --  1.3* 2.2  --   --   --   PHOS  --   --  4.8*  --   --  1.7*  --   --   --    < > = values in this interval not displayed.   GFR Estimated Creatinine Clearance: 62.7 mL/min (by C-G formula based on SCr of 0.81 mg/dL). Liver Function Tests: Recent Labs  Lab 11/29/18 1416  AST 25  ALT 14  ALKPHOS 51  BILITOT 1.1  PROT 6.9  ALBUMIN 3.5   Recent Labs  Lab 11/29/18 1905  LIPASE 35   Recent Labs  Lab 11/29/18 1604  AMMONIA 16   Coagulation profile Recent  Labs  Lab 11/29/18 1905  INR 1.35    CBC: Recent Labs  Lab 11/29/18 1133  12/01/18 0856 12/02/18 0309 12/03/18 0512 12/04/18 0531 12/05/18 0508  WBC 8.3   < > 12.5* 9.8 8.8 11.9* 14.1*  NEUTROABS 5.8  --   --   --   --   --   --   HGB 12.8   < > 10.7* 11.5* 12.1 13.8 12.2  HCT 40.4   < > 33.0* 36.8 39.2 44.9 38.6  MCV 84.5   < > 84.8 85.2 84.5 85.5 85.6  PLT 175   < > 114* 139* 209 223 213   < > = values in this interval not displayed.   Cardiac Enzymes: Recent Labs  Lab 11/30/18 0313  CKTOTAL 109  CKMB 10.3*   BNP (last 3 results) No results for input(s): PROBNP in the last 8760 hours. CBG: Recent Labs  Lab 12/02/18 0812 12/02/18 1014 12/02/18 1054 12/02/18 1226 12/02/18 1704  GLUCAP 99 66* 87 112* 106*   D-Dimer: No results for input(s): DDIMER in the last 72 hours. Hgb A1c: Recent Labs    12/06/18 0441  HGBA1C 6.2*   Lipid Profile: Recent Labs    12/06/18 0441  CHOL 180  HDL 42  LDLCALC 122*  TRIG 81  CHOLHDL 4.3   Thyroid function studies: No results for input(s): TSH, T4TOTAL, T3FREE, THYROIDAB in the last 72 hours.  Invalid input(s): FREET3 Anemia work up: No results for input(s): VITAMINB12, FOLATE, FERRITIN, TIBC, IRON, RETICCTPCT in the last 72 hours. Sepsis Labs: Recent  Labs  Lab 11/29/18 1600 11/29/18 1905  11/30/18 1236 12/01/18 0856 12/02/18 0309 12/03/18 0512 12/04/18 0531 12/05/18 0508  WBC  --   --    < >  --  12.5* 9.8 8.8 11.9* 14.1*  LATICACIDVEN 2.9* 4.3*  --  2.3* 1.5  --   --   --   --    < > = values in this interval not displayed.   Microbiology Recent Results (from the past 240 hour(s))  Gastrointestinal Panel by PCR , Stool     Status: None   Collection Time: 11/29/18  2:16 PM  Result Value Ref Range Status   Campylobacter species NOT DETECTED NOT DETECTED Final   Plesimonas shigelloides NOT DETECTED NOT DETECTED Final   Salmonella species NOT DETECTED NOT DETECTED Final   Yersinia enterocolitica NOT  DETECTED NOT DETECTED Final   Vibrio species NOT DETECTED NOT DETECTED Final   Vibrio cholerae NOT DETECTED NOT DETECTED Final   Enteroaggregative E coli (EAEC) NOT DETECTED NOT DETECTED Final   Enteropathogenic E coli (EPEC) NOT DETECTED NOT DETECTED Final   Enterotoxigenic E coli (ETEC) NOT DETECTED NOT DETECTED Final   Shiga like toxin producing E coli (STEC) NOT DETECTED NOT DETECTED Final   Shigella/Enteroinvasive E coli (EIEC) NOT DETECTED NOT DETECTED Final   Cryptosporidium NOT DETECTED NOT DETECTED Final   Cyclospora cayetanensis NOT DETECTED NOT DETECTED Final   Entamoeba histolytica NOT DETECTED NOT DETECTED Final   Giardia lamblia NOT DETECTED NOT DETECTED Final   Adenovirus F40/41 NOT DETECTED NOT DETECTED Final   Astrovirus NOT DETECTED NOT DETECTED Final   Norovirus GI/GII NOT DETECTED NOT DETECTED Final   Rotavirus A NOT DETECTED NOT DETECTED Final   Sapovirus (I, II, IV, and V) NOT DETECTED NOT DETECTED Final    Comment: Performed at [facility], [address] [place], [place] [address]  Culture, blood (routine x 2)     Status: None   Collection Time: 11/29/18  2:17 PM  Result Value Ref Range Status   Specimen Description BLOOD RIGHT ANTECUBITAL  Final   Special Requests   Final    BOTTLES DRAWN AEROBIC ONLY Blood Culture adequate volume Performed at [facility], [address] [place], [place] [address]    Culture NO GROWTH 5 DAYS  Final   Report Status 12/04/2018 FINAL  Final  Culture, Urine     Status: None   Collection Time: 11/29/18  3:44 PM  Result Value Ref Range Status   Specimen Description   Final    URINE, CATHETERIZED Performed at [facility], [address] [place], [place] [address]    Special Requests   Final    NONE Performed at [facility], [address] [place], [place] [address]    Culture   Final    NO GROWTH Performed at [facility], [address]  [place], [place] [address]    Report Status 12/01/2018 FINAL  Final  Culture, blood (routine x 2)     Status: Abnormal   Collection Time: 11/29/18  4:00 PM  Result Value Ref Range Status   Specimen Description   Final    BLOOD CENTRAL LINE Performed at [facility], [address] [place], [place] [address]    Special Requests   Final    BOTTLES DRAWN AEROBIC AND ANAEROBIC Blood Culture adequate volume Performed at [facility], [address] [place], [place] [address]  Culture  Setup Time   Final    GRAM POSITIVE COCCI IN BOTH AEROBIC AND ANAEROBIC BOTTLES CRITICAL RESULT CALLED TO, READ BACK BY AND VERIFIED WITH: M. LILLISTON, PHARMD ([facility]) AT 1515 ON 11/30/18 BY C. JESSUP, MLT.    Culture (A)  Final    STAPHYLOCOCCUS SPECIES (COAGULASE NEGATIVE) THE SIGNIFICANCE OF ISOLATING THIS ORGANISM FROM A SINGLE SET OF BLOOD CULTURES WHEN MULTIPLE SETS ARE DRAWN IS UNCERTAIN. PLEASE NOTIFY THE MICROBIOLOGY DEPARTMENT WITHIN ONE WEEK IF SPECIATION AND SENSITIVITIES ARE REQUIRED. Performed at [facility], [address] [place], [place] [address]    Report Status 12/04/2018 FINAL  Final  Blood Culture ID Panel (Reflexed)     Status: Abnormal   Collection Time: 11/29/18  4:00 PM  Result Value Ref Range Status   Enterococcus species NOT DETECTED NOT DETECTED Final   Listeria monocytogenes NOT DETECTED NOT DETECTED Final   Staphylococcus species DETECTED (A) NOT DETECTED Final    Comment: Methicillin (oxacillin) resistant coagulase negative staphylococcus. Possible blood culture contaminant (unless isolated from more than one blood culture draw or clinical case suggests pathogenicity). No antibiotic treatment is indicated for blood  culture contaminants. CRITICAL RESULT CALLED TO, READ BACK BY AND VERIFIED WITH: Damaris HippoM. LILLISTON, PHARMD AT 1515 ON 11/30/18 BY C. JESSUP, MLT.    Staphylococcus aureus (BCID) NOT DETECTED NOT DETECTED Final   Methicillin  resistance DETECTED (A) NOT DETECTED Final    Comment: CRITICAL RESULT CALLED TO, READ BACK BY AND VERIFIED WITH: M. LILLISTON, PHARMD ([facility]) AT 1515 ON 11/30/18 BY C. JESSUP, MLT.    Streptococcus species NOT DETECTED NOT DETECTED Final   Streptococcus agalactiae NOT DETECTED NOT DETECTED Final   Streptococcus pneumoniae NOT DETECTED NOT DETECTED Final   Streptococcus pyogenes NOT DETECTED NOT DETECTED Final   Acinetobacter baumannii NOT DETECTED NOT DETECTED Final   Enterobacteriaceae species NOT DETECTED NOT DETECTED Final   Enterobacter cloacae complex NOT DETECTED NOT DETECTED Final   Escherichia coli NOT DETECTED NOT DETECTED Final   Klebsiella oxytoca NOT DETECTED NOT DETECTED Final   Klebsiella pneumoniae NOT DETECTED NOT DETECTED Final   Proteus species NOT DETECTED NOT DETECTED Final   Serratia marcescens NOT DETECTED NOT DETECTED Final   Haemophilus influenzae NOT DETECTED NOT DETECTED Final   Neisseria meningitidis NOT DETECTED NOT DETECTED Final   Pseudomonas aeruginosa NOT DETECTED NOT DETECTED Final   Candida albicans NOT DETECTED NOT DETECTED Final   Candida glabrata NOT DETECTED NOT DETECTED Final   Candida krusei NOT DETECTED NOT DETECTED Final   Candida parapsilosis NOT DETECTED NOT DETECTED Final   Candida tropicalis NOT DETECTED NOT DETECTED Final    Comment: Performed at [facility], [address] [place], [place] [address]  MRSA PCR Screening     Status: None   Collection Time: 11/29/18  6:45 PM  Result Value Ref Range Status   MRSA by PCR NEGATIVE NEGATIVE Final    Comment:        The GeneXpert MRSA Assay (FDA approved for NASAL specimens only), is one component of a comprehensive MRSA colonization surveillance program. It is not intended to diagnose MRSA infection nor to guide or monitor treatment for MRSA infections. Performed at [facility], [address] [place], [place] [address]   Culture, blood (routine x 2)      Status: None   Collection Time: 12/01/18 10:52 AM  Result Value Ref Range Status   Specimen Description   Final    BLOOD  LEFT ARM Performed at [facility], [address], [place], [place] [address]    Special Requests   Final    BOTTLES DRAWN AEROBIC ONLY Blood Culture adequate volume Performed at [facility], [address], [place], [place] [address]    Culture   Final    NO GROWTH 5 DAYS Performed at [facility], [address] [place], [place] [address]    Report Status 12/06/2018 FINAL  Final  Culture, blood (routine x 2)     Status: None   Collection Time: 12/01/18 10:52 AM  Result Value Ref Range Status   Specimen Description   Final    BLOOD LEFT HAND Performed at [facility], [address], [place], [place] [address]    Special Requests   Final    BOTTLES DRAWN AEROBIC ONLY Blood Culture adequate volume Performed at [facility], [address] [place], [place] [address]    Culture   Final    NO GROWTH 5 DAYS Performed at [facility], [address], [place], [place] [address]    Report Status 12/06/2018 FINAL  Final     Medications:   . amLODipine  10 mg Oral Daily  . atorvastatin  20 mg Oral q1800  . lisinopril  20 mg Oral Daily   And  . hydrochlorothiazide  25 mg Oral Daily  . Influenza vac split quadrivalent PF  0.5 mL Intramuscular Tomorrow-1000  . pneumococcal 23 valent vaccine  0.5 mL Intramuscular Tomorrow-1000  . Rivaroxaban  15 mg Oral BID WC   Continuous Infusions: . sodium chloride 250 mL (12/04/18 0834)      LOS: 7 days   Marinda Elk  [facility]   *Please refer to [email or web address], password [email or web address] to get updated schedule on who will round on this patient, as hospitalists switch teams weekly. If 7PM-7AM, please contact night-coverage at [email or web address], password [email or web address] for any overnight needs.  12/06/2018, 10:00 AM

## 2018-12-07 DIAGNOSIS — J9601 Acute respiratory failure with hypoxia: Secondary | ICD-10-CM

## 2018-12-07 DIAGNOSIS — J9600 Acute respiratory failure, unspecified whether with hypoxia or hypercapnia: Secondary | ICD-10-CM

## 2018-12-07 DIAGNOSIS — I63311 Cerebral infarction due to thrombosis of right middle cerebral artery: Secondary | ICD-10-CM

## 2018-12-07 MED ORDER — RIVAROXABAN 20 MG PO TABS: 20.0000 mg | ORAL_TABLET | Freq: Every day | ORAL | Status: DC

## 2018-12-07 MED ORDER — RIVAROXABAN 15 MG PO TABS
15.0000 mg | ORAL_TABLET | Freq: Two times a day (BID) | ORAL | Status: DC
  Administered 2018-12-07 – 2018-12-08 (×2): 15 mg via ORAL
  Filled 2018-12-07 (×2): qty 1

## 2018-12-07 NOTE — Care Management Note (Signed)
Case Management Note  Patient Details  Name: Stephanie Snyder MRN: [id number] Date of Birth: 1956/10/29  Subjective/Objective:                    Action/Plan:Pt and her fiance selected to discharge home with [facility].   Expected Discharge Date:  (unknown)               Expected Discharge Plan:  Home/Self Care  In-House Referral:     Discharge planning Services  CM Consult  Post Acute Care Choice:    Choice offered to:  Patient, Spouse  DME Arranged:    DME Agency:     HH Arranged:  RN, PT, Nurse's Aide HH Agency:  [facility]  Status of Service:  In process, will continue to follow  If discussed at Long Length of Stay Meetings, dates discussed:    Additional CommentsGeni Bers, RN 12/07/2018, 4:10 PM

## 2018-12-07 NOTE — Consult Note (Signed)
   [facility] Inpatient Consult   12/07/2018  Marvene Lashway 06-27-1956 [id number]   Telephone call from [facility] with Frances Furbish requesting that writer come to bedside to speak with Ms. Dansereau about potential [facility] Care Management assistance, for transportation to MD appointments, while on the [facility] First program.   Micah Flesher to bedside to speak with Ms. Mew and her fiance Barbara Cower. Cory with [facility] was also present. Discussed [facility] Care Management's involvement for potential transportation needs. Ms. Barletta endorses that transportation is an issue. She states she has applied for [employer]. However, she and her fiance states the application has not been processed as of yet.  [facility] Care Management written consent was signed and [facility] folder was provided.  Will notify [facility] Care Management office that Ms. Baumeister will enroll in the [facility] First program.  Made inpatient RNCM aware of bedside encounter with patient and fiance.  Raiford Noble, MSN-Ed, RN,BSN [facility] Liaison [phone]

## 2018-12-07 NOTE — Progress Notes (Signed)
PROGRESS NOTE  Stephanie Snyder GMW:[id number] DOB: 1955-12-26 DOA: 11/21/2018 PCP: Claudean Severance, MD  HPI/Recap of past 24 hours:  She is very lethargic this am tmax 99.5 Poor oral intake  Assessment/Plan: Principal Problem:   Acute psychosis (HCC) Active Problems:   Hypovolemic shock (HCC)   Toxic encephalopathy   DVT, lower extremity, distal, acute, bilateral (HCC)   AKI (acute kidney injury) (HCC)   Hypovolemic shock (HCC): - She was fluid resuscitated, she is now off pressors. - Blood cultures 1/2 coag negative staph BCID coag negative staph likely a contaminant. -She has remained afebrile for over 72 hrs, continue to hold antibiotics.  Acute PE with acute bilateral DVT: - CT Angio of the chest: Submassive PE, with right heart strain. - 2D echo showed normal systolic function right ventricle was moderately dilated and systolic function moderately reduced with right atrium dilation. - Vitals are stable, she needs a mamogram, pap smear and colonoscopy as an outpatient. -She is on Xarelto and tolerating it.  Acute confusional state/acute psychosis (HCC)/toxic metabolic encephalopathy: Likely multifactorial in the setting of polypharmacy, CVA and shock state. EEG completed on 11/29/2018 that was normal. Psych was consulted and she had no paranoid thoughts when she is more confused they are concerned about dementia. She will have to follow-up with her primary care doctor as an outpatient.  Acute kidney injury: Likely prerenal azotemia resolved with fluid resuscitation and pressors.  Anion gap metabolic acidosis: Likely due to hypovolemia now resolved.  New CVA of the globus pallidus: HgbA1c 6.2, fasting lipid panel  MRI, MRA 5 mm globus pallidus CVA. Patient will therapy evaluated the patient recommended skilled nursing facility versus 24-hour care at home. Physical therapy evaluation is pending. CT scan of the head and neck with contrast atheromatous  irregularities throughout the carotid siphon on the right ICA, there was an atheromatous plaque on the carotid bifurcation with 30% stenosis and additional multifocal irregularities throughout the intracranial circulation. Echocardiogram with bubble studies show no left-to-right shunt She is on Xarelto. Patient neurology assistance awaiting further recommendations.  Leukocytosis: She had 1 out of 2 blood cultures positive for methicillin-resistant staph coag negative. Remain off antibiotics and has remained afebrile. Repeated blood cultures have remained negative till date. Repeat cbc in am   DVT prophylaxis: Xarelto.   Code Status: full  Family Communication: patient   Disposition Plan: family declined snf, wants go home with home health Pending improvement of leukocytosis and resolution of lethargy  Consultants:  neurology  Procedures:  none  Antibiotics:  Cefepime/flagyl   Objective: BP 140/66 (BP Location: Right Arm)   Pulse 91   Temp 99.2 F (37.3 C) (Oral)   Resp 18   Ht 5\' 2"  (1.575 m)   Wt 66.3 kg   SpO2 99%   BMI 26.73 kg/m  No intake or output data in the 24 hours ending 12/07/18 1710 Filed Weights   12/04/18 0500 12/05/18 0609 12/06/18 0437  Weight: 65.9 kg 66 kg 66.3 kg    Exam: Patient is examined daily including today on 12/07/2018, exams remain the same as of yesterday except that has changed    General:  Very lethargic, drift back to sleep during conversation, per RN patient was alert and interactive this am  Cardiovascular: RRR  Respiratory: CTABL  Abdomen: Soft/ND/NT, positive BS  Musculoskeletal: No Edema  Neuro: lethargic  Data Reviewed: Basic Metabolic Panel: Recent Labs  Lab 12/01/18 0856 12/02/18 0612 12/03/18 0512 12/04/18 0531 12/05/18 0508  NA 138 142 147*  143 140  K 4.4 3.0* 3.4* 3.5 3.4*  CL 101 105 110 107 107  CO2 29 27 28 26 25   GLUCOSE 182* 91 95 85 95  BUN 34* 15 8 5* 8  CREATININE 1.15* 0.73 0.70  0.67 0.81  CALCIUM 5.9* 7.8* 8.2* 8.5* 8.3*  MG 1.3* 2.2  --   --   --   PHOS  --  1.7*  --   --   --    Liver Function Tests: No results for input(s): AST, ALT, ALKPHOS, BILITOT, PROT, ALBUMIN in the last 168 hours. No results for input(s): LIPASE, AMYLASE in the last 168 hours. No results for input(s): AMMONIA in the last 168 hours. CBC: Recent Labs  Lab 12/01/18 0856 12/02/18 0309 12/03/18 0512 12/04/18 0531 12/05/18 0508  WBC 12.5* 9.8 8.8 11.9* 14.1*  HGB 10.7* 11.5* 12.1 13.8 12.2  HCT 33.0* 36.8 39.2 44.9 38.6  MCV 84.8 85.2 84.5 85.5 85.6  PLT 114* 139* 209 223 213   Cardiac Enzymes:   No results for input(s): CKTOTAL, CKMB, CKMBINDEX, TROPONINI in the last 168 hours. BNP (last 3 results) Recent Labs    11/30/18 0313  BNP 177.9*    ProBNP (last 3 results) No results for input(s): PROBNP in the last 8760 hours.  CBG: Recent Labs  Lab 12/02/18 0812 12/02/18 1014 12/02/18 1054 12/02/18 1226 12/02/18 1704  GLUCAP 99 66* 87 112* 106*    Recent Results (from the past 240 hour(s))  Gastrointestinal Panel by PCR , Stool     Status: None   Collection Time: 11/29/18  2:16 PM  Result Value Ref Range Status   Campylobacter species NOT DETECTED NOT DETECTED Final   Plesimonas shigelloides NOT DETECTED NOT DETECTED Final   Salmonella species NOT DETECTED NOT DETECTED Final   Yersinia enterocolitica NOT DETECTED NOT DETECTED Final   Vibrio species NOT DETECTED NOT DETECTED Final   Vibrio cholerae NOT DETECTED NOT DETECTED Final   Enteroaggregative E coli (EAEC) NOT DETECTED NOT DETECTED Final   Enteropathogenic E coli (EPEC) NOT DETECTED NOT DETECTED Final   Enterotoxigenic E coli (ETEC) NOT DETECTED NOT DETECTED Final   Shiga like toxin producing E coli (STEC) NOT DETECTED NOT DETECTED Final   Shigella/Enteroinvasive E coli (EIEC) NOT DETECTED NOT DETECTED Final   Cryptosporidium NOT DETECTED NOT DETECTED Final   Cyclospora cayetanensis NOT DETECTED NOT  DETECTED Final   Entamoeba histolytica NOT DETECTED NOT DETECTED Final   Giardia lamblia NOT DETECTED NOT DETECTED Final   Adenovirus F40/41 NOT DETECTED NOT DETECTED Final   Astrovirus NOT DETECTED NOT DETECTED Final   Norovirus GI/GII NOT DETECTED NOT DETECTED Final   Rotavirus A NOT DETECTED NOT DETECTED Final   Sapovirus (I, II, IV, and V) NOT DETECTED NOT DETECTED Final    Comment: Performed at [facility], [address] [place], [place] [address]  Culture, blood (routine x 2)     Status: None   Collection Time: 11/29/18  2:17 PM  Result Value Ref Range Status   Specimen Description BLOOD RIGHT ANTECUBITAL  Final   Special Requests   Final    BOTTLES DRAWN AEROBIC ONLY Blood Culture adequate volume Performed at [facility], [address] [place], [place] [address]    Culture NO GROWTH 5 DAYS  Final   Report Status 12/04/2018 FINAL  Final  Culture, Urine     Status: None   Collection Time: 11/29/18  3:44 PM  Result Value Ref Range Status  Specimen Description   Final    URINE, CATHETERIZED Performed at [facility], [address] [place], [place] [address]    Special Requests   Final    NONE Performed at [facility], [address] [place], [place] [address]    Culture   Final    NO GROWTH Performed at [facility], [address] [place], [place] [address]    Report Status 12/01/2018 FINAL  Final  Culture, blood (routine x 2)     Status: Abnormal   Collection Time: 11/29/18  4:00 PM  Result Value Ref Range Status   Specimen Description   Final    BLOOD CENTRAL LINE Performed at [facility], [address] [place], [place] [address]    Special Requests   Final    BOTTLES DRAWN AEROBIC AND ANAEROBIC Blood Culture adequate volume Performed at [facility], [address] [place], [place] [address]    Culture  Setup Time   Final    GRAM POSITIVE  COCCI IN BOTH AEROBIC AND ANAEROBIC BOTTLES CRITICAL RESULT CALLED TO, READ BACK BY AND VERIFIED WITH: Damaris HippoM. LILLISTON, PHARMD ([facility]) AT 1515 ON 11/30/18 BY C. JESSUP, MLT.    Culture (A)  Final    STAPHYLOCOCCUS SPECIES (COAGULASE NEGATIVE) THE SIGNIFICANCE OF ISOLATING THIS ORGANISM FROM A SINGLE SET OF BLOOD CULTURES WHEN MULTIPLE SETS ARE DRAWN IS UNCERTAIN. PLEASE NOTIFY THE MICROBIOLOGY DEPARTMENT WITHIN ONE WEEK IF SPECIATION AND SENSITIVITIES ARE REQUIRED. Performed at [facility], [address] [place], [place] [address]    Report Status 12/04/2018 FINAL  Final  Blood Culture ID Panel (Reflexed)     Status: Abnormal   Collection Time: 11/29/18  4:00 PM  Result Value Ref Range Status   Enterococcus species NOT DETECTED NOT DETECTED Final   Listeria monocytogenes NOT DETECTED NOT DETECTED Final   Staphylococcus species DETECTED (A) NOT DETECTED Final    Comment: Methicillin (oxacillin) resistant coagulase negative staphylococcus. Possible blood culture contaminant (unless isolated from more than one blood culture draw or clinical case suggests pathogenicity). No antibiotic treatment is indicated for blood  culture contaminants. CRITICAL RESULT CALLED TO, READ BACK BY AND VERIFIED WITH: Damaris HippoM. LILLISTON, PHARMD AT 1515 ON 11/30/18 BY C. JESSUP, MLT.    Staphylococcus aureus (BCID) NOT DETECTED NOT DETECTED Final   Methicillin resistance DETECTED (A) NOT DETECTED Final    Comment: CRITICAL RESULT CALLED TO, READ BACK BY AND VERIFIED WITH: M. LILLISTON, PHARMD ([facility]) AT 1515 ON 11/30/18 BY C. JESSUP, MLT.    Streptococcus species NOT DETECTED NOT DETECTED Final   Streptococcus agalactiae NOT DETECTED NOT DETECTED Final   Streptococcus pneumoniae NOT DETECTED NOT DETECTED Final   Streptococcus pyogenes NOT DETECTED NOT DETECTED Final   Acinetobacter baumannii NOT DETECTED NOT DETECTED Final   Enterobacteriaceae species NOT DETECTED NOT DETECTED Final   Enterobacter cloacae complex NOT  DETECTED NOT DETECTED Final   Escherichia coli NOT DETECTED NOT DETECTED Final   Klebsiella oxytoca NOT DETECTED NOT DETECTED Final   Klebsiella pneumoniae NOT DETECTED NOT DETECTED Final   Proteus species NOT DETECTED NOT DETECTED Final   Serratia marcescens NOT DETECTED NOT DETECTED Final   Haemophilus influenzae NOT DETECTED NOT DETECTED Final   Neisseria meningitidis NOT DETECTED NOT DETECTED Final   Pseudomonas aeruginosa NOT DETECTED NOT DETECTED Final   Candida albicans NOT DETECTED NOT DETECTED Final   Candida glabrata NOT DETECTED NOT DETECTED Final   Candida krusei  NOT DETECTED NOT DETECTED Final   Candida parapsilosis NOT DETECTED NOT DETECTED Final   Candida tropicalis NOT DETECTED NOT DETECTED Final    Comment: Performed at [facility], [address], [place], [place] [address]  MRSA PCR Screening     Status: None   Collection Time: 11/29/18  6:45 PM  Result Value Ref Range Status   MRSA by PCR NEGATIVE NEGATIVE Final    Comment:        The GeneXpert MRSA Assay (FDA approved for NASAL specimens only), is one component of a comprehensive MRSA colonization surveillance program. It is not intended to diagnose MRSA infection nor to guide or monitor treatment for MRSA infections. Performed at [facility], [address], [place], [place] [address]   Culture, blood (routine x 2)     Status: None   Collection Time: 12/01/18 10:52 AM  Result Value Ref Range Status   Specimen Description   Final    BLOOD LEFT ARM Performed at [facility], [address], [place], [place] [address]    Special Requests   Final    BOTTLES DRAWN AEROBIC ONLY Blood Culture adequate volume Performed at [facility], [address] [place], [place] [address]    Culture   Final    NO GROWTH 5 DAYS Performed at [facility], [address] [place], [place] [address]    Report Status 12/06/2018 FINAL  Final  Culture,  blood (routine x 2)     Status: None   Collection Time: 12/01/18 10:52 AM  Result Value Ref Range Status   Specimen Description   Final    BLOOD LEFT HAND Performed at [facility], [address], [place], [place] [address]    Special Requests   Final    BOTTLES DRAWN AEROBIC ONLY Blood Culture adequate volume Performed at [facility], [address] [place], [place] [address]    Culture   Final    NO GROWTH 5 DAYS Performed at [facility], [address] [place], [place] [address]    Report Status 12/06/2018 FINAL  Final     Studies: No results found.  Scheduled Meds: . amLODipine  10 mg Oral Daily  . atorvastatin  20 mg Oral q1800  . lisinopril  20 mg Oral Daily   And  . hydrochlorothiazide  25 mg Oral Daily  . rivaroxaban  15 mg Oral BID WC   Followed by  . [START ON 12/25/2018] rivaroxaban  20 mg Oral Q supper    Continuous Infusions: . sodium chloride 250 mL (12/04/18 0834)     Time spent: I have personally reviewed and interpreted on  12/07/2018 daily labs,  imagings as discussed above under date review session and assessment and plans.  I reviewed all nursing notes, pharmacy notes, consultant notes,  vitals, pertinent old records  I have discussed plan of care as described above with RN , patient on 12/07/2018   Albertine Grates MD, PhD  [facility] Pager [phone]. If 7PM-7AM, please contact night-coverage at [email or web address], password [email or web address] 12/07/2018, 5:10 PM  LOS: 8 days

## 2018-12-08 DIAGNOSIS — N179 Acute kidney failure, unspecified: Secondary | ICD-10-CM

## 2018-12-08 DIAGNOSIS — E876 Hypokalemia: Secondary | ICD-10-CM

## 2018-12-08 DIAGNOSIS — G9341 Metabolic encephalopathy: Secondary | ICD-10-CM

## 2018-12-08 LAB — MAGNESIUM: Magnesium: 2 mg/dL (ref 1.7–2.4)

## 2018-12-08 LAB — CBC WITH DIFFERENTIAL/PLATELET
Abs Immature Granulocytes: 0.14 10*3/uL — ABNORMAL HIGH (ref 0.00–0.07)
Basophils Absolute: 0.1 10*3/uL (ref 0.0–0.1)
Basophils Relative: 1 %
Eosinophils Absolute: 0.3 10*3/uL (ref 0.0–0.5)
Eosinophils Relative: 3 %
HCT: 36.5 % (ref 36.0–46.0)
Hemoglobin: 11.2 g/dL — ABNORMAL LOW (ref 12.0–15.0)
Immature Granulocytes: 2 %
Lymphocytes Relative: 31 %
Lymphs Abs: 2.7 10*3/uL (ref 0.7–4.0)
MCH: 26.8 pg (ref 26.0–34.0)
MCHC: 30.7 g/dL (ref 30.0–36.0)
MCV: 87.3 fL (ref 80.0–100.0)
Monocytes Absolute: 1.1 10*3/uL — ABNORMAL HIGH (ref 0.1–1.0)
Monocytes Relative: 13 %
Neutro Abs: 4.3 10*3/uL (ref 1.7–7.7)
Neutrophils Relative %: 50 %
Platelets: 355 10*3/uL (ref 150–400)
RBC: 4.18 MIL/uL (ref 3.87–5.11)
RDW: 13.7 % (ref 11.5–15.5)
WBC: 8.5 10*3/uL (ref 4.0–10.5)
nRBC: 0 % (ref 0.0–0.2)

## 2018-12-08 LAB — BASIC METABOLIC PANEL
Anion gap: 8 (ref 5–15)
BUN: 10 mg/dL (ref 8–23)
CO2: 24 mmol/L (ref 22–32)
Calcium: 8.1 mg/dL — ABNORMAL LOW (ref 8.9–10.3)
Chloride: 106 mmol/L (ref 98–111)
Creatinine, Ser: 0.71 mg/dL (ref 0.44–1.00)
GFR calc Af Amer: >60 mL/min (ref >60)
GFR calc non Af Amer: >60 mL/min (ref >60)
GLUCOSE: 91 mg/dL (ref 70–99)
Potassium: 3.4 mmol/L — ABNORMAL LOW (ref 3.5–5.1)
Sodium: 138 mmol/L (ref 135–145)

## 2018-12-08 MED ORDER — POLYETHYLENE GLYCOL 3350 17 G PO PACK: 17.0000 g | PACK | Freq: Every day | ORAL | 0 refills | Status: DC

## 2018-12-08 MED ORDER — SENNOSIDES-DOCUSATE SODIUM 8.6-50 MG PO TABS
1.0000 | ORAL_TABLET | Freq: Two times a day (BID) | ORAL | Status: DC
  Administered 2018-12-08: 1 via ORAL
  Filled 2018-12-08: qty 1

## 2018-12-08 MED ORDER — RIVAROXABAN (XARELTO) VTE STARTER PACK (15 & 20 MG): ORAL_TABLET | ORAL | 0 refills | Status: DC

## 2018-12-08 MED ORDER — ATORVASTATIN CALCIUM 20 MG PO TABS: 20.0000 mg | ORAL_TABLET | Freq: Every day | ORAL | 0 refills | Status: DC

## 2018-12-08 MED ORDER — SENNOSIDES-DOCUSATE SODIUM 8.6-50 MG PO TABS: 1.0000 | ORAL_TABLET | Freq: Every day | ORAL | 0 refills | Status: DC

## 2018-12-08 MED ORDER — POTASSIUM CHLORIDE CRYS ER 20 MEQ PO TBCR
40.0000 meq | EXTENDED_RELEASE_TABLET | Freq: Once | ORAL | Status: AC
  Administered 2018-12-08: 40 meq via ORAL
  Filled 2018-12-08: qty 2

## 2018-12-08 MED ORDER — POLYETHYLENE GLYCOL 3350 17 G PO PACK
17.0000 g | PACK | Freq: Every day | ORAL | Status: DC
  Administered 2018-12-08: 17 g via ORAL
  Filled 2018-12-08: qty 1

## 2018-12-08 NOTE — Progress Notes (Signed)
Physical Therapy Treatment Patient Details Name: Stephanie Snyder MRN: [id number] DOB: 08/25/1956 Today's Date: 12/08/2018    History of Present Illness Pt is a 63 year old female was admitted for syncope/hypotension/confusion.  Found to have submassive PE, bil DVTs and small globus palliadus stroke.      PT Comments    Pt ambulated in hallway with RW and reports d/c home today.  Recommend 24/7 assist initially as pt requiring UE support and high fall risk.  Follow Up Recommendations  Supervision/Assistance - 24 hour;Home health PT     Equipment Recommendations  Rolling walker with 5" wheels    Recommendations for Other Services       Precautions / Restrictions Precautions Precautions: Fall    Mobility  Bed Mobility Overal bed mobility: Modified Independent                Transfers Overall transfer level: Needs assistance Equipment used: Rolling walker (2 wheeled) Transfers: Sit to/from Stand Sit to Stand: Min guard         General transfer comment: verbal cues for hand placement  Ambulation/Gait Ambulation/Gait assistance: Min guard Gait Distance (Feet): 240 Feet Assistive device: Rolling walker (2 wheeled) Gait Pattern/deviations: Step-through pattern;Trunk flexed;Decreased stride length     General Gait Details: verbal cues for RW positioning, posture   Stairs             Wheelchair Mobility    Modified Rankin (Stroke Patients Only)       Balance Overall balance assessment: Needs assistance         Standing balance support: Bilateral upper extremity supported Standing balance-Leahy Scale: Poor Standing balance comment: static standing fair without UE support, dynamic is poor and requires at least UE assist                            Cognition Arousal/Alertness: Awake/alert Behavior During Therapy: WFL for tasks assessed/performed Overall Cognitive Status: No family/caregiver present to determine baseline cognitive  functioning                                        Exercises      General Comments        Pertinent Vitals/Pain Pain Assessment: Faces Faces Pain Scale: Hurts a little bit Pain Location: R heel pain with weight bearing Pain Descriptors / Indicators: Discomfort Pain Intervention(s): Monitored during session;Limited activity within patient's tolerance;Repositioned    Home Living                      Prior Function            PT Goals (current goals can now be found in the care plan section) Progress towards PT goals: Progressing toward goals    Frequency    Min 3X/week      PT Plan Current plan remains appropriate    Co-evaluation              AM-PAC PT "6 Clicks" Mobility   Outcome Measure  Help needed turning from your back to your side while in a flat bed without using bedrails?: None Help needed moving from lying on your back to sitting on the side of a flat bed without using bedrails?: None Help needed moving to and from a bed to a chair (including a wheelchair)?: None Help needed standing up from  a chair using your arms (e.g., wheelchair or bedside chair)?: A Little Help needed to walk in hospital room?: A Little Help needed climbing 3-5 steps with a railing? : A Little 6 Click Score: 21    End of Session Equipment Utilized During Treatment: Gait belt Activity Tolerance: Patient tolerated treatment well Patient left: in chair;with chair alarm set;with call bell/phone within reach Nurse Communication: Mobility status PT Visit Diagnosis: Other abnormalities of gait and mobility (R26.89)     Time: 1224-8250 PT Time Calculation (min) (ACUTE ONLY): 19 min  Charges:  $Gait Training: 8-22 mins                    Zenovia Jarred, PT, DPT Acute Rehabilitation Services Office: [phone] Pager: [phone]  Sarajane Jews 12/08/2018, 4:31 PM

## 2018-12-08 NOTE — Care Management Important Message (Signed)
Important Message  Patient Details  Name: Stephanie Snyder MRN: [id number] Date of Birth: 04/17/1956   Medicare Important Message Given:  Yes    Caren Macadam 12/08/2018, 11:37 AMImportant Message  Patient Details  Name: Stephanie Snyder MRN: [id number] Date of Birth: 11-Aug-1956   Medicare Important Message Given:  Yes    Caren Macadam 12/08/2018, 11:37 AM

## 2018-12-08 NOTE — Discharge Summary (Signed)
Discharge Summary  Stephanie Snyder WUJ:[id number] DOB: March 10, 1956  PCP: Claudean Severance, MD  Admit date: 11/21/2018 Discharge date: 12/08/2018  Time spent: , more than 50% time spent on coordination of care.  Recommendations for Outpatient Follow-up:  1. F/u with PCP within a week  for hospital discharge follow up, repeat cbc/bmp at follow up. pcp to refer patient to have cancer screening test. 2. F/u with neurology 3. Patient and family declined SNF, they wants to go home with home health.  Discharge Diagnoses:  Active Hospital Problems   Diagnosis Date Noted  . Acute psychosis (HCC)   . Acute respiratory failure (HCC)   . Cerebrovascular accident (CVA) due to thrombosis of right middle cerebral artery (HCC)   . Toxic encephalopathy 12/02/2018  . DVT, lower extremity, distal, acute, bilateral (HCC) 12/02/2018  . AKI (acute kidney injury) (HCC) 12/02/2018  . Hypovolemic shock (HCC) 11/29/2018    Resolved Hospital Problems   Diagnosis Date Noted Date Resolved  . Polypharmacy 12/02/2018 12/03/2018    Discharge Condition: stable  Diet recommendation: heart healthy  Filed Weights   12/05/18 0609 12/06/18 0437 12/08/18 0634  Weight: 66 kg 66.3 kg 67.1 kg    History of present illness: (per critical care  PCCM admitting HPI) This is a 63 y.o., female admitted on 11/21/2018 admitted following syncopal episode in the psych ED following discharge. Patient had spent 9 days in psych holding. Following the syncopal episode she was found to be hypotensive. The patient was transferred to the ED. She was started on vasopressors. She was found to have significant renal failure.   There was concern for PE and the patient was placed heparin by the ED. PCCM was asked to admit.   Hospital Course:  Principal Problem:   Acute psychosis (HCC) Active Problems:   Hypovolemic shock (HCC)   Toxic encephalopathy   DVT, lower extremity, distal, acute, bilateral (HCC)   AKI (acute  kidney injury) (HCC)   Acute respiratory failure (HCC)   Cerebrovascular accident (CVA) due to thrombosis of right middle cerebral artery (HCC)   Hypovolemic shock (HCC): - She was fluid resuscitated, she is now off pressors. - Blood cultures 1/2 coag negative staph BCID coag negative staph likely a contaminant. -resolved , she is started back on her home bp meds including norvasc and lisinopril/hctz, bp stable at discharge.   Acute PE with acute bilateral DVT: - CT Angio of the chest: Submassive PE, with right heart strain. - 2D echo showed normal systolic function right ventricle was moderately dilated and systolic function moderately reduced with right atrium dilation. - Vitals are stable, she needs a mamogram, pap smear and colonoscopy as an outpatient. F/u with pcp  -She is started on Xarelto .  Acute confusional state/acute psychosis (HCC)/toxic metabolic encephalopathy: Likely multifactorial in the setting of polypharmacy, CVAand shock state. EEG completed on 11/29/2018 that was normal. Psych was consulted and she had no paranoid thoughts when she is more confused they are concerned about dementia. She will have to follow-up with her primary care doctor /neurology as an outpatient. She is aaox3, slight confusion about the year, but able to self correct. She is fully alert, pleasant and interactive at discharge.   Acute kidney injury: Likely prerenal azotemia resolved with fluid resuscitation and pressors.  Anion gap metabolic acidosis: Likely due to hypovolemia now resolved.  New CVA of the globus pallidus: HgbA1c6.2, fasting lipid panel  MRI, MRA 5 mm globus pallidus CVA. CT scan of the head and neck with  contrast atheromatous irregularities throughout the carotid siphon on the right ICA, there was an atheromatous plaque on the carotid bifurcation with 30% stenosis and additional multifocal irregularities throughout the intracranial circulation. Echocardiogram with  bubble studiesshow no left-to-right shunt She is started on and discharged on Xarelto/statin -neurology consulted , input appreciated, she is to follow up with neurology in 4 weeks.   Leukocytosis: She had 1 out of 2 blood cultures positive for methicillin-resistant staph coag negative. Remain off antibiotics and has remained afebrile. Repeated blood cultures have remained negative till date. Urine culture no growth, gi pcr panel negative, mrsa screening negative Leukocytosis resolved at discharge.   DVT prophylaxis:Xarelto.   Code Status: full  Family Communication: patient and husband at bedside  Disposition Plan: family declined snf, wants go home with home health   Consultants: Patient is admitted to critical care, then transferred to hospitalist service  Neurology  Psychiatry   Procedures:  none  Antibiotics:  Cefepime/flagyl  Discharge Exam: BP 130/76 (BP Location: Left Arm)   Pulse 86   Temp 98.1 F (36.7 C) (Oral)   Resp 16   Ht 5\' 2"  (1.575 m)   Wt 67.1 kg   SpO2 100%   BMI 27.06 kg/m   General: NAD, aaox3 Cardiovascular: RRR Respiratory: CTABL   Discharge Instructions You were cared for by a hospitalist during your hospital stay. If you have any questions about your discharge medications or the care you received while you were in the hospital after you are discharged, you can call the unit and asked to speak with the hospitalist on call if the hospitalist that took care of you is not available. Once you are discharged, your primary care physician will handle any further medical issues. Please note that NO REFILLS for any discharge medications will be authorized once you are discharged, as it is imperative that you return to your primary care physician (or establish a relationship with a primary care physician if you do not have one) for your aftercare needs so that they can reassess your need for medications and monitor your lab  values.  Discharge Instructions    Ambulatory referral to Neurology   Complete by:  As directed    Follow up with stroke clinic NP (Jessica Vanschaick or Darrol Angelarolyn Martin, if both not available, consider Manson AllanSethi, Penumali, or Ahern) [facility] about 4 weeks. Thanks.   Diet - low sodium heart healthy   Complete by:  As directed    Discharge instructions   Complete by:  As directed    Please continue to take medications as directed. If your symptoms return, worsen, or persist please call your 911, report to local ER, or contact crisis hotline. Please do not drink alcohol or use any illegal substances while taking prescription medications.   Increase activity slowly   Complete by:  As directed    Increase activity slowly   Complete by:  As directed      Allergies as of 12/08/2018      Reactions   Latex Hives      Medication List    STOP taking these medications   cetirizine 5 MG tablet Commonly known as:  ZYRTEC   meloxicam 7.5 MG tablet Commonly known as:  MOBIC     TAKE these medications   albuterol 108 (90 Base) MCG/ACT inhaler Commonly known as:  PROVENTIL HFA;VENTOLIN HFA Inhale 2 puffs into the lungs every 6 (six) hours as needed for wheezing or shortness of breath.   amLODipine  10 MG tablet Commonly known as:  NORVASC Take 1 tablet (10 mg total) by mouth daily.   atorvastatin 20 MG tablet Commonly known as:  LIPITOR Take 1 tablet (20 mg total) by mouth daily at 6 PM.   diclofenac sodium 1 % Gel Commonly known as:  VOLTAREN Apply 2 g topically 4 (four) times daily. As needed   lisinopril-hydrochlorothiazide 20-25 MG tablet Commonly known as:  PRINZIDE,ZESTORETIC Take 1 tablet by mouth daily.   polyethylene glycol packet Commonly known as:  MIRALAX / GLYCOLAX Take 17 g by mouth daily. Start taking on:  December 09, 2018   Rivaroxaban 15 & 20 MG Tbpk Take as directed on package: Start with one 15mg  tablet by mouth twice a day with food. On 12/25/2018, switch to one  20mg  tablet once a day with food.   senna-docusate 8.6-50 MG tablet Commonly known as:  Senokot-S Take 1 tablet by mouth at bedtime.            Durable Medical Equipment  (From admission, onward)         Start     Ordered   12/08/18 0936  For home use only DME Walker rolling  Once    Question:  Patient needs a walker to treat with the following condition  Answer:  Fear for personal safety   12/08/18 0935         Allergies  Allergen Reactions  . Latex Hives   Follow-up Information    [facility]. Schedule an appointment as soon as possible for a visit in 4 week(s).   Specialty:  Neurology Contact information: [address] [place] [address] [phone]       Claudean Severance, MD Follow up in 1 week(s).   Specialty:  Internal Medicine Why:  hospital discharge follow up, repeat cbc/bmp. pcp to refer you to have cancer screening test. Contact information: [address] [place] [address] [phone]            The results of significant diagnostics from this hospitalization (including imaging, microbiology, ancillary and laboratory) are listed below for reference.    Significant Diagnostic Studies: Ct Angio Head W Or Wo Contrast  Result Date: 12/05/2018 CLINICAL DATA:  Follow-up examination for acute stroke. EXAM: CT ANGIOGRAPHY HEAD AND NECK TECHNIQUE: Multidetector CT imaging of the head and neck was performed using the standard protocol during bolus administration of intravenous contrast. Multiplanar CT image reconstructions and MIPs were obtained to evaluate the vascular anatomy. Carotid stenosis measurements (when applicable) are obtained utilizing NASCET criteria, using the distal internal carotid diameter as the denominator. CONTRAST:  ISOVUE-370 IOPAMIDOL (ISOVUE-370) INJECTION 76% COMPARISON:  Prior MRI from earlier the same day as well as prior CT from 12/01/2018. FINDINGS: CT HEAD FINDINGS  Brain: Atrophy with chronic microvascular ischemic disease, stable. Previously identified 5 mm right basal ganglia infarct not well seen by CT. No other acute large vessel territory infarct. No acute intracranial hemorrhage. No mass lesion, midline shift or mass effect. No hydrocephalus. No extra-axial fluid collection. Vascular: No hyperdense vessel. Scattered vascular calcifications noted within the carotid siphons. Skull: Scalp soft tissues and calvarium within normal limits. Sinuses: Paranasal sinuses and mastoid air cells are clear. Orbits: Globes and orbital soft tissues demonstrate no acute finding. CTA NECK FINDINGS Aortic arch: Visualized aortic arch of normal caliber with normal 3 vessel morphology. No flow-limiting stenosis about the origin of the great vessels. Visualized subclavian arteries widely patent. Right carotid system: Right  common carotid artery patent from its origin to the bifurcation without stenosis. Eccentric calcified plaque at the origin of the right ICA with relatively mild no more than 25-30% narrowing by NASCET criteria. Right ICA widely patent distally to the skull base without stenosis, dissection, or occlusion. Left carotid system: Left common and internal carotid arteries mildly tortuous but widely patent to the skull base without stenosis, dissection, or occlusion. No significant atheromatous narrowing about the left carotid bifurcation. Vertebral arteries: Both of the vertebral arteries arise from the subclavian arteries. Vertebral arteries widely patent within the neck without stenosis, dissection, or occlusion. Skeleton: No acute osseous finding. No discrete lytic or blastic osseous lesions. Mild cervical spondylolysis at C4-5 and C5-6. Other neck: No other acute soft tissue abnormality within the neck. No adenopathy. Salivary glands within normal limits. Thyroid within normal limits. Upper chest: Visualized upper chest demonstrates no acute finding. Visualized lungs are  clear. Review of the MIP images confirms the above findings CTA HEAD FINDINGS Anterior circulation: Petrous segments widely patent bilaterally. Scattered atheromatous plaque within the cavernous/supraclinoid ICAs bilaterally. Associated short-segment moderate to advanced approximately 50-70% stenosis at the anterior genu of the cavernous right ICA. No significant narrowing seen on the left. ICA termini well perfused bilaterally. A1 segments widely patent. Normal anterior communicating artery. Anterior cerebral arteries widely patent to their distal aspects without stenosis. M1 segments demonstrate mild irregularity without high-grade stenosis. No proximal M2 occlusion. Distal MCA branches well perfused and symmetric. Posterior circulation: Vertebral arteries widely patent to the vertebrobasilar junction without stenosis. Left vertebral artery slightly dominant. Posterior inferior cerebral arteries patent bilaterally. Basilar widely patent to its distal aspect without stenosis. Superior cerebral arteries patent bilaterally. Both of the posterior cerebral arteries primarily supplied via the basilar. Atheromatous irregularity throughout the PCAs without high-grade flow-limiting stenosis. Venous sinuses: Patent. Anatomic variants: None significant. Delayed phase: No abnormal enhancement. Review of the MIP images confirms the above findings IMPRESSION: CT HEAD IMPRESSION 1. Stable head CT with no acute intracranial abnormality identified. Recently identified small right basal ganglia infarct not well visualized by CT. 2. No other new acute intracranial abnormality. CTA HEAD AND NECK IMPRESSION 1. Negative CTA for large vessel occlusion. 2. Atheromatous irregularity throughout the carotid siphons with short-segment moderate to advanced cavernous right ICA stenosis. 3. Atheromatous plaque about the right carotid bifurcation with no more than mild 30% stenosis. 4. Additional mild multifocal atheromatous irregularity  throughout the intracranial circulation. No other high-grade or correctable stenosis identified. Electronically Signed   By: Rise Mu M.D.   On: 12/05/2018 22:55   Dg Chest 2 View  Result Date: 11/22/2018 CLINICAL DATA:  Altered mental status. EXAM: CHEST - 2 VIEW COMPARISON:  07/27/2014. FINDINGS: Normal sized heart. Clear lungs. Mild thoracic spine degenerative changes. IMPRESSION: No acute abnormality. Electronically Signed   By: Beckie Salts M.D.   On: 11/22/2018 11:51   Ct Head Wo Contrast  Result Date: 12/01/2018 CLINICAL DATA:  Encephalopathy, altered mental status. EXAM: CT HEAD WITHOUT CONTRAST TECHNIQUE: Contiguous axial images were obtained from the base of the skull through the vertex without intravenous contrast. COMPARISON:  CT scan of November 29, 2018. FINDINGS: Brain: Mild chronic ischemic white matter disease is noted. No mass effect or midline shift is noted. Ventricular size is within normal limits. There is no evidence of mass lesion, hemorrhage or acute infarction. Vascular: No hyperdense vessel or unexpected calcification. Skull: Normal. Negative for fracture or focal lesion. Sinuses/Orbits: No acute finding. Other: None. IMPRESSION: Mild chronic ischemic white  matter disease. No acute intracranial abnormality seen. Electronically Signed   By: Lupita Raider, M.D.   On: 12/01/2018 14:49   Ct Head Wo Contrast  Result Date: 11/29/2018 CLINICAL DATA:  63 year old female with history of fall and apparent loss of consciousness. Syncopal event. EXAM: CT HEAD WITHOUT CONTRAST CT CERVICAL SPINE WITHOUT CONTRAST TECHNIQUE: Multidetector CT imaging of the head and cervical spine was performed following the standard protocol without intravenous contrast. Multiplanar CT image reconstructions of the cervical spine were also generated. COMPARISON:  None. FINDINGS: CT HEAD FINDINGS Brain: Patchy and confluent areas of decreased attenuation are noted throughout the deep and  periventricular white matter of the cerebral hemispheres bilaterally, compatible with chronic microvascular ischemic disease. Mild physiologic calcifications in the basal ganglia bilaterally no evidence of acute infarction, hemorrhage, hydrocephalus, extra-axial collection or mass lesion/mass effect. Vascular: No hyperdense vessel or unexpected calcification. Skull: Normal. Negative for fracture or focal lesion. Sinuses/Orbits: No acute finding. Other: None. CT CERVICAL SPINE FINDINGS Alignment: Normal. Skull base and vertebrae: No acute fracture. No primary bone lesion or focal pathologic process. Soft tissues and spinal canal: No prevertebral fluid or swelling. No visible canal hematoma. Disc levels: Multilevel degenerative disc disease, most pronounced at C4-C5 and C5-C6. Mild multilevel facet arthropathy. Upper chest: Mild emphysematous changes. Other: Unremarkable. IMPRESSION: 1. No evidence of significant acute traumatic injury to the skull, brain or cervical spine. 2. Chronic microvascular ischemic changes in the cerebral white matter redemonstrated, as above. 3. Multilevel degenerative disc disease and cervical spondylosis, as above. Electronically Signed   By: Trudie Reed M.D.   On: 11/29/2018 14:02   Ct Head Wo Contrast  Result Date: 11/21/2018 CLINICAL DATA:  Altered level of consciousness EXAM: CT HEAD WITHOUT CONTRAST TECHNIQUE: Contiguous axial images were obtained from the base of the skull through the vertex without intravenous contrast. COMPARISON:  09/07/2014 FINDINGS: Brain: No evidence of acute infarction, hemorrhage, hydrocephalus, extra-axial collection or mass lesion/mass effect. Mild subcortical white matter and periventricular small vessel ischemic changes. Vascular: No hyperdense vessel or unexpected calcification. Skull: Normal. Negative for fracture or focal lesion. Sinuses/Orbits: The visualized paranasal sinuses are essentially clear. The mastoid air cells are unopacified.  Other: None. IMPRESSION: No evidence of acute intracranial abnormality. Mild small vessel ischemic changes. Electronically Signed   By: Charline Bills M.D.   On: 11/21/2018 15:17   Ct Angio Neck W Or Wo Contrast  Result Date: 12/05/2018 CLINICAL DATA:  Follow-up examination for acute stroke. EXAM: CT ANGIOGRAPHY HEAD AND NECK TECHNIQUE: Multidetector CT imaging of the head and neck was performed using the standard protocol during bolus administration of intravenous contrast. Multiplanar CT image reconstructions and MIPs were obtained to evaluate the vascular anatomy. Carotid stenosis measurements (when applicable) are obtained utilizing NASCET criteria, using the distal internal carotid diameter as the denominator. CONTRAST:  ISOVUE-370 IOPAMIDOL (ISOVUE-370) INJECTION 76% COMPARISON:  Prior MRI from earlier the same day as well as prior CT from 12/01/2018. FINDINGS: CT HEAD FINDINGS Brain: Atrophy with chronic microvascular ischemic disease, stable. Previously identified 5 mm right basal ganglia infarct not well seen by CT. No other acute large vessel territory infarct. No acute intracranial hemorrhage. No mass lesion, midline shift or mass effect. No hydrocephalus. No extra-axial fluid collection. Vascular: No hyperdense vessel. Scattered vascular calcifications noted within the carotid siphons. Skull: Scalp soft tissues and calvarium within normal limits. Sinuses: Paranasal sinuses and mastoid air cells are clear. Orbits: Globes and orbital soft tissues demonstrate no acute finding. CTA NECK FINDINGS  Aortic arch: Visualized aortic arch of normal caliber with normal 3 vessel morphology. No flow-limiting stenosis about the origin of the great vessels. Visualized subclavian arteries widely patent. Right carotid system: Right common carotid artery patent from its origin to the bifurcation without stenosis. Eccentric calcified plaque at the origin of the right ICA with relatively mild no more than 25-30%  narrowing by NASCET criteria. Right ICA widely patent distally to the skull base without stenosis, dissection, or occlusion. Left carotid system: Left common and internal carotid arteries mildly tortuous but widely patent to the skull base without stenosis, dissection, or occlusion. No significant atheromatous narrowing about the left carotid bifurcation. Vertebral arteries: Both of the vertebral arteries arise from the subclavian arteries. Vertebral arteries widely patent within the neck without stenosis, dissection, or occlusion. Skeleton: No acute osseous finding. No discrete lytic or blastic osseous lesions. Mild cervical spondylolysis at C4-5 and C5-6. Other neck: No other acute soft tissue abnormality within the neck. No adenopathy. Salivary glands within normal limits. Thyroid within normal limits. Upper chest: Visualized upper chest demonstrates no acute finding. Visualized lungs are clear. Review of the MIP images confirms the above findings CTA HEAD FINDINGS Anterior circulation: Petrous segments widely patent bilaterally. Scattered atheromatous plaque within the cavernous/supraclinoid ICAs bilaterally. Associated short-segment moderate to advanced approximately 50-70% stenosis at the anterior genu of the cavernous right ICA. No significant narrowing seen on the left. ICA termini well perfused bilaterally. A1 segments widely patent. Normal anterior communicating artery. Anterior cerebral arteries widely patent to their distal aspects without stenosis. M1 segments demonstrate mild irregularity without high-grade stenosis. No proximal M2 occlusion. Distal MCA branches well perfused and symmetric. Posterior circulation: Vertebral arteries widely patent to the vertebrobasilar junction without stenosis. Left vertebral artery slightly dominant. Posterior inferior cerebral arteries patent bilaterally. Basilar widely patent to its distal aspect without stenosis. Superior cerebral arteries patent bilaterally. Both  of the posterior cerebral arteries primarily supplied via the basilar. Atheromatous irregularity throughout the PCAs without high-grade flow-limiting stenosis. Venous sinuses: Patent. Anatomic variants: None significant. Delayed phase: No abnormal enhancement. Review of the MIP images confirms the above findings IMPRESSION: CT HEAD IMPRESSION 1. Stable head CT with no acute intracranial abnormality identified. Recently identified small right basal ganglia infarct not well visualized by CT. 2. No other new acute intracranial abnormality. CTA HEAD AND NECK IMPRESSION 1. Negative CTA for large vessel occlusion. 2. Atheromatous irregularity throughout the carotid siphons with short-segment moderate to advanced cavernous right ICA stenosis. 3. Atheromatous plaque about the right carotid bifurcation with no more than mild 30% stenosis. 4. Additional mild multifocal atheromatous irregularity throughout the intracranial circulation. No other high-grade or correctable stenosis identified. Electronically Signed   By: Rise MuBenjamin  McClintock M.D.   On: 12/05/2018 22:55   Ct Angio Chest Pe W Or Wo Contrast  Result Date: 12/02/2018 CLINICAL DATA:  Chest pain and shortness of breath. EXAM: CT ANGIOGRAPHY CHEST WITH CONTRAST TECHNIQUE: Multidetector CT imaging of the chest was performed using the standard protocol during bolus administration of intravenous contrast. Multiplanar CT image reconstructions and MIPs were obtained to evaluate the vascular anatomy. CONTRAST:  100mL ISOVUE-370 IOPAMIDOL (ISOVUE-370) INJECTION 76% COMPARISON:  Chest x-ray 11/29/2018 FINDINGS: Cardiovascular: The heart is mildly enlarged. Evidence of significant right heart strain with markedly abnormal RV LV ratio. The aorta is normal in caliber. No dissection. No atherosclerotic calcifications. The branch vessels are patent. No definite coronary artery calcifications. The pulmonary arterial tree is fairly well opacified. There is sub massive bilateral  pulmonary emboli  with saddle emboli in the main right pulmonary artery. This is causing significant right heart strain with a dilated right ventricle and mass effect on the left ventricle. Mediastinum/Nodes: No mediastinal or hilar mass or adenopathy. The esophagus is grossly normal. Lungs/Pleura: There are moderate-sized bilateral pleural effusions with overlying atelectasis. No obvious pulmonary infarcts. Upper Abdomen: The upper abdomen is unremarkable. Musculoskeletal: No significant bony findings. Review of the MIP images confirms the above findings. IMPRESSION: 1. Positive for acute sub massive PE with CT evidence of right heart strain (RV/LV Ratio = 1.6) consistent with at least submassive (intermediate risk) PE. The presence of right heart strain has been associated with an increased risk of morbidity and mortality. Please activate Code PE by paging [phone]. 2. Bilateral pleural effusions and bibasilar atelectasis. 3. Normal thoracic aorta. These results were called by telephone at the time of interpretation on 12/02/2018 at 9:39 am to Dr. Marinda Elk , who verbally acknowledged these results. Electronically Signed   By: Rudie Meyer M.D.   On: 12/02/2018 09:40   Ct Cervical Spine Wo Contrast  Result Date: 11/29/2018 CLINICAL DATA:  63 year old female with history of fall and apparent loss of consciousness. Syncopal event. EXAM: CT HEAD WITHOUT CONTRAST CT CERVICAL SPINE WITHOUT CONTRAST TECHNIQUE: Multidetector CT imaging of the head and cervical spine was performed following the standard protocol without intravenous contrast. Multiplanar CT image reconstructions of the cervical spine were also generated. COMPARISON:  None. FINDINGS: CT HEAD FINDINGS Brain: Patchy and confluent areas of decreased attenuation are noted throughout the deep and periventricular white matter of the cerebral hemispheres bilaterally, compatible with chronic microvascular ischemic disease. Mild physiologic  calcifications in the basal ganglia bilaterally no evidence of acute infarction, hemorrhage, hydrocephalus, extra-axial collection or mass lesion/mass effect. Vascular: No hyperdense vessel or unexpected calcification. Skull: Normal. Negative for fracture or focal lesion. Sinuses/Orbits: No acute finding. Other: None. CT CERVICAL SPINE FINDINGS Alignment: Normal. Skull base and vertebrae: No acute fracture. No primary bone lesion or focal pathologic process. Soft tissues and spinal canal: No prevertebral fluid or swelling. No visible canal hematoma. Disc levels: Multilevel degenerative disc disease, most pronounced at C4-C5 and C5-C6. Mild multilevel facet arthropathy. Upper chest: Mild emphysematous changes. Other: Unremarkable. IMPRESSION: 1. No evidence of significant acute traumatic injury to the skull, brain or cervical spine. 2. Chronic microvascular ischemic changes in the cerebral white matter redemonstrated, as above. 3. Multilevel degenerative disc disease and cervical spondylosis, as above. Electronically Signed   By: Trudie Reed M.D.   On: 11/29/2018 14:02   Mr Brain Wo Contrast  Result Date: 12/04/2018 CLINICAL DATA:  63 y/o  F; acute psychosis. EXAM: MRI HEAD WITHOUT CONTRAST TECHNIQUE: Multiplanar, multiecho pulse sequences of the brain and surrounding structures were obtained without intravenous contrast. COMPARISON:  12/01/2017 CT head. FINDINGS: Brain: 5 mm focus of reduced diffusion within the right globus pallidus with increased T2 signal (series 4, image 25 and series 9, image 26). No associated hemorrhage or mass effect. Several nonspecific T2 FLAIR hyperintensities in subcortical and periventricular white matter are compatible with mild chronic microvascular ischemic changes for age. Mild volume loss of the brain. No extra-axial collection, hydrocephalus, mass effect, or herniation. Vascular: Normal flow voids. Skull and upper cervical spine: Normal marrow signal. Sinuses/Orbits: Mild  mucosal thickening of the paranasal sinuses. No abnormal signal of the mastoid air cells. Orbits are unremarkable. Other: None. IMPRESSION: 1. 5 mm focus of reduced diffusion within the right globus pallidus probably representing an acute/early subacute lacunar  infarction. No associated hemorrhage or mass effect. 2. Mild chronic microvascular ischemic changes and volume loss of the brain. These results will be called to the ordering clinician or representative by the Radiologist Assistant, and communication documented in the PACS or zVision Dashboard. Electronically Signed   By: Mitzi Hansen M.D.   On: 12/04/2018 17:19   US Renal  Result Date: 11/29/2018 CLINICAL DATA:  Acute renal failure EXAM: RENAL / URINARY TRACT ULTRASOUND COMPLETE COMPARISON:  None. FINDINGS: Right Kidney: Renal measurements: 9.5 x 4.8 x 5.4 cm = volume: 128.5 mL . Echogenicity within normal limits. There is a 1 mm nonobstructing stone in the midpole right kidney. No mass or hydronephrosis visualized. Left Kidney: Renal measurements: 10 x 5.7 x 5.1 cm = volume: 151.9 mL. Echogenicity within normal limits. No mass or hydronephrosis visualized. Bladder: The bladder is decompressed limiting evaluation. IMPRESSION: No acute abnormality identified. 1 mm nonobstructing stone in midpole right kidney. No hydronephrosis is identified bilaterally. Electronically Signed   By: Sherian Rein M.D.   On: 11/29/2018 16:51   Dg Chest Port 1 View  Result Date: 11/29/2018 CLINICAL DATA:  Acute respiratory failure. EXAM: PORTABLE CHEST 1 VIEW COMPARISON:  11/22/2018 FINDINGS: Heart size is normal. Pulmonary vessels are slightly prominent and there is slight distention of the azygos vein, consistent with a supine position. No infiltrates or effusions. No acute bone abnormality. IMPRESSION: No active disease. Electronically Signed   By: Francene Boyers M.D.   On: 11/29/2018 17:31   Vas [place] Lower Extremity Venous (dvt)  Result Date:  12/01/2018  Lower Venous Study Indications: Pain, and Swelling.  Limitations: Body habitus, line and bandages. Comparison Study: No prior study available Performing Technologist: Melodie Bouillon  Examination Guidelines: A complete evaluation includes B-mode imaging, spectral Doppler, color Doppler, and power Doppler as needed of all accessible portions of each vessel. Bilateral testing is considered an integral part of a complete examination. Limited examinations for reoccurring indications may be performed as noted.  Right Venous Findings: +---------+---------------+---------+-----------+----------+-------------------+          CompressibilityPhasicitySpontaneityPropertiesSummary             +---------+---------------+---------+-----------+----------+-------------------+ CFV      Full           Yes      Yes                  very pulsatile flow +---------+---------------+---------+-----------+----------+-------------------+ SFJ      Full                                                             +---------+---------------+---------+-----------+----------+-------------------+ FV Prox  Full                    Yes                  very poor flow                                                            detected            +---------+---------------+---------+-----------+----------+-------------------+ FV Mid  Full                                                             +---------+---------------+---------+-----------+----------+-------------------+ FV DistalFull                                         very poor flow,                                                           nearly absent       +---------+---------------+---------+-----------+----------+-------------------+ PFV      Full                                                             +---------+---------------+---------+-----------+----------+-------------------+ POP      Full           No        Yes                  very poor flow,                                                           nearly absent       +---------+---------------+---------+-----------+----------+-------------------+ PTV      None                    No                   Acute               +---------+---------------+---------+-----------+----------+-------------------+ PERO     None                    No                   Acute               +---------+---------------+---------+-----------+----------+-------------------+ GSV      Full                                                             +---------+---------------+---------+-----------+----------+-------------------+  Left Venous Findings: +---------+---------------+---------+-----------+----------+-------------------+          CompressibilityPhasicitySpontaneityPropertiesSummary             +---------+---------------+---------+-----------+----------+-------------------+ CFV      Full           Yes      Yes                                      +---------+---------------+---------+-----------+----------+-------------------+  SFJ      Full                                                             +---------+---------------+---------+-----------+----------+-------------------+ FV Prox  Full                                                             +---------+---------------+---------+-----------+----------+-------------------+ FV Mid   Full                                                             +---------+---------------+---------+-----------+----------+-------------------+ FV DistalFull                                                             +---------+---------------+---------+-----------+----------+-------------------+ PFV      Full                                                             +---------+---------------+---------+-----------+----------+-------------------+ POP       Full                    Yes                  very poor flow,                                                           nearly absent       +---------+---------------+---------+-----------+----------+-------------------+ PTV      None                    No                   Acute               +---------+---------------+---------+-----------+----------+-------------------+ PERO     None                    No                   Acute               +---------+---------------+---------+-----------+----------+-------------------+    Summary: Right: Findings consistent with acute deep vein thrombosis involving the right peroneal vein, and right posterior tibial vein. No cystic structure found in the popliteal fossa. Left: Findings consistent with  acute deep vein thrombosis involving the left posterior tibial vein, and left peroneal vein. No cystic structure found in the popliteal fossa.  *See table(s) above for measurements and observations. Electronically signed by Gretta Began MD on 12/01/2018 at 5:50:31 PM.    Final     Microbiology: Recent Results (from the past 240 hour(s))  Gastrointestinal Panel by PCR , Stool     Status: None   Collection Time: 11/29/18  2:16 PM  Result Value Ref Range Status   Campylobacter species NOT DETECTED NOT DETECTED Final   Plesimonas shigelloides NOT DETECTED NOT DETECTED Final   Salmonella species NOT DETECTED NOT DETECTED Final   Yersinia enterocolitica NOT DETECTED NOT DETECTED Final   Vibrio species NOT DETECTED NOT DETECTED Final   Vibrio cholerae NOT DETECTED NOT DETECTED Final   Enteroaggregative E coli (EAEC) NOT DETECTED NOT DETECTED Final   Enteropathogenic E coli (EPEC) NOT DETECTED NOT DETECTED Final   Enterotoxigenic E coli (ETEC) NOT DETECTED NOT DETECTED Final   Shiga like toxin producing E coli (STEC) NOT DETECTED NOT DETECTED Final   Shigella/Enteroinvasive E coli (EIEC) NOT DETECTED NOT DETECTED Final   Cryptosporidium NOT  DETECTED NOT DETECTED Final   Cyclospora cayetanensis NOT DETECTED NOT DETECTED Final   Entamoeba histolytica NOT DETECTED NOT DETECTED Final   Giardia lamblia NOT DETECTED NOT DETECTED Final   Adenovirus F40/41 NOT DETECTED NOT DETECTED Final   Astrovirus NOT DETECTED NOT DETECTED Final   Norovirus GI/GII NOT DETECTED NOT DETECTED Final   Rotavirus A NOT DETECTED NOT DETECTED Final   Sapovirus (I, II, IV, and V) NOT DETECTED NOT DETECTED Final    Comment: Performed at [facility], [address] [place], [place] [address]  Culture, blood (routine x 2)     Status: None   Collection Time: 11/29/18  2:17 PM  Result Value Ref Range Status   Specimen Description BLOOD RIGHT ANTECUBITAL  Final   Special Requests   Final    BOTTLES DRAWN AEROBIC ONLY Blood Culture adequate volume Performed at [facility], [address], [place], [place] [address]    Culture NO GROWTH 5 DAYS  Final   Report Status 12/04/2018 FINAL  Final  Culture, Urine     Status: None   Collection Time: 11/29/18  3:44 PM  Result Value Ref Range Status   Specimen Description   Final    URINE, CATHETERIZED Performed at [facility], [address] [place], [place] [address]    Special Requests   Final    NONE Performed at [facility], [address] [place], [place] [address]    Culture   Final    NO GROWTH Performed at [facility], [address] [place], [place] [address]    Report Status 12/01/2018 FINAL  Final  Culture, blood (routine x 2)     Status: Abnormal   Collection Time: 11/29/18  4:00 PM  Result Value Ref Range Status   Specimen Description   Final    BLOOD CENTRAL LINE Performed at [facility], [address], [place], [place] [address]    Special Requests   Final    BOTTLES DRAWN AEROBIC AND ANAEROBIC Blood Culture adequate volume Performed at [facility], [address]  [place], [place] [address]    Culture  Setup Time   Final    GRAM POSITIVE COCCI IN BOTH AEROBIC AND ANAEROBIC BOTTLES CRITICAL RESULT CALLED TO, READ  BACK BY AND VERIFIED WITH: Damaris Hippo, PHARMD ([facility]) AT 1515 ON 11/30/18 BY C. JESSUP, MLT.    Culture (A)  Final    STAPHYLOCOCCUS SPECIES (COAGULASE NEGATIVE) THE SIGNIFICANCE OF ISOLATING THIS ORGANISM FROM A SINGLE SET OF BLOOD CULTURES WHEN MULTIPLE SETS ARE DRAWN IS UNCERTAIN. PLEASE NOTIFY THE MICROBIOLOGY DEPARTMENT WITHIN ONE WEEK IF SPECIATION AND SENSITIVITIES ARE REQUIRED. Performed at [facility], [address] [place], [place] [address]    Report Status 12/04/2018 FINAL  Final  Blood Culture ID Panel (Reflexed)     Status: Abnormal   Collection Time: 11/29/18  4:00 PM  Result Value Ref Range Status   Enterococcus species NOT DETECTED NOT DETECTED Final   Listeria monocytogenes NOT DETECTED NOT DETECTED Final   Staphylococcus species DETECTED (A) NOT DETECTED Final    Comment: Methicillin (oxacillin) resistant coagulase negative staphylococcus. Possible blood culture contaminant (unless isolated from more than one blood culture draw or clinical case suggests pathogenicity). No antibiotic treatment is indicated for blood  culture contaminants. CRITICAL RESULT CALLED TO, READ BACK BY AND VERIFIED WITH: Damaris Hippo, PHARMD AT 1515 ON 11/30/18 BY C. JESSUP, MLT.    Staphylococcus aureus (BCID) NOT DETECTED NOT DETECTED Final   Methicillin resistance DETECTED (A) NOT DETECTED Final    Comment: CRITICAL RESULT CALLED TO, READ BACK BY AND VERIFIED WITH: M. LILLISTON, PHARMD ([facility]) AT 1515 ON 11/30/18 BY C. JESSUP, MLT.    Streptococcus species NOT DETECTED NOT DETECTED Final   Streptococcus agalactiae NOT DETECTED NOT DETECTED Final   Streptococcus pneumoniae NOT DETECTED NOT DETECTED Final   Streptococcus pyogenes NOT DETECTED NOT DETECTED Final   Acinetobacter baumannii NOT DETECTED NOT DETECTED Final   Enterobacteriaceae species  NOT DETECTED NOT DETECTED Final   Enterobacter cloacae complex NOT DETECTED NOT DETECTED Final   Escherichia coli NOT DETECTED NOT DETECTED Final   Klebsiella oxytoca NOT DETECTED NOT DETECTED Final   Klebsiella pneumoniae NOT DETECTED NOT DETECTED Final   Proteus species NOT DETECTED NOT DETECTED Final   Serratia marcescens NOT DETECTED NOT DETECTED Final   Haemophilus influenzae NOT DETECTED NOT DETECTED Final   Neisseria meningitidis NOT DETECTED NOT DETECTED Final   Pseudomonas aeruginosa NOT DETECTED NOT DETECTED Final   Candida albicans NOT DETECTED NOT DETECTED Final   Candida glabrata NOT DETECTED NOT DETECTED Final   Candida krusei NOT DETECTED NOT DETECTED Final   Candida parapsilosis NOT DETECTED NOT DETECTED Final   Candida tropicalis NOT DETECTED NOT DETECTED Final    Comment: Performed at [facility], [address] [place], [place] [address]  MRSA PCR Screening     Status: None   Collection Time: 11/29/18  6:45 PM  Result Value Ref Range Status   MRSA by PCR NEGATIVE NEGATIVE Final    Comment:        The GeneXpert MRSA Assay (FDA approved for NASAL specimens only), is one component of a comprehensive MRSA colonization surveillance program. It is not intended to diagnose MRSA infection nor to guide or monitor treatment for MRSA infections. Performed at [facility], [address], [place], [place] [address]   Culture, blood (routine x 2)     Status: None   Collection Time: 12/01/18 10:52 AM  Result Value Ref Range Status   Specimen Description   Final    BLOOD LEFT ARM Performed at [facility], [address], [place], [place] [address]    Special Requests   Final  BOTTLES DRAWN AEROBIC ONLY Blood Culture adequate volume Performed at [facility], [address] [place], [place] [address]    Culture   Final    NO GROWTH 5 DAYS Performed at [facility], [address]  [place], [place] [address]    Report Status 12/06/2018 FINAL  Final  Culture, blood (routine x 2)     Status: None   Collection Time: 12/01/18 10:52 AM  Result Value Ref Range Status   Specimen Description   Final    BLOOD LEFT HAND Performed at [facility], [address], [place], [place] [address]    Special Requests   Final    BOTTLES DRAWN AEROBIC ONLY Blood Culture adequate volume Performed at [facility], [address], [place], [place] [address]    Culture   Final    NO GROWTH 5 DAYS Performed at [facility], [address], [place], [place] [address]    Report Status 12/06/2018 FINAL  Final     Labs: Basic Metabolic Panel: Recent Labs  Lab 12/02/18 0612 12/03/18 0512 12/04/18 0531 12/05/18 0508 12/08/18 0421  NA 142 147* 143 140 138  K 3.0* 3.4* 3.5 3.4* 3.4*  CL 105 110 107 107 106  CO2 27 28 26 25 24   GLUCOSE 91 95 85 95 91  BUN 15 8 5* 8 10  CREATININE 0.73 0.70 0.67 0.81 0.71  CALCIUM 7.8* 8.2* 8.5* 8.3* 8.1*  MG 2.2  --   --   --  2.0  PHOS 1.7*  --   --   --   --    Liver Function Tests: No results for input(s): AST, ALT, ALKPHOS, BILITOT, PROT, ALBUMIN in the last 168 hours. No results for input(s): LIPASE, AMYLASE in the last 168 hours. No results for input(s): AMMONIA in the last 168 hours. CBC: Recent Labs  Lab 12/02/18 0309 12/03/18 0512 12/04/18 0531 12/05/18 0508 12/08/18 0421  WBC 9.8 8.8 11.9* 14.1* 8.5  NEUTROABS  --   --   --   --  4.3  HGB 11.5* 12.1 13.8 12.2 11.2*  HCT 36.8 39.2 44.9 38.6 36.5  MCV 85.2 84.5 85.5 85.6 87.3  PLT 139* 209 223 213 355   Cardiac Enzymes: No results for input(s): CKTOTAL, CKMB, CKMBINDEX, TROPONINI in the last 168 hours. BNP: BNP (last 3 results) Recent Labs    11/30/18 0313  BNP 177.9*    ProBNP (last 3 results) No results for input(s): PROBNP in the last 8760 hours.  CBG: Recent Labs  Lab 12/02/18 0812 12/02/18 1014 12/02/18 1054  12/02/18 1226 12/02/18 1704  GLUCAP 99 66* 87 112* 106*       Signed:  Albertine Grates MD, PhD  [facility] 12/08/2018, 12:30 PM

## 2018-12-09 ENCOUNTER — Telehealth: Payer: Self-pay | Admitting: *Deleted

## 2018-12-09 DIAGNOSIS — J45909 Unspecified asthma, uncomplicated: Secondary | ICD-10-CM | POA: Diagnosis not present

## 2018-12-09 DIAGNOSIS — F23 Brief psychotic disorder: Secondary | ICD-10-CM | POA: Diagnosis not present

## 2018-12-09 DIAGNOSIS — G9341 Metabolic encephalopathy: Secondary | ICD-10-CM | POA: Diagnosis not present

## 2018-12-09 DIAGNOSIS — Z9181 History of falling: Secondary | ICD-10-CM | POA: Diagnosis not present

## 2018-12-09 DIAGNOSIS — I499 Cardiac arrhythmia, unspecified: Secondary | ICD-10-CM | POA: Diagnosis not present

## 2018-12-09 DIAGNOSIS — I2699 Other pulmonary embolism without acute cor pulmonale: Secondary | ICD-10-CM | POA: Diagnosis not present

## 2018-12-09 DIAGNOSIS — Z7901 Long term (current) use of anticoagulants: Secondary | ICD-10-CM | POA: Diagnosis not present

## 2018-12-09 DIAGNOSIS — I69351 Hemiplegia and hemiparesis following cerebral infarction affecting right dominant side: Secondary | ICD-10-CM | POA: Diagnosis not present

## 2018-12-09 DIAGNOSIS — J9600 Acute respiratory failure, unspecified whether with hypoxia or hypercapnia: Secondary | ICD-10-CM | POA: Diagnosis not present

## 2018-12-09 DIAGNOSIS — I82403 Acute embolism and thrombosis of unspecified deep veins of lower extremity, bilateral: Secondary | ICD-10-CM | POA: Diagnosis not present

## 2018-12-09 NOTE — Telephone Encounter (Signed)
Call Claris Pong, RN at [facility] - stated they will be seeing pt for nurse visits, PT/OT, ST and social worker Informed PCP is Dr Gwyneth Revels  Who will be signing any orders.  Also stated she add 4 more nurse visits for medication management. Pt had recent CVA. Verbal order given. If plan not appropriate, let me know.

## 2018-12-10 DIAGNOSIS — J9600 Acute respiratory failure, unspecified whether with hypoxia or hypercapnia: Secondary | ICD-10-CM | POA: Diagnosis not present

## 2018-12-10 DIAGNOSIS — I2699 Other pulmonary embolism without acute cor pulmonale: Secondary | ICD-10-CM | POA: Diagnosis not present

## 2018-12-10 DIAGNOSIS — I82403 Acute embolism and thrombosis of unspecified deep veins of lower extremity, bilateral: Secondary | ICD-10-CM | POA: Diagnosis not present

## 2018-12-10 DIAGNOSIS — G9341 Metabolic encephalopathy: Secondary | ICD-10-CM | POA: Diagnosis not present

## 2018-12-10 DIAGNOSIS — F23 Brief psychotic disorder: Secondary | ICD-10-CM | POA: Diagnosis not present

## 2018-12-10 DIAGNOSIS — I69351 Hemiplegia and hemiparesis following cerebral infarction affecting right dominant side: Secondary | ICD-10-CM | POA: Diagnosis not present

## 2018-12-12 DIAGNOSIS — I82403 Acute embolism and thrombosis of unspecified deep veins of lower extremity, bilateral: Secondary | ICD-10-CM | POA: Diagnosis not present

## 2018-12-12 DIAGNOSIS — F23 Brief psychotic disorder: Secondary | ICD-10-CM | POA: Diagnosis not present

## 2018-12-12 DIAGNOSIS — I69351 Hemiplegia and hemiparesis following cerebral infarction affecting right dominant side: Secondary | ICD-10-CM | POA: Diagnosis not present

## 2018-12-12 DIAGNOSIS — G9341 Metabolic encephalopathy: Secondary | ICD-10-CM | POA: Diagnosis not present

## 2018-12-12 DIAGNOSIS — J9600 Acute respiratory failure, unspecified whether with hypoxia or hypercapnia: Secondary | ICD-10-CM | POA: Diagnosis not present

## 2018-12-12 DIAGNOSIS — I2699 Other pulmonary embolism without acute cor pulmonale: Secondary | ICD-10-CM | POA: Diagnosis not present

## 2018-12-13 ENCOUNTER — Encounter: Payer: Self-pay | Admitting: Internal Medicine

## 2018-12-13 ENCOUNTER — Telehealth: Payer: Self-pay | Admitting: *Deleted

## 2018-12-13 DIAGNOSIS — I2699 Other pulmonary embolism without acute cor pulmonale: Secondary | ICD-10-CM | POA: Diagnosis not present

## 2018-12-13 DIAGNOSIS — I82403 Acute embolism and thrombosis of unspecified deep veins of lower extremity, bilateral: Secondary | ICD-10-CM | POA: Diagnosis not present

## 2018-12-13 DIAGNOSIS — F23 Brief psychotic disorder: Secondary | ICD-10-CM | POA: Diagnosis not present

## 2018-12-13 DIAGNOSIS — I69351 Hemiplegia and hemiparesis following cerebral infarction affecting right dominant side: Secondary | ICD-10-CM | POA: Diagnosis not present

## 2018-12-13 DIAGNOSIS — J9600 Acute respiratory failure, unspecified whether with hypoxia or hypercapnia: Secondary | ICD-10-CM | POA: Diagnosis not present

## 2018-12-13 DIAGNOSIS — G9341 Metabolic encephalopathy: Secondary | ICD-10-CM | POA: Diagnosis not present

## 2018-12-13 NOTE — Telephone Encounter (Signed)
VO for OT 2x week for 1 week, 0week for 1 week, 1xweek for 2 weeks for safety, ADL's, home management,etc. Do you agree?

## 2018-12-13 NOTE — Telephone Encounter (Signed)
Yes, I agree. Thank you!

## 2018-12-14 DIAGNOSIS — G9341 Metabolic encephalopathy: Secondary | ICD-10-CM | POA: Diagnosis not present

## 2018-12-14 DIAGNOSIS — F23 Brief psychotic disorder: Secondary | ICD-10-CM | POA: Diagnosis not present

## 2018-12-14 DIAGNOSIS — I2699 Other pulmonary embolism without acute cor pulmonale: Secondary | ICD-10-CM | POA: Diagnosis not present

## 2018-12-14 DIAGNOSIS — I69351 Hemiplegia and hemiparesis following cerebral infarction affecting right dominant side: Secondary | ICD-10-CM | POA: Diagnosis not present

## 2018-12-14 DIAGNOSIS — I82403 Acute embolism and thrombosis of unspecified deep veins of lower extremity, bilateral: Secondary | ICD-10-CM | POA: Diagnosis not present

## 2018-12-14 DIAGNOSIS — J9600 Acute respiratory failure, unspecified whether with hypoxia or hypercapnia: Secondary | ICD-10-CM | POA: Diagnosis not present

## 2018-12-15 DIAGNOSIS — J9600 Acute respiratory failure, unspecified whether with hypoxia or hypercapnia: Secondary | ICD-10-CM | POA: Diagnosis not present

## 2018-12-15 DIAGNOSIS — I2699 Other pulmonary embolism without acute cor pulmonale: Secondary | ICD-10-CM | POA: Diagnosis not present

## 2018-12-15 DIAGNOSIS — I82403 Acute embolism and thrombosis of unspecified deep veins of lower extremity, bilateral: Secondary | ICD-10-CM | POA: Diagnosis not present

## 2018-12-15 DIAGNOSIS — F23 Brief psychotic disorder: Secondary | ICD-10-CM | POA: Diagnosis not present

## 2018-12-15 DIAGNOSIS — G9341 Metabolic encephalopathy: Secondary | ICD-10-CM | POA: Diagnosis not present

## 2018-12-15 DIAGNOSIS — I69351 Hemiplegia and hemiparesis following cerebral infarction affecting right dominant side: Secondary | ICD-10-CM | POA: Diagnosis not present

## 2018-12-16 DIAGNOSIS — I2699 Other pulmonary embolism without acute cor pulmonale: Secondary | ICD-10-CM | POA: Diagnosis not present

## 2018-12-16 DIAGNOSIS — G9341 Metabolic encephalopathy: Secondary | ICD-10-CM | POA: Diagnosis not present

## 2018-12-16 DIAGNOSIS — J9600 Acute respiratory failure, unspecified whether with hypoxia or hypercapnia: Secondary | ICD-10-CM | POA: Diagnosis not present

## 2018-12-16 DIAGNOSIS — I69351 Hemiplegia and hemiparesis following cerebral infarction affecting right dominant side: Secondary | ICD-10-CM | POA: Diagnosis not present

## 2018-12-16 DIAGNOSIS — F23 Brief psychotic disorder: Secondary | ICD-10-CM | POA: Diagnosis not present

## 2018-12-16 DIAGNOSIS — I82403 Acute embolism and thrombosis of unspecified deep veins of lower extremity, bilateral: Secondary | ICD-10-CM | POA: Diagnosis not present

## 2018-12-19 DIAGNOSIS — F23 Brief psychotic disorder: Secondary | ICD-10-CM | POA: Diagnosis not present

## 2018-12-19 DIAGNOSIS — G9341 Metabolic encephalopathy: Secondary | ICD-10-CM | POA: Diagnosis not present

## 2018-12-19 DIAGNOSIS — I69351 Hemiplegia and hemiparesis following cerebral infarction affecting right dominant side: Secondary | ICD-10-CM | POA: Diagnosis not present

## 2018-12-19 DIAGNOSIS — I2699 Other pulmonary embolism without acute cor pulmonale: Secondary | ICD-10-CM | POA: Diagnosis not present

## 2018-12-19 DIAGNOSIS — J9600 Acute respiratory failure, unspecified whether with hypoxia or hypercapnia: Secondary | ICD-10-CM | POA: Diagnosis not present

## 2018-12-19 DIAGNOSIS — I82403 Acute embolism and thrombosis of unspecified deep veins of lower extremity, bilateral: Secondary | ICD-10-CM | POA: Diagnosis not present

## 2018-12-20 DIAGNOSIS — I2699 Other pulmonary embolism without acute cor pulmonale: Secondary | ICD-10-CM | POA: Diagnosis not present

## 2018-12-20 DIAGNOSIS — I82403 Acute embolism and thrombosis of unspecified deep veins of lower extremity, bilateral: Secondary | ICD-10-CM | POA: Diagnosis not present

## 2018-12-20 DIAGNOSIS — I69351 Hemiplegia and hemiparesis following cerebral infarction affecting right dominant side: Secondary | ICD-10-CM | POA: Diagnosis not present

## 2018-12-20 DIAGNOSIS — J9600 Acute respiratory failure, unspecified whether with hypoxia or hypercapnia: Secondary | ICD-10-CM | POA: Diagnosis not present

## 2018-12-20 DIAGNOSIS — F23 Brief psychotic disorder: Secondary | ICD-10-CM | POA: Diagnosis not present

## 2018-12-20 DIAGNOSIS — G9341 Metabolic encephalopathy: Secondary | ICD-10-CM | POA: Diagnosis not present

## 2018-12-23 ENCOUNTER — Telehealth: Payer: Self-pay | Admitting: *Deleted

## 2018-12-23 ENCOUNTER — Other Ambulatory Visit: Payer: Self-pay | Admitting: Oncology

## 2018-12-23 ENCOUNTER — Ambulatory Visit (INDEPENDENT_AMBULATORY_CARE_PROVIDER_SITE_OTHER): Payer: Medicare Other | Admitting: Internal Medicine

## 2018-12-23 ENCOUNTER — Encounter: Payer: Self-pay | Admitting: Internal Medicine

## 2018-12-23 ENCOUNTER — Other Ambulatory Visit: Payer: Self-pay

## 2018-12-23 ENCOUNTER — Ambulatory Visit (HOSPITAL_COMMUNITY)
Admission: RE | Admit: 2018-12-23 | Discharge: 2018-12-23 | Disposition: A | Discharge status: Home / Self Care | Payer: Medicare Other | Source: Ambulatory Visit | Attending: Internal Medicine | Admitting: Internal Medicine

## 2018-12-23 VITALS — BP 124/75 | HR 94 | Temp 98.1°F | Ht 62.0 in | Wt 142.4 lb

## 2018-12-23 DIAGNOSIS — J45909 Unspecified asthma, uncomplicated: Secondary | ICD-10-CM

## 2018-12-23 DIAGNOSIS — D72829 Elevated white blood cell count, unspecified: Secondary | ICD-10-CM | POA: Diagnosis not present

## 2018-12-23 DIAGNOSIS — Z8673 Personal history of transient ischemic attack (TIA), and cerebral infarction without residual deficits: Secondary | ICD-10-CM

## 2018-12-23 DIAGNOSIS — Z7901 Long term (current) use of anticoagulants: Secondary | ICD-10-CM | POA: Diagnosis not present

## 2018-12-23 DIAGNOSIS — M1711 Unilateral primary osteoarthritis, right knee: Secondary | ICD-10-CM

## 2018-12-23 DIAGNOSIS — Z86711 Personal history of pulmonary embolism: Secondary | ICD-10-CM

## 2018-12-23 DIAGNOSIS — R413 Other amnesia: Secondary | ICD-10-CM

## 2018-12-23 DIAGNOSIS — M25561 Pain in right knee: Secondary | ICD-10-CM

## 2018-12-23 DIAGNOSIS — G8929 Other chronic pain: Secondary | ICD-10-CM

## 2018-12-23 DIAGNOSIS — Z79899 Other long term (current) drug therapy: Secondary | ICD-10-CM | POA: Diagnosis not present

## 2018-12-23 DIAGNOSIS — Z86718 Personal history of other venous thrombosis and embolism: Secondary | ICD-10-CM

## 2018-12-23 DIAGNOSIS — I10 Essential (primary) hypertension: Secondary | ICD-10-CM | POA: Diagnosis not present

## 2018-12-23 DIAGNOSIS — J4520 Mild intermittent asthma, uncomplicated: Secondary | ICD-10-CM

## 2018-12-23 DIAGNOSIS — N179 Acute kidney failure, unspecified: Secondary | ICD-10-CM | POA: Diagnosis not present

## 2018-12-23 MED ORDER — AMLODIPINE BESYLATE 10 MG PO TABS: 10.0000 mg | ORAL_TABLET | Freq: Every day | ORAL | 2 refills | Status: DC

## 2018-12-23 MED ORDER — ACETAMINOPHEN 500 MG PO TABS: 500.0000 mg | ORAL_TABLET | ORAL | 0 refills | Status: DC | PRN

## 2018-12-23 MED ORDER — LISINOPRIL-HYDROCHLOROTHIAZIDE 20-25 MG PO TABS: 1.0000 | ORAL_TABLET | Freq: Every day | ORAL | 2 refills | Status: DC

## 2018-12-23 MED ORDER — ALBUTEROL SULFATE HFA 108 (90 BASE) MCG/ACT IN AERS: 2.0000 | INHALATION_SPRAY | Freq: Four times a day (QID) | RESPIRATORY_TRACT | 3 refills | Status: DC | PRN

## 2018-12-23 MED ORDER — SENNOSIDES-DOCUSATE SODIUM 8.6-50 MG PO TABS: 1.0000 | ORAL_TABLET | Freq: Every day | ORAL | 0 refills | Status: DC

## 2018-12-23 NOTE — Telephone Encounter (Signed)
Call to [facility] to schedule an appointment for patient for follow up.  Patient had been called x 2 by [facility]. Patient's phone is not working.  Message called to call the Clinics and to ask for [facility].  Return call appointment was scheduled for 01/25/2019 at 8:15 AM with a 7:45 AM arrival time.  Appointment information was given to Lorre Nick the patients CMA.  Angelina Ok, RN 12/23/2018 11:52 AM.

## 2018-12-23 NOTE — Assessment & Plan Note (Addendum)
BP at 124/75. Is on Lisinopril- HCTZ 20-25 mg QD and Amlodipine 10 mg QD -Continue home meds. (Refilled) -BMP today

## 2018-12-23 NOTE — Progress Notes (Signed)
   CC: Right knee pain  HPI:  [name] is a 63 y.o. female with PMHx listed below, came in due to right knee pain and hospital follow up for PE. Please see problem based charting for further details and assessment and plan.  Past Medical History:  Diagnosis Date  . Arthritis   . Asthma   . CVA (cerebral vascular accident) (HCC) 2008  . Hypertension   . Irregular heart beat 2015   Review of Systems:  Review of Systems  Constitutional: Negative for chills and fever.  Respiratory: Negative for cough and shortness of breath.   Cardiovascular: Negative for chest pain.  Gastrointestinal: Negative for abdominal pain.  Musculoskeletal: Positive for joint pain.  Psychiatric/Behavioral: Positive for memory loss. Negative for substance abuse and suicidal ideas.    Physical Exam:  Vitals:   12/23/18 0942  BP: 124/75  Pulse: 94  Temp: 98.1 F (36.7 C)  TempSrc: Oral  SpO2: 100%  Weight: 142 lb 6.4 oz (64.6 kg)  Height: 5\' 2"  (1.575 m)    Physical Exam Vitals signs and nursing note reviewed.  Constitutional:      Appearance: Normal appearance.  HENT:     Head: Normocephalic and atraumatic.  Cardiovascular:     Rate and Rhythm: Normal rate and regular rhythm.     Pulses: Normal pulses.     Heart sounds: Normal heart sounds. No murmur.  Pulmonary:     Effort: Pulmonary effort is normal. No respiratory distress.     Breath sounds: Normal breath sounds. No wheezing or rales.  Abdominal:     General: There is no distension.     Palpations: Abdomen is soft.     Tenderness: There is no abdominal tenderness.  Musculoskeletal:        General: Mild localized swelling at anterior aspect of right knee present.  No laxity, drawer test is negative mild crepitus, right knee range of motion is full but occasionally painful.  No swelling on the right lower extremity other than knee.     Right lower leg: No edema.  No tenderness palpation    Left lower leg: No edema.    Neurological:     Mental Status: She is alert. Mental status is at baseline.  Psychiatric:        Mood and Affect: Mood normal.        Behavior: Behavior normal.    Assessment & Plan:   See Encounters Tab for problem based charting.  Patient discussed with Dr. Heide Spark

## 2018-12-23 NOTE — Assessment & Plan Note (Addendum)
Ambulatory referral to neurology was sent when patient was discharging from hospital but Neurology office could not reach her.  We contacted the office and provided her Nurse aid's phone number to schedule. She is on Lipitor but not on ASA since she is on Xarelto due to recent DVT and PE.  -F/u with neurology -Continue Lipitor 20 mg QD

## 2018-12-23 NOTE — Patient Instructions (Addendum)
Thank you for allowing [place] to provide your care today. Today we discussed your recent hospital visit, knee pain and your medication. Please take your medication as before. (Continue taking Xarelto for at least 6 months. You can follow up with your primary care to decide about how long you should continue taking that) Please avoid Meloxicam, or Ibuprofen. I refilled the medications you asked for. And follow-up with psychiatrist and neurologist. You can take Tylenol and use Voltaren gel for your knee pain. I also order a knee X ray, I understand you do not want to do it today, you can do it if no improvement. Please return in clinic if your pain worsened. Today we did not make any changes to your medications.  Please follow-up with your primary care in a month or sooner as needed.   Should you have any questions or concerns please call the internal medicine clinic at [phone].    Thank you

## 2018-12-24 LAB — BMP8+ANION GAP
Anion Gap: 18 mmol/L (ref 10.0–18.0)
BUN / CREAT RATIO: 20 (ref 12–28)
BUN: 18 mg/dL (ref 8–27)
CALCIUM: 9.5 mg/dL (ref 8.7–10.3)
CO2: 19 mmol/L — ABNORMAL LOW (ref 20–29)
Chloride: 108 mmol/L — ABNORMAL HIGH (ref 96–106)
Creatinine, Ser: 0.91 mg/dL (ref 0.57–1.00)
GFR calc non Af Amer: 67 mL/min/{1.73_m2} (ref >59)
GFR, EST AFRICAN AMERICAN: 77 mL/min/{1.73_m2} (ref >59)
Glucose: 104 mg/dL — ABNORMAL HIGH (ref 65–99)
Potassium: 4 mmol/L (ref 3.5–5.2)
Sodium: 145 mmol/L — ABNORMAL HIGH (ref 134–144)

## 2018-12-24 LAB — CBC
HEMATOCRIT: 40.3 % (ref 34.0–46.6)
Hemoglobin: 12.7 g/dL (ref 11.1–15.9)
MCH: 27 pg (ref 26.6–33.0)
MCHC: 31.5 g/dL (ref 31.5–35.7)
MCV: 86 fL (ref 79–97)
Platelets: 418 10*3/uL (ref 150–450)
RBC: 4.7 x10E6/uL (ref 3.77–5.28)
RDW: 13.1 % (ref 11.7–15.4)
WBC: 8.4 10*3/uL (ref 3.4–10.8)

## 2018-12-26 ENCOUNTER — Telehealth: Payer: Self-pay

## 2018-12-26 NOTE — Assessment & Plan Note (Signed)
Refilled Albuterol today 

## 2018-12-26 NOTE — Assessment & Plan Note (Addendum)
Complains of worseinin of right knee pain. Reports some improvement with Voltaren gel. (Meloxicam has stopped before) On exam, has mild localized swelling at anterior aspect of rt knee present and has mild crepitus. Right knee range of motion is full but occasionally painful.  No laxity, drawer test is negative. No swelling on the right lower extremity other than knee.  -Ct Voltaren gel -Avoid Meloxicam or Ibuprofen (particularly when on Xarelto. Risk of bleeding explained to patient) -Tylenol PRN -Right knee XR

## 2018-12-26 NOTE — Telephone Encounter (Signed)
Stephanie Snyder with [facility] requesting PCS form to be completed for pt.

## 2018-12-27 ENCOUNTER — Other Ambulatory Visit: Payer: Self-pay | Admitting: *Deleted

## 2018-12-27 DIAGNOSIS — J9600 Acute respiratory failure, unspecified whether with hypoxia or hypercapnia: Secondary | ICD-10-CM | POA: Diagnosis not present

## 2018-12-27 DIAGNOSIS — I82403 Acute embolism and thrombosis of unspecified deep veins of lower extremity, bilateral: Secondary | ICD-10-CM | POA: Diagnosis not present

## 2018-12-27 DIAGNOSIS — F23 Brief psychotic disorder: Secondary | ICD-10-CM | POA: Diagnosis not present

## 2018-12-27 DIAGNOSIS — I2699 Other pulmonary embolism without acute cor pulmonale: Secondary | ICD-10-CM | POA: Diagnosis not present

## 2018-12-27 DIAGNOSIS — I69351 Hemiplegia and hemiparesis following cerebral infarction affecting right dominant side: Secondary | ICD-10-CM | POA: Diagnosis not present

## 2018-12-27 DIAGNOSIS — G9341 Metabolic encephalopathy: Secondary | ICD-10-CM | POA: Diagnosis not present

## 2018-12-27 NOTE — Telephone Encounter (Signed)
PCS form partially completed. Will give to Provider who saw patient on 12/23/2018 to finish form as patient has yet to meet new PCP. Kinnie Feil, RN, BSN

## 2018-12-28 NOTE — Telephone Encounter (Signed)
Completed and signed Request for Independent Assessment for Personal Care Services Attestation of Medical Need faxed to [facility] at [phone].  L. Rae Plotner, RN, BSN    

## 2018-12-28 NOTE — Progress Notes (Signed)
Internal Medicine Clinic Attending  Case discussed with Dr. Masoudi  at the time of the visit.  We reviewed the resident's history and exam and pertinent patient test results.  I agree with the assessment, diagnosis, and plan of care documented in the resident's note.  

## 2018-12-29 ENCOUNTER — Ambulatory Visit (HOSPITAL_COMMUNITY)
Admission: RE | Admit: 2018-12-29 | Discharge: 2018-12-29 | Disposition: A | Discharge status: Home / Self Care | Payer: Medicare Other | Source: Ambulatory Visit | Attending: Internal Medicine | Admitting: Internal Medicine

## 2018-12-29 DIAGNOSIS — M1711 Unilateral primary osteoarthritis, right knee: Secondary | ICD-10-CM | POA: Insufficient documentation

## 2018-12-29 MED ORDER — ATORVASTATIN CALCIUM 20 MG PO TABS: 20.0000 mg | ORAL_TABLET | Freq: Every day | ORAL | 0 refills | Status: DC

## 2019-01-01 ENCOUNTER — Encounter (HOSPITAL_COMMUNITY): Payer: Self-pay | Admitting: Emergency Medicine

## 2019-01-01 ENCOUNTER — Emergency Department (HOSPITAL_COMMUNITY)
Admission: EM | Admit: 2019-01-01 | Discharge: 2019-01-02 | Disposition: A | Discharge status: Home / Self Care | Payer: Medicare Other | Attending: Emergency Medicine | Admitting: Emergency Medicine

## 2019-01-01 ENCOUNTER — Other Ambulatory Visit: Payer: Self-pay

## 2019-01-01 DIAGNOSIS — M25562 Pain in left knee: Secondary | ICD-10-CM | POA: Insufficient documentation

## 2019-01-01 DIAGNOSIS — R410 Disorientation, unspecified: Secondary | ICD-10-CM | POA: Diagnosis not present

## 2019-01-01 DIAGNOSIS — Z79899 Other long term (current) drug therapy: Secondary | ICD-10-CM | POA: Diagnosis not present

## 2019-01-01 DIAGNOSIS — G8929 Other chronic pain: Secondary | ICD-10-CM | POA: Diagnosis not present

## 2019-01-01 DIAGNOSIS — R52 Pain, unspecified: Secondary | ICD-10-CM | POA: Diagnosis not present

## 2019-01-01 DIAGNOSIS — E876 Hypokalemia: Secondary | ICD-10-CM | POA: Diagnosis not present

## 2019-01-01 DIAGNOSIS — M25561 Pain in right knee: Secondary | ICD-10-CM | POA: Insufficient documentation

## 2019-01-01 DIAGNOSIS — I10 Essential (primary) hypertension: Secondary | ICD-10-CM | POA: Diagnosis not present

## 2019-01-01 DIAGNOSIS — R4182 Altered mental status, unspecified: Secondary | ICD-10-CM | POA: Diagnosis not present

## 2019-01-01 DIAGNOSIS — R000 Tachycardia, unspecified: Secondary | ICD-10-CM | POA: Diagnosis not present

## 2019-01-01 DIAGNOSIS — F29 Unspecified psychosis not due to a substance or known physiological condition: Secondary | ICD-10-CM | POA: Diagnosis not present

## 2019-01-01 LAB — CBC WITH DIFFERENTIAL/PLATELET
Abs Immature Granulocytes: 0.02 10*3/uL (ref 0.00–0.07)
Basophils Absolute: 0 10*3/uL (ref 0.0–0.1)
Basophils Relative: 0 %
Eosinophils Absolute: 0.1 10*3/uL (ref 0.0–0.5)
Eosinophils Relative: 1 %
HEMATOCRIT: 41 % (ref 36.0–46.0)
Hemoglobin: 13 g/dL (ref 12.0–15.0)
Immature Granulocytes: 0 %
Lymphocytes Relative: 18 %
Lymphs Abs: 1.7 10*3/uL (ref 0.7–4.0)
MCH: 27 pg (ref 26.0–34.0)
MCHC: 31.7 g/dL (ref 30.0–36.0)
MCV: 85.1 fL (ref 80.0–100.0)
MONOS PCT: 8 %
Monocytes Absolute: 0.8 10*3/uL (ref 0.1–1.0)
NEUTROS PCT: 73 %
Neutro Abs: 7 10*3/uL (ref 1.7–7.7)
Platelets: 281 10*3/uL (ref 150–400)
RBC: 4.82 MIL/uL (ref 3.87–5.11)
RDW: 13 % (ref 11.5–15.5)
WBC: 9.7 10*3/uL (ref 4.0–10.5)
nRBC: 0 % (ref 0.0–0.2)

## 2019-01-01 MED ORDER — ACETAMINOPHEN 500 MG PO TABS
1000.0000 mg | ORAL_TABLET | Freq: Once | ORAL | Status: AC
  Administered 2019-01-01: 1000 mg via ORAL
  Filled 2019-01-01: qty 2

## 2019-01-01 MED ORDER — DICLOFENAC SODIUM 1 % TD GEL
4.0000 g | Freq: Once | TRANSDERMAL | Status: DC
  Filled 2019-01-01: qty 100

## 2019-01-01 NOTE — ED Notes (Signed)
Pt states she ran out of her blood thinner 2 days ago

## 2019-01-01 NOTE — ED Provider Notes (Signed)
[facility] Provider Note   CSN: [id number] Arrival date & time: 01/01/19  2234     History   Chief Complaint Chief Complaint  Patient presents with  . Knee Pain    HPI Stephanie Snyder is a 63 y.o. female.  63 year old female with a history of CVA, submassive PE (on Xarelto) and DVT, HTN, tricompartment DJD of knee, acute psychosis (thought to be related to worsening dementia) presents to the emergency department by EMS.  She reportedly went to her neighbor's house complaining of bilateral knee pain.  Her neighbor felt that she was acting bizarre and called the ambulance for evaluation.  The patient states that she is having persistent pain in her bilateral knees aggravated with movement and ambulation.  She states that the pain is sharp and migrates between both of her knees.  She states that Voltaren will help her pain on occasion, but her pain was severe tonight.  She denies any recent trauma or fall since hospital discharge a few weeks ago.  No associated fevers, redness to the joint, numbness, CP, SOB.  She reports compliance with her daily medications, but ran out of her Xarelto a few days ago.  She states that this was not refilled by her primary care doctor during her recent outpatient visit.  The history is provided by the patient. No language interpreter was used.  Knee Pain    Past Medical History:  Diagnosis Date  . Arthritis   . Asthma   . CVA (cerebral vascular accident) (HCC) 2008  . Hypertension   . Irregular heart beat 2015    Patient Active Problem List   Diagnosis Date Noted  . Acute renal failure (ARF) (HCC)   . Hypokalemia   . Acute metabolic encephalopathy   . Acute respiratory failure (HCC)   . Cerebrovascular accident (CVA) due to thrombosis of right middle cerebral artery (HCC)   . Toxic encephalopathy 12/02/2018  . DVT, lower extremity, distal, acute, bilateral (HCC) 12/02/2018  . AKI (acute kidney injury)  (HCC) 12/02/2018  . Hypovolemic shock (HCC) 11/29/2018  . Acute psychosis (HCC)   . Screening for human immunodeficiency virus 03/02/2018  . Tricompartment degenerative joint disease of knee 06/22/2017  . Stress at home 01/04/2017  . Healthcare maintenance 11/20/2016  . Difficulty sleeping 11/20/2016  . Encounter for screening mammogram for breast cancer 05/15/2016  . Lumbar radiculopathy 05/15/2016  . Essential hypertension 02/14/2016  . Asthma 02/14/2016    Past Surgical History:  Procedure Laterality Date  . TUBAL LIGATION  1984     OB History   No obstetric history on file.      Home Medications    Prior to Admission medications   Medication Sig Start Date End Date Taking? Authorizing Provider  acetaminophen (TYLENOL) 500 MG tablet Take 1 tablet (500 mg total) by mouth every 4 (four) hours as needed. 12/23/18 12/23/19  Masoudi, Shawna OrleansElhamalsadat, MD  albuterol (PROVENTIL HFA;VENTOLIN HFA) 108 (90 Base) MCG/ACT inhaler Inhale 2 puffs into the lungs every 6 (six) hours as needed for wheezing or shortness of breath. 12/23/18   Masoudi, Elhamalsadat, MD  amLODipine (NORVASC) 10 MG tablet Take 1 tablet (10 mg total) by mouth daily. 12/23/18 01/23/19  Masoudi, Shawna OrleansElhamalsadat, MD  atorvastatin (LIPITOR) 20 MG tablet Take 1 tablet (20 mg total) by mouth daily at 6 PM. 12/29/18   Claudean SeveranceKrienke, Marissa M, MD  diclofenac sodium (VOLTAREN) 1 % GEL APPLY 2 GRAMS TOPICALLY 4 (FOUR) TIMES DAILY. AS NEEDED 12/23/18  Masoudi, Elhamalsadat, MD  lisinopril-hydrochlorothiazide (PRINZIDE,ZESTORETIC) 20-25 MG tablet Take 1 tablet by mouth daily. 12/23/18 01/22/20  Masoudi, Shawna Orleans, MD  polyethylene glycol (MIRALAX / GLYCOLAX) packet Take 17 g by mouth daily. 12/09/18   Albertine Grates, MD  potassium chloride (K-DUR) 10 MEQ tablet Take 2 tablets (20 mEq total) by mouth daily. 01/02/19   Antony Madura, PA-C  rivaroxaban (XARELTO) 20 MG TABS tablet Take 1 tablet (20 mg total) by mouth daily with supper. 01/02/19   Antony Madura,  PA-C  senna-docusate (SENOKOT-S) 8.6-50 MG tablet Take 1 tablet by mouth at bedtime. 12/23/18   MasoudiShawna Orleans, MD    Family History Family History  Problem Relation Age of Onset  . Cancer Father     Social History Social History   Tobacco Use  . Smoking status: Never Smoker  . Smokeless tobacco: Never Used  Substance Use Topics  . Alcohol use: No  . Drug use: No     Allergies   Latex   Review of Systems Review of Systems Ten systems reviewed and are negative for acute change, except as noted in the HPI.    Physical Exam Updated Vital Signs BP 138/74 (BP Location: Right Arm)   Pulse 66   Temp 98.2 F (36.8 C)   Resp 18   SpO2 100%   Physical Exam Vitals signs and nursing note reviewed.  Constitutional:      General: She is not in acute distress.    Appearance: She is well-developed. She is not diaphoretic.     Comments: Nontoxic appearing and in NAD. Mildly anxious.  HENT:     Head: Normocephalic and atraumatic.     Right Ear: External ear normal.     Left Ear: External ear normal.  Eyes:     General: No scleral icterus.    Conjunctiva/sclera: Conjunctivae normal.  Neck:     Musculoskeletal: Normal range of motion.  Cardiovascular:     Rate and Rhythm: Normal rate and regular rhythm.     Pulses: Normal pulses.     Comments: Intermittent mild tachycardia. Resting heart rate in the 90's. Pulmonary:     Effort: Pulmonary effort is normal. No respiratory distress.     Comments: Respirations even and unlabored Musculoskeletal: Normal range of motion.     Comments: Preserved ROM of bilateral knees. Pain with flexion bilaterally. No effusion, erythema, heat to touch. Healing abrasion overlying the L patella. No BLE edema; compartments soft.  Skin:    General: Skin is warm and dry.     Coloration: Skin is not pale.     Findings: No erythema or rash.  Neurological:     Mental Status: She is alert and oriented to person, place, and time.      Coordination: Coordination normal.     Comments: Sensation to light touch intact.  Psychiatric:        Behavior: Behavior normal.      ED Treatments / Results  Labs (all labs ordered are listed, but only abnormal results are displayed) Labs Reviewed  BASIC METABOLIC PANEL - Abnormal; Notable for the following components:      Result Value   Potassium 2.7 (*)    Glucose, Bld 147 (*)    All other components within normal limits  CBC WITH DIFFERENTIAL/PLATELET    EKG None  Radiology No results found.  Procedures Procedures (including critical care time)  Medications Ordered in ED Medications  acetaminophen (TYLENOL) tablet 1,000 mg (1,000 mg Oral Given 01/01/19 2321)  rivaroxaban (XARELTO) tablet 20 mg (20 mg Oral Given 01/02/19 0138)  potassium chloride SA (K-DUR,KLOR-CON) CR tablet 40 mEq (40 mEq Oral Given 01/02/19 0138)     Initial Impression / Assessment and Plan / ED Course  I have reviewed the triage vital signs and the nursing notes.  Pertinent labs & imaging results that were available during my care of the patient were reviewed by me and considered in my medical decision making (see chart for details).     Patient presents to the emergency department for evaluation of acute on chronic bilateral knee pain. Patient neurovascularly intact on exam. Denies any recent fall/trauma or injury. Was seen by PCP for same ~1 week ago with Xray of R knee showing arthritic changes. No swelling, erythema, heat to touch to the affected area; no concern for septic joint. Compartments in the affected extremity are soft. Plan for supportive management; primary care follow up as needed. Have encouraged continued use of Tylenol and topical Voltaren. She will also be given potassium x 5 days for episodic hypokalemia. Patient started back on Xarelto for management of DVT/PE as she ran out of this medication 2 days ago. Return precautions discussed and provided. Patient discharged in stable  condition with no unaddressed concerns.   Final Clinical Impressions(s) / ED Diagnoses   Final diagnoses:  Chronic pain of both knees  Hypokalemia    ED Discharge Orders         Ordered    rivaroxaban (XARELTO) 20 MG TABS tablet  Daily with supper     01/02/19 0237    potassium chloride (K-DUR) 10 MEQ tablet  Daily     01/02/19 0237           Antony Madura, PA-C 01/02/19 0545    Ward, Layla Maw, DO 01/02/19 [phone]

## 2019-01-01 NOTE — ED Notes (Signed)
ED Provider at bedside. 

## 2019-01-02 DIAGNOSIS — M25562 Pain in left knee: Secondary | ICD-10-CM | POA: Diagnosis not present

## 2019-01-02 LAB — BASIC METABOLIC PANEL
Anion gap: 14 (ref 5–15)
BUN: 8 mg/dL (ref 8–23)
CO2: 22 mmol/L (ref 22–32)
Calcium: 9.1 mg/dL (ref 8.9–10.3)
Chloride: 103 mmol/L (ref 98–111)
Creatinine, Ser: 0.9 mg/dL (ref 0.44–1.00)
GFR calc Af Amer: >60 mL/min (ref >60)
GFR calc non Af Amer: >60 mL/min (ref >60)
GLUCOSE: 147 mg/dL — AB (ref 70–99)
Potassium: 2.7 mmol/L — CL (ref 3.5–5.1)
Sodium: 139 mmol/L (ref 135–145)

## 2019-01-02 MED ORDER — POTASSIUM CHLORIDE CRYS ER 20 MEQ PO TBCR
40.0000 meq | EXTENDED_RELEASE_TABLET | Freq: Once | ORAL | Status: AC
  Administered 2019-01-02: 40 meq via ORAL
  Filled 2019-01-02: qty 2

## 2019-01-02 MED ORDER — RIVAROXABAN 20 MG PO TABS: 20.0000 mg | ORAL_TABLET | Freq: Every day | ORAL | 1 refills | Status: DC

## 2019-01-02 MED ORDER — POTASSIUM CHLORIDE ER 10 MEQ PO TBCR: 20.0000 meq | EXTENDED_RELEASE_TABLET | Freq: Every day | ORAL | 0 refills | Status: DC

## 2019-01-02 MED ORDER — RIVAROXABAN 20 MG PO TABS
20.0000 mg | ORAL_TABLET | ORAL | Status: AC
  Administered 2019-01-02: 20 mg via ORAL
  Filled 2019-01-02: qty 1

## 2019-01-02 NOTE — ED Notes (Signed)
Pt was ambulated in the hallway using her cane. Pt maintained steady gate.

## 2019-01-02 NOTE — ED Provider Notes (Signed)
Medical screening examination/treatment/procedure(s) were conducted as a shared visit with non-physician practitioner(s) and myself.  I personally evaluated the patient during the encounter.  None    Patient is a 63 year old female who presents to the emergency department with complaints of bilateral knee pain.  No history of injury.  On my evaluation, patient points to her dorsal feet as the cause of her pain.  Has known bilateral DVTs.  Off Xarelto for 2 days.  Restarted Xarelto in the ED.  Labs unremarkable other than potassium of 2.7.  Will replace.  No chest pain or shortness of breath.  No signs of cellulitis, gout, septic arthritis, compartment syndrome, fracture.  Neurovascularly intact distally.  Able to ambulate in the ED.   Kebrina Friend, Layla Maw, DO 01/02/19 [phone]

## 2019-01-02 NOTE — ED Notes (Signed)
Patient verbalizes understanding of discharge instructions. Opportunity for questioning and answers were provided. Armband removed by staff, pt discharged from ED ambulatory.   

## 2019-01-02 NOTE — ED Notes (Signed)
Pt did not want vitals again. This RN attempted to call pt husband from chart. There was no answer. I informed pt she can wait in lobby with blankets until we can call her husband again in the morning.

## 2019-01-02 NOTE — Discharge Instructions (Addendum)
Take your Xarelto as prescribed and follow-up with your primary care doctor.  Your potassium was low in the emergency department, therefore you should take potassium supplements as prescribed over the next 5 days.  Follow-up with your primary care doctor for further evaluation of your ongoing knee pain.  You may return for new or concerning symptoms.

## 2019-01-03 ENCOUNTER — Telehealth: Payer: Self-pay | Admitting: *Deleted

## 2019-01-03 DIAGNOSIS — J9600 Acute respiratory failure, unspecified whether with hypoxia or hypercapnia: Secondary | ICD-10-CM | POA: Diagnosis not present

## 2019-01-03 DIAGNOSIS — I69351 Hemiplegia and hemiparesis following cerebral infarction affecting right dominant side: Secondary | ICD-10-CM | POA: Diagnosis not present

## 2019-01-03 DIAGNOSIS — F23 Brief psychotic disorder: Secondary | ICD-10-CM | POA: Diagnosis not present

## 2019-01-03 DIAGNOSIS — I82403 Acute embolism and thrombosis of unspecified deep veins of lower extremity, bilateral: Secondary | ICD-10-CM | POA: Diagnosis not present

## 2019-01-03 DIAGNOSIS — G9341 Metabolic encephalopathy: Secondary | ICD-10-CM | POA: Diagnosis not present

## 2019-01-03 DIAGNOSIS — I2699 Other pulmonary embolism without acute cor pulmonale: Secondary | ICD-10-CM | POA: Diagnosis not present

## 2019-01-03 NOTE — Telephone Encounter (Signed)
[facility] OT calls to inform [facility] that pt was seen in ED on 2/2. OT will continue for pt

## 2019-01-08 DIAGNOSIS — I69351 Hemiplegia and hemiparesis following cerebral infarction affecting right dominant side: Secondary | ICD-10-CM | POA: Diagnosis not present

## 2019-01-08 DIAGNOSIS — I82403 Acute embolism and thrombosis of unspecified deep veins of lower extremity, bilateral: Secondary | ICD-10-CM | POA: Diagnosis not present

## 2019-01-08 DIAGNOSIS — I2699 Other pulmonary embolism without acute cor pulmonale: Secondary | ICD-10-CM | POA: Diagnosis not present

## 2019-01-08 DIAGNOSIS — Z9181 History of falling: Secondary | ICD-10-CM | POA: Diagnosis not present

## 2019-01-08 DIAGNOSIS — G9341 Metabolic encephalopathy: Secondary | ICD-10-CM | POA: Diagnosis not present

## 2019-01-08 DIAGNOSIS — Z7901 Long term (current) use of anticoagulants: Secondary | ICD-10-CM | POA: Diagnosis not present

## 2019-01-08 DIAGNOSIS — I499 Cardiac arrhythmia, unspecified: Secondary | ICD-10-CM | POA: Diagnosis not present

## 2019-01-08 DIAGNOSIS — J9600 Acute respiratory failure, unspecified whether with hypoxia or hypercapnia: Secondary | ICD-10-CM | POA: Diagnosis not present

## 2019-01-08 DIAGNOSIS — J45909 Unspecified asthma, uncomplicated: Secondary | ICD-10-CM | POA: Diagnosis not present

## 2019-01-08 DIAGNOSIS — F23 Brief psychotic disorder: Secondary | ICD-10-CM | POA: Diagnosis not present

## 2019-01-09 DIAGNOSIS — I82403 Acute embolism and thrombosis of unspecified deep veins of lower extremity, bilateral: Secondary | ICD-10-CM | POA: Diagnosis not present

## 2019-01-09 DIAGNOSIS — I69351 Hemiplegia and hemiparesis following cerebral infarction affecting right dominant side: Secondary | ICD-10-CM | POA: Diagnosis not present

## 2019-01-09 DIAGNOSIS — G9341 Metabolic encephalopathy: Secondary | ICD-10-CM | POA: Diagnosis not present

## 2019-01-09 DIAGNOSIS — I2699 Other pulmonary embolism without acute cor pulmonale: Secondary | ICD-10-CM | POA: Diagnosis not present

## 2019-01-09 DIAGNOSIS — F23 Brief psychotic disorder: Secondary | ICD-10-CM | POA: Diagnosis not present

## 2019-01-09 DIAGNOSIS — J9600 Acute respiratory failure, unspecified whether with hypoxia or hypercapnia: Secondary | ICD-10-CM | POA: Diagnosis not present

## 2019-01-17 DIAGNOSIS — J45909 Unspecified asthma, uncomplicated: Secondary | ICD-10-CM | POA: Diagnosis not present

## 2019-01-17 DIAGNOSIS — G8929 Other chronic pain: Secondary | ICD-10-CM | POA: Diagnosis not present

## 2019-01-17 DIAGNOSIS — S199XXA Unspecified injury of neck, initial encounter: Secondary | ICD-10-CM | POA: Diagnosis not present

## 2019-01-17 DIAGNOSIS — R4182 Altered mental status, unspecified: Secondary | ICD-10-CM | POA: Diagnosis not present

## 2019-01-17 DIAGNOSIS — E7220 Disorder of urea cycle metabolism, unspecified: Secondary | ICD-10-CM | POA: Diagnosis not present

## 2019-01-17 DIAGNOSIS — R072 Precordial pain: Secondary | ICD-10-CM | POA: Diagnosis not present

## 2019-01-17 DIAGNOSIS — N390 Urinary tract infection, site not specified: Secondary | ICD-10-CM | POA: Diagnosis not present

## 2019-01-17 DIAGNOSIS — M25562 Pain in left knee: Secondary | ICD-10-CM | POA: Diagnosis not present

## 2019-01-17 DIAGNOSIS — R451 Restlessness and agitation: Secondary | ICD-10-CM | POA: Diagnosis not present

## 2019-01-17 DIAGNOSIS — G9340 Encephalopathy, unspecified: Secondary | ICD-10-CM | POA: Diagnosis not present

## 2019-01-17 DIAGNOSIS — R918 Other nonspecific abnormal finding of lung field: Secondary | ICD-10-CM | POA: Diagnosis not present

## 2019-01-17 DIAGNOSIS — Z7409 Other reduced mobility: Secondary | ICD-10-CM | POA: Diagnosis not present

## 2019-01-17 DIAGNOSIS — R419 Unspecified symptoms and signs involving cognitive functions and awareness: Secondary | ICD-10-CM | POA: Diagnosis not present

## 2019-01-17 DIAGNOSIS — R911 Solitary pulmonary nodule: Secondary | ICD-10-CM | POA: Diagnosis not present

## 2019-01-17 DIAGNOSIS — S0990XA Unspecified injury of head, initial encounter: Secondary | ICD-10-CM | POA: Diagnosis not present

## 2019-01-17 DIAGNOSIS — R000 Tachycardia, unspecified: Secondary | ICD-10-CM | POA: Diagnosis not present

## 2019-01-17 DIAGNOSIS — I10 Essential (primary) hypertension: Secondary | ICD-10-CM | POA: Diagnosis not present

## 2019-01-17 DIAGNOSIS — R9431 Abnormal electrocardiogram [ECG] [EKG]: Secondary | ICD-10-CM | POA: Diagnosis not present

## 2019-01-17 DIAGNOSIS — R0602 Shortness of breath: Secondary | ICD-10-CM | POA: Diagnosis not present

## 2019-01-17 DIAGNOSIS — R002 Palpitations: Secondary | ICD-10-CM | POA: Diagnosis not present

## 2019-01-17 DIAGNOSIS — R079 Chest pain, unspecified: Secondary | ICD-10-CM | POA: Diagnosis not present

## 2019-01-18 DIAGNOSIS — Z7409 Other reduced mobility: Secondary | ICD-10-CM | POA: Diagnosis not present

## 2019-01-18 DIAGNOSIS — R000 Tachycardia, unspecified: Secondary | ICD-10-CM | POA: Diagnosis not present

## 2019-01-18 DIAGNOSIS — R4182 Altered mental status, unspecified: Secondary | ICD-10-CM | POA: Diagnosis not present

## 2019-01-18 DIAGNOSIS — R451 Restlessness and agitation: Secondary | ICD-10-CM | POA: Diagnosis not present

## 2019-01-18 DIAGNOSIS — S0990XA Unspecified injury of head, initial encounter: Secondary | ICD-10-CM | POA: Diagnosis not present

## 2019-01-18 DIAGNOSIS — S199XXA Unspecified injury of neck, initial encounter: Secondary | ICD-10-CM | POA: Diagnosis not present

## 2019-01-18 DIAGNOSIS — M25562 Pain in left knee: Secondary | ICD-10-CM | POA: Diagnosis not present

## 2019-01-18 DIAGNOSIS — R9431 Abnormal electrocardiogram [ECG] [EKG]: Secondary | ICD-10-CM | POA: Diagnosis not present

## 2019-01-18 DIAGNOSIS — R079 Chest pain, unspecified: Secondary | ICD-10-CM | POA: Diagnosis not present

## 2019-01-19 ENCOUNTER — Other Ambulatory Visit: Payer: Self-pay | Admitting: *Deleted

## 2019-01-19 DIAGNOSIS — M6281 Muscle weakness (generalized): Secondary | ICD-10-CM | POA: Diagnosis not present

## 2019-01-19 DIAGNOSIS — Z9119 Patient's noncompliance with other medical treatment and regimen: Secondary | ICD-10-CM | POA: Diagnosis not present

## 2019-01-19 DIAGNOSIS — R278 Other lack of coordination: Secondary | ICD-10-CM | POA: Diagnosis not present

## 2019-01-19 DIAGNOSIS — Z7982 Long term (current) use of aspirin: Secondary | ICD-10-CM | POA: Diagnosis not present

## 2019-01-19 DIAGNOSIS — N390 Urinary tract infection, site not specified: Secondary | ICD-10-CM | POA: Diagnosis not present

## 2019-01-19 DIAGNOSIS — Z9181 History of falling: Secondary | ICD-10-CM | POA: Diagnosis not present

## 2019-01-19 DIAGNOSIS — K766 Portal hypertension: Secondary | ICD-10-CM | POA: Diagnosis not present

## 2019-01-19 DIAGNOSIS — E7220 Disorder of urea cycle metabolism, unspecified: Secondary | ICD-10-CM | POA: Diagnosis present

## 2019-01-19 DIAGNOSIS — R279 Unspecified lack of coordination: Secondary | ICD-10-CM | POA: Diagnosis not present

## 2019-01-19 DIAGNOSIS — R2681 Unsteadiness on feet: Secondary | ICD-10-CM | POA: Diagnosis not present

## 2019-01-19 DIAGNOSIS — G8929 Other chronic pain: Secondary | ICD-10-CM | POA: Diagnosis not present

## 2019-01-19 DIAGNOSIS — I4891 Unspecified atrial fibrillation: Secondary | ICD-10-CM | POA: Diagnosis not present

## 2019-01-19 DIAGNOSIS — M1711 Unilateral primary osteoarthritis, right knee: Secondary | ICD-10-CM

## 2019-01-19 DIAGNOSIS — R000 Tachycardia, unspecified: Secondary | ICD-10-CM | POA: Diagnosis not present

## 2019-01-19 DIAGNOSIS — I10 Essential (primary) hypertension: Secondary | ICD-10-CM | POA: Diagnosis not present

## 2019-01-19 DIAGNOSIS — R419 Unspecified symptoms and signs involving cognitive functions and awareness: Secondary | ICD-10-CM | POA: Diagnosis not present

## 2019-01-19 DIAGNOSIS — R4689 Other symptoms and signs involving appearance and behavior: Secondary | ICD-10-CM | POA: Diagnosis not present

## 2019-01-19 DIAGNOSIS — R079 Chest pain, unspecified: Secondary | ICD-10-CM | POA: Diagnosis not present

## 2019-01-19 DIAGNOSIS — E7229 Other disorders of urea cycle metabolism: Secondary | ICD-10-CM | POA: Diagnosis not present

## 2019-01-19 DIAGNOSIS — R4182 Altered mental status, unspecified: Secondary | ICD-10-CM | POA: Diagnosis not present

## 2019-01-19 DIAGNOSIS — R462 Strange and inexplicable behavior: Secondary | ICD-10-CM | POA: Diagnosis not present

## 2019-01-19 DIAGNOSIS — Z743 Need for continuous supervision: Secondary | ICD-10-CM | POA: Diagnosis not present

## 2019-01-19 DIAGNOSIS — J45909 Unspecified asthma, uncomplicated: Secondary | ICD-10-CM | POA: Diagnosis not present

## 2019-01-19 DIAGNOSIS — Z9183 Wandering in diseases classified elsewhere: Secondary | ICD-10-CM | POA: Diagnosis not present

## 2019-01-19 DIAGNOSIS — F0390 Unspecified dementia without behavioral disturbance: Secondary | ICD-10-CM | POA: Diagnosis present

## 2019-01-19 DIAGNOSIS — R911 Solitary pulmonary nodule: Secondary | ICD-10-CM | POA: Diagnosis not present

## 2019-01-19 DIAGNOSIS — G4700 Insomnia, unspecified: Secondary | ICD-10-CM | POA: Diagnosis not present

## 2019-01-19 DIAGNOSIS — R748 Abnormal levels of other serum enzymes: Secondary | ICD-10-CM | POA: Diagnosis not present

## 2019-01-19 DIAGNOSIS — J984 Other disorders of lung: Secondary | ICD-10-CM | POA: Diagnosis not present

## 2019-01-19 DIAGNOSIS — G939 Disorder of brain, unspecified: Secondary | ICD-10-CM | POA: Diagnosis not present

## 2019-01-19 DIAGNOSIS — F419 Anxiety disorder, unspecified: Secondary | ICD-10-CM | POA: Diagnosis not present

## 2019-01-19 DIAGNOSIS — R918 Other nonspecific abnormal finding of lung field: Secondary | ICD-10-CM | POA: Diagnosis not present

## 2019-01-19 DIAGNOSIS — Z8673 Personal history of transient ischemic attack (TIA), and cerebral infarction without residual deficits: Secondary | ICD-10-CM | POA: Diagnosis not present

## 2019-01-19 DIAGNOSIS — E785 Hyperlipidemia, unspecified: Secondary | ICD-10-CM | POA: Diagnosis not present

## 2019-01-19 DIAGNOSIS — R002 Palpitations: Secondary | ICD-10-CM | POA: Diagnosis not present

## 2019-01-19 DIAGNOSIS — G9340 Encephalopathy, unspecified: Secondary | ICD-10-CM | POA: Diagnosis not present

## 2019-01-19 DIAGNOSIS — F329 Major depressive disorder, single episode, unspecified: Secondary | ICD-10-CM | POA: Diagnosis present

## 2019-01-19 DIAGNOSIS — R293 Abnormal posture: Secondary | ICD-10-CM | POA: Diagnosis not present

## 2019-01-19 DIAGNOSIS — J4520 Mild intermittent asthma, uncomplicated: Secondary | ICD-10-CM

## 2019-01-19 DIAGNOSIS — F918 Other conduct disorders: Secondary | ICD-10-CM | POA: Diagnosis not present

## 2019-01-19 DIAGNOSIS — J181 Lobar pneumonia, unspecified organism: Secondary | ICD-10-CM | POA: Diagnosis not present

## 2019-01-19 DIAGNOSIS — M25561 Pain in right knee: Secondary | ICD-10-CM | POA: Diagnosis not present

## 2019-01-19 DIAGNOSIS — M170 Bilateral primary osteoarthritis of knee: Secondary | ICD-10-CM | POA: Diagnosis not present

## 2019-01-19 DIAGNOSIS — Z9114 Patient's other noncompliance with medication regimen: Secondary | ICD-10-CM | POA: Diagnosis not present

## 2019-01-19 DIAGNOSIS — Z79899 Other long term (current) drug therapy: Secondary | ICD-10-CM | POA: Diagnosis not present

## 2019-01-19 DIAGNOSIS — M25562 Pain in left knee: Secondary | ICD-10-CM | POA: Diagnosis not present

## 2019-01-19 NOTE — Telephone Encounter (Signed)
Call from [facility] pharmacy - pt has changed pharmacy; please send new rxs on all meds. Thanks  Next appt scheduled 3/27 with PCP.

## 2019-01-23 MED ORDER — POLYETHYLENE GLYCOL 3350 17 G PO PACK: 17.0000 g | PACK | Freq: Every day | ORAL | 0 refills | Status: AC

## 2019-01-23 MED ORDER — RIVAROXABAN 20 MG PO TABS: 20.0000 mg | ORAL_TABLET | Freq: Every day | ORAL | 0 refills | Status: AC

## 2019-01-23 MED ORDER — SENNOSIDES-DOCUSATE SODIUM 8.6-50 MG PO TABS: 1.0000 | ORAL_TABLET | Freq: Every day | ORAL | 0 refills | Status: AC

## 2019-01-23 MED ORDER — ACETAMINOPHEN 500 MG PO TABS: 500.0000 mg | ORAL_TABLET | ORAL | 0 refills | Status: AC | PRN

## 2019-01-23 MED ORDER — POTASSIUM CHLORIDE ER 10 MEQ PO TBCR: 20.0000 meq | EXTENDED_RELEASE_TABLET | Freq: Every day | ORAL | 0 refills | Status: AC

## 2019-01-23 MED ORDER — ATORVASTATIN CALCIUM 20 MG PO TABS: 20.0000 mg | ORAL_TABLET | Freq: Every day | ORAL | 0 refills | Status: AC

## 2019-01-23 MED ORDER — ALBUTEROL SULFATE HFA 108 (90 BASE) MCG/ACT IN AERS: 2.0000 | INHALATION_SPRAY | Freq: Four times a day (QID) | RESPIRATORY_TRACT | 2 refills | Status: AC | PRN

## 2019-01-23 MED ORDER — DICLOFENAC SODIUM 1 % TD GEL: TRANSDERMAL | 1 refills | Status: AC

## 2019-01-23 MED ORDER — LISINOPRIL-HYDROCHLOROTHIAZIDE 20-25 MG PO TABS: 1.0000 | ORAL_TABLET | Freq: Every day | ORAL | 0 refills | Status: AC

## 2019-01-23 MED ORDER — AMLODIPINE BESYLATE 10 MG PO TABS: 10.0000 mg | ORAL_TABLET | Freq: Every day | ORAL | 2 refills | Status: AC

## 2019-01-25 ENCOUNTER — Other Ambulatory Visit: Payer: Self-pay

## 2019-01-25 ENCOUNTER — Encounter: Payer: Self-pay | Admitting: Adult Health

## 2019-01-25 ENCOUNTER — Inpatient Hospital Stay: Payer: Medicare Other | Admitting: Adult Health

## 2019-01-25 NOTE — Progress Notes (Deleted)
[facility] [address] [place] [address] [phone]       OFFICE FOLLOW UP NOTE  [name] Date of Birth:  1956/09/29 Medical Record Number:  [id number]   Reason for Referral:  hospital stroke follow up  CHIEF COMPLAINT:  No chief complaint on file.   HPI: Stephanie Snyder is being seen today for initial visit in the office for right GP small infarct secondary to small vessel disease on 12/04/2018. History obtained from *** and chart review. Reviewed all radiology images and labs personally.  Stephanie Snyder is a 63 y.o. female with history of HTN, arrhythmia admitted for syncope and hypotension.  She initially presented to ED on 11/21/2018 following syncopal episode in psych ED following discharge where she has spent 9 days in psych holding.  She is found to be hypotensive and was transferred to medical ED and was also found to be in significant renal failure. Found to have submassive PE with right heart strain. She was also found to have bilateral DVT. Now on Xarelto. EEG obtained due to acute confusional state/acute psychosis and was normal on 11/29/18. MRI obtained due to altered mental status and showed right GP small stroke. No tPA given due to outside window.    MRI head reviewed and showed right GP small infarct.  CTA head and neck showed right ICA siphon and bifurcation with mild atherosclerosis.  Lower extremity venous Doppler bilateral for DVT.  2D echo recently obtained on 11/30/2018 showing EF 65 to 70% with RV changes consistent with PE.  2D echo with bubble on 12/05/2018 did not show evidence of right-to-left shunt.  LDL 122 and A1c 6.2.  She was previously on Xarelto due to evidence of PE and recommended anticoagulation lifelong due to unprovoked DVT and prior diagnosis of A. fib.  Potential paradoxical emboli from PFO cannot be excluded along with potential atrial fibrillation (patient reported potential a flutter A. fib but no concrete  evidence in chart) but no further indication for work-up as it would not change management.  HTN stable during mission recommend long-term BP goal normotensive range.  Due to elevated LDL, atorvastatin 20 mg daily was initiated for HDL management.  Other stroke risk factors include advanced age but no prior history of stroke.  Therapies recommended discharge to SNF but family declined and was discharged home with home health therapies.    ROS:   14 system review of systems performed and negative with exception of ***  PMH:  Past Medical History:  Diagnosis Date  . Arthritis   . Asthma   . CVA (cerebral vascular accident) (HCC) 2008  . Hypertension   . Irregular heart beat 2015    PSH:  Past Surgical History:  Procedure Laterality Date  . TUBAL LIGATION  1984    Social History:  Social History   Socioeconomic History  . Marital status: Married    Spouse name: Not on file  . Number of children: Not on file  . Years of education: Not on file  . Highest education level: Not on file  Occupational History  . Not on file  Social Needs  . Financial resource strain: Not on file  . Food insecurity:    Worry: Not on file    Inability: Not on file  . Transportation needs:    Medical: Not on file    Non-medical: Not on file  Tobacco Use  . Smoking status: Never Smoker  . Smokeless tobacco: Never Used  Substance  and Sexual Activity  . Alcohol use: No  . Drug use: No  . Sexual activity: Not on file  Lifestyle  . Physical activity:    Days per week: Not on file    Minutes per session: Not on file  . Stress: Not on file  Relationships  . Social connections:    Talks on phone: Not on file    Gets together: Not on file    Attends religious service: Not on file    Active member of club or organization: Not on file    Attends meetings of clubs or organizations: Not on file    Relationship status: Not on file  . Intimate partner violence:    Fear of current or ex partner: Not  on file    Emotionally abused: Not on file    Physically abused: Not on file    Forced sexual activity: Not on file  Other Topics Concern  . Not on file  Social History Narrative  . Not on file    Family History:  Family History  Problem Relation Age of Onset  . Cancer Father     Medications:   Current Outpatient Medications on File Prior to Visit  Medication Sig Dispense Refill  . acetaminophen (TYLENOL) 500 MG tablet Take 1 tablet (500 mg total) by mouth every 4 (four) hours as needed. 30 tablet 0  . albuterol (PROVENTIL HFA;VENTOLIN HFA) 108 (90 Base) MCG/ACT inhaler Inhale 2 puffs into the lungs every 6 (six) hours as needed for wheezing or shortness of breath. 18 g 2  . amLODipine (NORVASC) 10 MG tablet Take 1 tablet (10 mg total) by mouth daily. 90 tablet 2  . atorvastatin (LIPITOR) 20 MG tablet Take 1 tablet (20 mg total) by mouth daily at 6 PM. 90 tablet 0  . diclofenac sodium (VOLTAREN) 1 % GEL APPLY 2 GRAMS TOPICALLY 4 (FOUR) TIMES DAILY. AS NEEDED 100 g 1  . lisinopril-hydrochlorothiazide (PRINZIDE,ZESTORETIC) 20-25 MG tablet Take 1 tablet by mouth daily. 90 tablet 0  . polyethylene glycol (MIRALAX / GLYCOLAX) packet Take 17 g by mouth daily. 14 each 0  . potassium chloride (K-DUR) 10 MEQ tablet Take 2 tablets (20 mEq total) by mouth daily. 10 tablet 0  . rivaroxaban (XARELTO) 20 MG TABS tablet Take 1 tablet (20 mg total) by mouth daily with supper. 30 tablet 0  . senna-docusate (SENOKOT-S) 8.6-50 MG tablet Take 1 tablet by mouth at bedtime. 30 tablet 0   No current facility-administered medications on file prior to visit.     Allergies:   Allergies  Allergen Reactions  . Latex Hives     Physical Exam  There were no vitals filed for this visit. There is no height or weight on file to calculate BMI. No exam data present  Depression screen [facility] 2/9 12/23/2018  Decreased Interest 0  Down, Depressed, Hopeless 1  PHQ - 2 Score 1  Altered sleeping 2  Tired,  decreased energy 0  Change in appetite 0  Feeling bad or failure about yourself  0  Trouble concentrating 3  Moving slowly or fidgety/restless 2  Suicidal thoughts 0  PHQ-9 Score 8  Difficult doing work/chores Very difficult     General: well developed, well nourished, seated, in no evident distress Head: head normocephalic and atraumatic.   Neck: supple with no carotid or supraclavicular bruits Cardiovascular: regular rate and rhythm, no murmurs Musculoskeletal: no deformity Skin:  no rash/petichiae Vascular:  Normal pulses all extremities  Neurologic Exam  Mental Status: Awake and fully alert. Oriented to place and time. Recent and remote memory intact. Attention span, concentration and fund of knowledge appropriate. Mood and affect appropriate.  Cranial Nerves: Fundoscopic exam reveals sharp disc margins. Pupils equal, briskly reactive to light. Extraocular movements full without nystagmus. Visual fields full to confrontation. Hearing intact. Facial sensation intact. Face, tongue, palate moves normally and symmetrically.  Motor: Normal bulk and tone. Normal strength in all tested extremity muscles. Sensory.: intact to touch , pinprick , position and vibratory sensation.  Coordination: Rapid alternating movements normal in all extremities. Finger-to-nose and heel-to-shin performed accurately bilaterally. Gait and Station: Arises from chair without difficulty. Stance is normal. Gait demonstrates normal stride length and balance. Able to heel, toe and tandem walk without difficulty.  Reflexes: 1+ and symmetric. Toes downgoing.    NIHSS  *** Modified Rankin  *** CHA2DS2-VASc *** HAS-BLED ***   Diagnostic Data (Labs, Imaging, Testing)  CT HEAD WO CONTRAST 12/01/2018 IMPRESSION: Mild chronic ischemic white matter disease. No acute intracranial abnormality seen.  CT ANGIO HEAD W OR WO CONTRAST CT ANGIO NECK W OR WO CONTRAST 12/05/2018 IMPRESSION: CT HEAD IMPRESSION 1. Stable  head CT with no acute intracranial abnormality identified. Recently identified small right basal ganglia infarct not well visualized by CT. 2. No other new acute intracranial abnormality. CTA HEAD AND NECK IMPRESSION 1. Negative CTA for large vessel occlusion. 2. Atheromatous irregularity throughout the carotid siphons with short-segment moderate to advanced cavernous right ICA stenosis. 3. Atheromatous plaque about the right carotid bifurcation with no more than mild 30% stenosis. 4. Additional mild multifocal atheromatous irregularity throughout the intracranial circulation. No other high-grade or correctable stenosis identified.  MR BRAIN WO CONTRAST 12/04/2018 IMPRESSION: 1. 5 mm focus of reduced diffusion within the right globus pallidus probably representing an acute/early subacute lacunar infarction. No associated hemorrhage or mass effect. 2. Mild chronic microvascular ischemic changes and volume loss of the brain.  ECHOCARDIOGRAM 11/30/2018 Study Conclusions - Left ventricle: The cavity size was normal. Wall thickness was   normal. Systolic function was vigorous. The estimated ejection   fraction was in the range of 65% to 70%. Wall motion was normal;   there were no regional wall motion abnormalities. Doppler   parameters are consistent with abnormal left ventricular   relaxation (grade 1 diastolic dysfunction). - Ventricular septum: The contour showed diastolic flattening. - Right ventricle: The cavity size was moderately dilated. Wall   thickness was normal. Systolic function was moderately reduced. - Right atrium: The atrium was moderately dilated. - Tricuspid valve: There was mild regurgitation. - Pulmonary arteries: Systolic pressure was mildly increased. PA   peak pressure: 43 mm Hg (S).  VAS US LOWER EXTREMITY VENOUS BILATERAL 11/30/2018 Summary: Right: Findings consistent with acute deep vein thrombosis involving the right peroneal vein, and right posterior  tibial vein. No cystic structure found in the popliteal fossa. Left: Findings consistent with acute deep vein thrombosis involving the left posterior tibial vein, and left peroneal vein. No cystic structure found in the popliteal fossa.     ASSESSMENT: Stephanie Snyder is a 63 y.o. year old female here with right GP infarct on 12/04/2018 secondary to small vessel disease. Vascular risk factors include HTN, HLD, acute PE and possible A. fib/a flutter.     PLAN:  1. Right GP infarct: Continue Xarelto (rivaroxaban) daily  and atorvastatin 20 mg for secondary stroke prevention. Maintain strict control of hypertension with blood pressure goal below 130/90, diabetes with hemoglobin A1c goal below 6.5%  and cholesterol with LDL cholesterol (bad cholesterol) goal below 70 mg/dL.  I also advised the patient to eat a healthy diet with plenty of whole grains, cereals, fruits and vegetables, exercise regularly with at least 30 minutes of continuous activity daily and maintain ideal body weight. 2. HTN: Advised to continue current treatment regimen.  Today's BP ***.  Advised to continue to monitor at home along with continued follow-up with PCP for management 3. HLD: Advised to continue current treatment regimen along with continued follow-up with PCP for future prescribing and monitoring of lipid panel 4. PE/DVT: Continue Xarelto lifelong for secondary stroke prevention 5. Possible AF/a flutter: Reported history by patient but was not started on medication management.  No indication for further work-up as it would not change treatment as it is already recommended anticoagulation lifelong for PE and DVT    Follow up in *** or call earlier if needed   Greater than 50% of time during this 25 minute visit was spent on counseling, explanation of diagnosis of right GP infarct, reviewing risk factor management of ***, planning of further management along with potential future management, and discussion with patient  and family answering all questions.    George Hugh, AGNP-BC  [facility] [address] [place], [place] [address]  Phone [phone] Fax [phone] Note: This document was prepared with digital dictation and possible smart phrase technology. Any transcriptional errors that result from this process are unintentional.

## 2019-01-25 NOTE — Telephone Encounter (Signed)
Returned TC to [facility], no agent available.  This RN selected option for return phone call. SChaplin, RN,BSN

## 2019-01-25 NOTE — Telephone Encounter (Signed)
Stephanie Snyder with [facility] requesting to speak with a nurse about meds. Pleases call back.

## 2019-02-16 DIAGNOSIS — M170 Bilateral primary osteoarthritis of knee: Secondary | ICD-10-CM | POA: Diagnosis not present

## 2019-02-16 DIAGNOSIS — F4310 Post-traumatic stress disorder, unspecified: Secondary | ICD-10-CM | POA: Diagnosis not present

## 2019-02-16 DIAGNOSIS — G4700 Insomnia, unspecified: Secondary | ICD-10-CM | POA: Diagnosis not present

## 2019-02-16 DIAGNOSIS — R4182 Altered mental status, unspecified: Secondary | ICD-10-CM | POA: Diagnosis not present

## 2019-02-16 DIAGNOSIS — R2681 Unsteadiness on feet: Secondary | ICD-10-CM | POA: Diagnosis not present

## 2019-02-16 DIAGNOSIS — R278 Other lack of coordination: Secondary | ICD-10-CM | POA: Diagnosis not present

## 2019-02-16 DIAGNOSIS — G9340 Encephalopathy, unspecified: Secondary | ICD-10-CM | POA: Diagnosis not present

## 2019-02-16 DIAGNOSIS — M6281 Muscle weakness (generalized): Secondary | ICD-10-CM | POA: Diagnosis not present

## 2019-02-16 DIAGNOSIS — R600 Localized edema: Secondary | ICD-10-CM | POA: Diagnosis not present

## 2019-02-16 DIAGNOSIS — N390 Urinary tract infection, site not specified: Secondary | ICD-10-CM | POA: Diagnosis not present

## 2019-02-16 DIAGNOSIS — R279 Unspecified lack of coordination: Secondary | ICD-10-CM | POA: Diagnosis not present

## 2019-02-16 DIAGNOSIS — R293 Abnormal posture: Secondary | ICD-10-CM | POA: Diagnosis not present

## 2019-02-16 DIAGNOSIS — J45909 Unspecified asthma, uncomplicated: Secondary | ICD-10-CM | POA: Diagnosis not present

## 2019-02-16 DIAGNOSIS — R419 Unspecified symptoms and signs involving cognitive functions and awareness: Secondary | ICD-10-CM | POA: Diagnosis not present

## 2019-02-16 DIAGNOSIS — F419 Anxiety disorder, unspecified: Secondary | ICD-10-CM | POA: Diagnosis not present

## 2019-02-16 DIAGNOSIS — I10 Essential (primary) hypertension: Secondary | ICD-10-CM | POA: Diagnosis not present

## 2019-02-16 DIAGNOSIS — E785 Hyperlipidemia, unspecified: Secondary | ICD-10-CM | POA: Diagnosis not present

## 2019-02-16 DIAGNOSIS — R911 Solitary pulmonary nodule: Secondary | ICD-10-CM | POA: Diagnosis not present

## 2019-02-16 DIAGNOSIS — Z743 Need for continuous supervision: Secondary | ICD-10-CM | POA: Diagnosis not present

## 2019-02-16 DIAGNOSIS — I4891 Unspecified atrial fibrillation: Secondary | ICD-10-CM | POA: Diagnosis not present

## 2019-02-16 DIAGNOSIS — G4709 Other insomnia: Secondary | ICD-10-CM | POA: Diagnosis not present

## 2019-02-21 DIAGNOSIS — J45909 Unspecified asthma, uncomplicated: Secondary | ICD-10-CM | POA: Diagnosis not present

## 2019-02-21 DIAGNOSIS — I10 Essential (primary) hypertension: Secondary | ICD-10-CM | POA: Diagnosis not present

## 2019-02-21 DIAGNOSIS — F419 Anxiety disorder, unspecified: Secondary | ICD-10-CM | POA: Diagnosis not present

## 2019-02-21 DIAGNOSIS — I4891 Unspecified atrial fibrillation: Secondary | ICD-10-CM | POA: Diagnosis not present

## 2019-02-24 ENCOUNTER — Encounter: Payer: Self-pay | Admitting: Internal Medicine

## 2019-02-24 DIAGNOSIS — E785 Hyperlipidemia, unspecified: Secondary | ICD-10-CM | POA: Diagnosis not present

## 2019-02-24 DIAGNOSIS — M170 Bilateral primary osteoarthritis of knee: Secondary | ICD-10-CM | POA: Diagnosis not present

## 2019-02-24 DIAGNOSIS — J45909 Unspecified asthma, uncomplicated: Secondary | ICD-10-CM | POA: Diagnosis not present

## 2019-02-24 DIAGNOSIS — I4891 Unspecified atrial fibrillation: Secondary | ICD-10-CM | POA: Diagnosis not present

## 2019-02-28 DIAGNOSIS — I10 Essential (primary) hypertension: Secondary | ICD-10-CM | POA: Diagnosis not present

## 2019-02-28 DIAGNOSIS — R600 Localized edema: Secondary | ICD-10-CM | POA: Diagnosis not present

## 2019-02-28 DIAGNOSIS — I4891 Unspecified atrial fibrillation: Secondary | ICD-10-CM | POA: Diagnosis not present

## 2019-03-03 DIAGNOSIS — J45909 Unspecified asthma, uncomplicated: Secondary | ICD-10-CM | POA: Diagnosis not present

## 2019-03-03 DIAGNOSIS — E785 Hyperlipidemia, unspecified: Secondary | ICD-10-CM | POA: Diagnosis not present

## 2019-03-03 DIAGNOSIS — R600 Localized edema: Secondary | ICD-10-CM | POA: Diagnosis not present

## 2019-03-03 DIAGNOSIS — M170 Bilateral primary osteoarthritis of knee: Secondary | ICD-10-CM | POA: Diagnosis not present

## 2019-03-13 DIAGNOSIS — G4709 Other insomnia: Secondary | ICD-10-CM | POA: Diagnosis not present

## 2019-03-13 DIAGNOSIS — G9340 Encephalopathy, unspecified: Secondary | ICD-10-CM | POA: Diagnosis not present

## 2019-03-13 DIAGNOSIS — F419 Anxiety disorder, unspecified: Secondary | ICD-10-CM | POA: Diagnosis not present

## 2019-03-17 DIAGNOSIS — G9340 Encephalopathy, unspecified: Secondary | ICD-10-CM | POA: Diagnosis not present

## 2019-03-17 DIAGNOSIS — F419 Anxiety disorder, unspecified: Secondary | ICD-10-CM | POA: Diagnosis not present

## 2019-03-17 DIAGNOSIS — I10 Essential (primary) hypertension: Secondary | ICD-10-CM | POA: Diagnosis not present

## 2019-03-17 DIAGNOSIS — G4709 Other insomnia: Secondary | ICD-10-CM | POA: Diagnosis not present

## 2019-03-21 DIAGNOSIS — F419 Anxiety disorder, unspecified: Secondary | ICD-10-CM | POA: Diagnosis not present

## 2019-03-21 DIAGNOSIS — I4891 Unspecified atrial fibrillation: Secondary | ICD-10-CM | POA: Diagnosis not present

## 2019-03-21 DIAGNOSIS — I10 Essential (primary) hypertension: Secondary | ICD-10-CM | POA: Diagnosis not present

## 2019-03-21 DIAGNOSIS — J45909 Unspecified asthma, uncomplicated: Secondary | ICD-10-CM | POA: Diagnosis not present

## 2019-03-24 DIAGNOSIS — I10 Essential (primary) hypertension: Secondary | ICD-10-CM | POA: Diagnosis not present

## 2019-03-24 DIAGNOSIS — R600 Localized edema: Secondary | ICD-10-CM | POA: Diagnosis not present

## 2019-03-24 DIAGNOSIS — I4891 Unspecified atrial fibrillation: Secondary | ICD-10-CM | POA: Diagnosis not present

## 2019-03-24 DIAGNOSIS — F419 Anxiety disorder, unspecified: Secondary | ICD-10-CM | POA: Diagnosis not present

## 2019-03-28 DIAGNOSIS — F419 Anxiety disorder, unspecified: Secondary | ICD-10-CM | POA: Diagnosis not present

## 2019-03-28 DIAGNOSIS — G4709 Other insomnia: Secondary | ICD-10-CM | POA: Diagnosis not present

## 2019-03-28 DIAGNOSIS — F4310 Post-traumatic stress disorder, unspecified: Secondary | ICD-10-CM | POA: Diagnosis not present

## 2019-04-07 DIAGNOSIS — I10 Essential (primary) hypertension: Secondary | ICD-10-CM | POA: Diagnosis not present

## 2019-04-07 DIAGNOSIS — R600 Localized edema: Secondary | ICD-10-CM | POA: Diagnosis not present

## 2019-04-07 DIAGNOSIS — I4891 Unspecified atrial fibrillation: Secondary | ICD-10-CM | POA: Diagnosis not present

## 2019-04-07 DIAGNOSIS — E785 Hyperlipidemia, unspecified: Secondary | ICD-10-CM | POA: Diagnosis not present

## 2019-04-08 DIAGNOSIS — I10 Essential (primary) hypertension: Secondary | ICD-10-CM | POA: Diagnosis not present

## 2019-04-08 DIAGNOSIS — E785 Hyperlipidemia, unspecified: Secondary | ICD-10-CM | POA: Diagnosis not present

## 2019-04-10 DIAGNOSIS — G9340 Encephalopathy, unspecified: Secondary | ICD-10-CM | POA: Diagnosis not present

## 2019-04-10 DIAGNOSIS — E785 Hyperlipidemia, unspecified: Secondary | ICD-10-CM | POA: Diagnosis not present

## 2019-04-10 DIAGNOSIS — I10 Essential (primary) hypertension: Secondary | ICD-10-CM | POA: Diagnosis not present

## 2019-04-11 DIAGNOSIS — I4891 Unspecified atrial fibrillation: Secondary | ICD-10-CM | POA: Diagnosis not present

## 2019-04-11 DIAGNOSIS — I10 Essential (primary) hypertension: Secondary | ICD-10-CM | POA: Diagnosis not present

## 2019-04-11 DIAGNOSIS — F419 Anxiety disorder, unspecified: Secondary | ICD-10-CM | POA: Diagnosis not present

## 2019-04-11 DIAGNOSIS — M170 Bilateral primary osteoarthritis of knee: Secondary | ICD-10-CM | POA: Diagnosis not present

## 2019-04-12 DIAGNOSIS — I10 Essential (primary) hypertension: Secondary | ICD-10-CM | POA: Diagnosis not present

## 2019-04-12 DIAGNOSIS — E785 Hyperlipidemia, unspecified: Secondary | ICD-10-CM | POA: Diagnosis not present

## 2019-04-13 ENCOUNTER — Encounter: Payer: Self-pay | Admitting: Internal Medicine

## 2019-04-13 DIAGNOSIS — I4891 Unspecified atrial fibrillation: Secondary | ICD-10-CM | POA: Diagnosis not present

## 2019-04-13 DIAGNOSIS — M170 Bilateral primary osteoarthritis of knee: Secondary | ICD-10-CM | POA: Diagnosis not present

## 2019-04-13 DIAGNOSIS — I10 Essential (primary) hypertension: Secondary | ICD-10-CM | POA: Diagnosis not present

## 2019-04-13 DIAGNOSIS — F419 Anxiety disorder, unspecified: Secondary | ICD-10-CM | POA: Diagnosis not present

## 2019-04-19 DIAGNOSIS — F419 Anxiety disorder, unspecified: Secondary | ICD-10-CM | POA: Diagnosis not present

## 2019-04-19 DIAGNOSIS — I10 Essential (primary) hypertension: Secondary | ICD-10-CM | POA: Diagnosis not present

## 2019-04-20 DIAGNOSIS — I10 Essential (primary) hypertension: Secondary | ICD-10-CM | POA: Diagnosis not present

## 2019-04-24 DIAGNOSIS — F4310 Post-traumatic stress disorder, unspecified: Secondary | ICD-10-CM | POA: Diagnosis not present

## 2019-04-24 DIAGNOSIS — G4709 Other insomnia: Secondary | ICD-10-CM | POA: Diagnosis not present

## 2019-04-24 DIAGNOSIS — F419 Anxiety disorder, unspecified: Secondary | ICD-10-CM | POA: Diagnosis not present

## 2019-04-26 DIAGNOSIS — I10 Essential (primary) hypertension: Secondary | ICD-10-CM | POA: Diagnosis not present

## 2019-04-29 DIAGNOSIS — Z20828 Contact with and (suspected) exposure to other viral communicable diseases: Secondary | ICD-10-CM | POA: Diagnosis not present

## 2019-05-02 DIAGNOSIS — M170 Bilateral primary osteoarthritis of knee: Secondary | ICD-10-CM | POA: Diagnosis not present

## 2019-05-02 DIAGNOSIS — I10 Essential (primary) hypertension: Secondary | ICD-10-CM | POA: Diagnosis not present

## 2019-05-05 DIAGNOSIS — R600 Localized edema: Secondary | ICD-10-CM | POA: Diagnosis not present

## 2019-05-05 DIAGNOSIS — I10 Essential (primary) hypertension: Secondary | ICD-10-CM | POA: Diagnosis not present

## 2019-05-08 DIAGNOSIS — F419 Anxiety disorder, unspecified: Secondary | ICD-10-CM | POA: Diagnosis not present

## 2019-05-08 DIAGNOSIS — F29 Unspecified psychosis not due to a substance or known physiological condition: Secondary | ICD-10-CM | POA: Diagnosis not present

## 2019-05-08 DIAGNOSIS — G4709 Other insomnia: Secondary | ICD-10-CM | POA: Diagnosis not present

## 2019-05-10 DIAGNOSIS — M170 Bilateral primary osteoarthritis of knee: Secondary | ICD-10-CM | POA: Diagnosis not present

## 2019-05-10 DIAGNOSIS — R400 Somnolence: Secondary | ICD-10-CM | POA: Diagnosis not present

## 2019-05-10 DIAGNOSIS — F419 Anxiety disorder, unspecified: Secondary | ICD-10-CM | POA: Diagnosis not present

## 2019-05-10 DIAGNOSIS — I10 Essential (primary) hypertension: Secondary | ICD-10-CM | POA: Diagnosis not present

## 2019-05-15 DIAGNOSIS — G4709 Other insomnia: Secondary | ICD-10-CM | POA: Diagnosis not present

## 2019-05-15 DIAGNOSIS — F419 Anxiety disorder, unspecified: Secondary | ICD-10-CM | POA: Diagnosis not present

## 2019-05-15 DIAGNOSIS — R454 Irritability and anger: Secondary | ICD-10-CM | POA: Diagnosis not present

## 2019-05-15 DIAGNOSIS — F29 Unspecified psychosis not due to a substance or known physiological condition: Secondary | ICD-10-CM | POA: Diagnosis not present

## 2019-05-24 DIAGNOSIS — I10 Essential (primary) hypertension: Secondary | ICD-10-CM | POA: Diagnosis not present

## 2019-05-24 DIAGNOSIS — M5432 Sciatica, left side: Secondary | ICD-10-CM | POA: Diagnosis not present

## 2019-05-24 DIAGNOSIS — F419 Anxiety disorder, unspecified: Secondary | ICD-10-CM | POA: Diagnosis not present

## 2019-05-25 DIAGNOSIS — E785 Hyperlipidemia, unspecified: Secondary | ICD-10-CM | POA: Diagnosis not present

## 2019-05-25 DIAGNOSIS — F329 Major depressive disorder, single episode, unspecified: Secondary | ICD-10-CM | POA: Diagnosis not present

## 2019-05-25 DIAGNOSIS — M170 Bilateral primary osteoarthritis of knee: Secondary | ICD-10-CM | POA: Diagnosis not present

## 2019-05-29 DIAGNOSIS — F29 Unspecified psychosis not due to a substance or known physiological condition: Secondary | ICD-10-CM | POA: Diagnosis not present

## 2019-05-29 DIAGNOSIS — F419 Anxiety disorder, unspecified: Secondary | ICD-10-CM | POA: Diagnosis not present

## 2019-05-29 DIAGNOSIS — G4709 Other insomnia: Secondary | ICD-10-CM | POA: Diagnosis not present

## 2019-05-31 DIAGNOSIS — M5432 Sciatica, left side: Secondary | ICD-10-CM | POA: Diagnosis not present

## 2019-05-31 DIAGNOSIS — M170 Bilateral primary osteoarthritis of knee: Secondary | ICD-10-CM | POA: Diagnosis not present

## 2019-05-31 DIAGNOSIS — R600 Localized edema: Secondary | ICD-10-CM | POA: Diagnosis not present

## 2019-06-21 DIAGNOSIS — R030 Elevated blood-pressure reading, without diagnosis of hypertension: Secondary | ICD-10-CM | POA: Diagnosis not present

## 2019-06-21 DIAGNOSIS — I10 Essential (primary) hypertension: Secondary | ICD-10-CM | POA: Diagnosis not present

## 2019-07-19 DIAGNOSIS — J45909 Unspecified asthma, uncomplicated: Secondary | ICD-10-CM | POA: Diagnosis not present

## 2019-07-19 DIAGNOSIS — I10 Essential (primary) hypertension: Secondary | ICD-10-CM | POA: Diagnosis not present

## 2019-07-19 DIAGNOSIS — F419 Anxiety disorder, unspecified: Secondary | ICD-10-CM | POA: Diagnosis not present

## 2019-07-19 DIAGNOSIS — I4891 Unspecified atrial fibrillation: Secondary | ICD-10-CM | POA: Diagnosis not present

## 2019-07-23 DIAGNOSIS — J3489 Other specified disorders of nose and nasal sinuses: Secondary | ICD-10-CM | POA: Diagnosis not present

## 2019-07-23 DIAGNOSIS — R030 Elevated blood-pressure reading, without diagnosis of hypertension: Secondary | ICD-10-CM | POA: Diagnosis not present

## 2019-07-24 DIAGNOSIS — F419 Anxiety disorder, unspecified: Secondary | ICD-10-CM | POA: Diagnosis not present

## 2019-07-24 DIAGNOSIS — G4709 Other insomnia: Secondary | ICD-10-CM | POA: Diagnosis not present

## 2019-07-24 DIAGNOSIS — F4310 Post-traumatic stress disorder, unspecified: Secondary | ICD-10-CM | POA: Diagnosis not present

## 2019-07-25 DIAGNOSIS — M170 Bilateral primary osteoarthritis of knee: Secondary | ICD-10-CM | POA: Diagnosis not present

## 2019-07-25 DIAGNOSIS — F29 Unspecified psychosis not due to a substance or known physiological condition: Secondary | ICD-10-CM | POA: Diagnosis not present

## 2019-07-25 DIAGNOSIS — I10 Essential (primary) hypertension: Secondary | ICD-10-CM | POA: Diagnosis not present

## 2019-07-31 DIAGNOSIS — E708 Other disorders of aromatic amino-acid metabolism: Secondary | ICD-10-CM | POA: Diagnosis not present

## 2019-07-31 DIAGNOSIS — R319 Hematuria, unspecified: Secondary | ICD-10-CM | POA: Diagnosis not present

## 2019-07-31 DIAGNOSIS — D649 Anemia, unspecified: Secondary | ICD-10-CM | POA: Diagnosis not present

## 2019-07-31 DIAGNOSIS — R05 Cough: Secondary | ICD-10-CM | POA: Diagnosis not present

## 2019-07-31 DIAGNOSIS — R4182 Altered mental status, unspecified: Secondary | ICD-10-CM | POA: Diagnosis not present

## 2019-08-01 DIAGNOSIS — R4182 Altered mental status, unspecified: Secondary | ICD-10-CM | POA: Diagnosis not present

## 2019-08-03 DIAGNOSIS — R4182 Altered mental status, unspecified: Secondary | ICD-10-CM | POA: Diagnosis not present

## 2019-08-03 DIAGNOSIS — G4709 Other insomnia: Secondary | ICD-10-CM | POA: Diagnosis not present

## 2019-08-03 DIAGNOSIS — R030 Elevated blood-pressure reading, without diagnosis of hypertension: Secondary | ICD-10-CM | POA: Diagnosis not present

## 2019-08-08 DIAGNOSIS — R030 Elevated blood-pressure reading, without diagnosis of hypertension: Secondary | ICD-10-CM | POA: Diagnosis not present

## 2019-08-08 DIAGNOSIS — I10 Essential (primary) hypertension: Secondary | ICD-10-CM | POA: Diagnosis not present

## 2019-08-14 DIAGNOSIS — F320 Major depressive disorder, single episode, mild: Secondary | ICD-10-CM | POA: Diagnosis not present

## 2019-08-21 DIAGNOSIS — F320 Major depressive disorder, single episode, mild: Secondary | ICD-10-CM | POA: Diagnosis not present

## 2019-08-22 DIAGNOSIS — R030 Elevated blood-pressure reading, without diagnosis of hypertension: Secondary | ICD-10-CM | POA: Diagnosis not present

## 2019-08-24 DIAGNOSIS — G9340 Encephalopathy, unspecified: Secondary | ICD-10-CM | POA: Diagnosis not present

## 2019-08-24 DIAGNOSIS — J45909 Unspecified asthma, uncomplicated: Secondary | ICD-10-CM | POA: Diagnosis not present

## 2019-08-24 DIAGNOSIS — I10 Essential (primary) hypertension: Secondary | ICD-10-CM | POA: Diagnosis not present

## 2019-08-28 DIAGNOSIS — M170 Bilateral primary osteoarthritis of knee: Secondary | ICD-10-CM | POA: Diagnosis not present

## 2019-09-04 DIAGNOSIS — F320 Major depressive disorder, single episode, mild: Secondary | ICD-10-CM | POA: Diagnosis not present

## 2019-09-12 DIAGNOSIS — L603 Nail dystrophy: Secondary | ICD-10-CM | POA: Diagnosis not present

## 2019-09-12 DIAGNOSIS — I739 Peripheral vascular disease, unspecified: Secondary | ICD-10-CM | POA: Diagnosis not present

## 2019-09-12 DIAGNOSIS — B351 Tinea unguium: Secondary | ICD-10-CM | POA: Diagnosis not present

## 2019-09-18 DIAGNOSIS — D519 Vitamin B12 deficiency anemia, unspecified: Secondary | ICD-10-CM | POA: Diagnosis not present

## 2019-09-18 DIAGNOSIS — D649 Anemia, unspecified: Secondary | ICD-10-CM | POA: Diagnosis not present

## 2019-09-18 DIAGNOSIS — E785 Hyperlipidemia, unspecified: Secondary | ICD-10-CM | POA: Diagnosis not present

## 2019-10-30 ENCOUNTER — Encounter: Payer: Self-pay | Admitting: Internal Medicine

## 2019-10-30 ENCOUNTER — Encounter: Payer: Medicare Other | Admitting: Internal Medicine

## 2020-05-20 ENCOUNTER — Encounter: Payer: Self-pay | Admitting: Internal Medicine

## 2020-11-17 IMAGING — CR DG CHEST 2V
2 series · 2 of 2 positions shown · non-contrast
Comparison: 07/27/2014.

CLINICAL DATA: Altered mental status.

EXAM:
CHEST - 2 VIEW

[w chest pa]
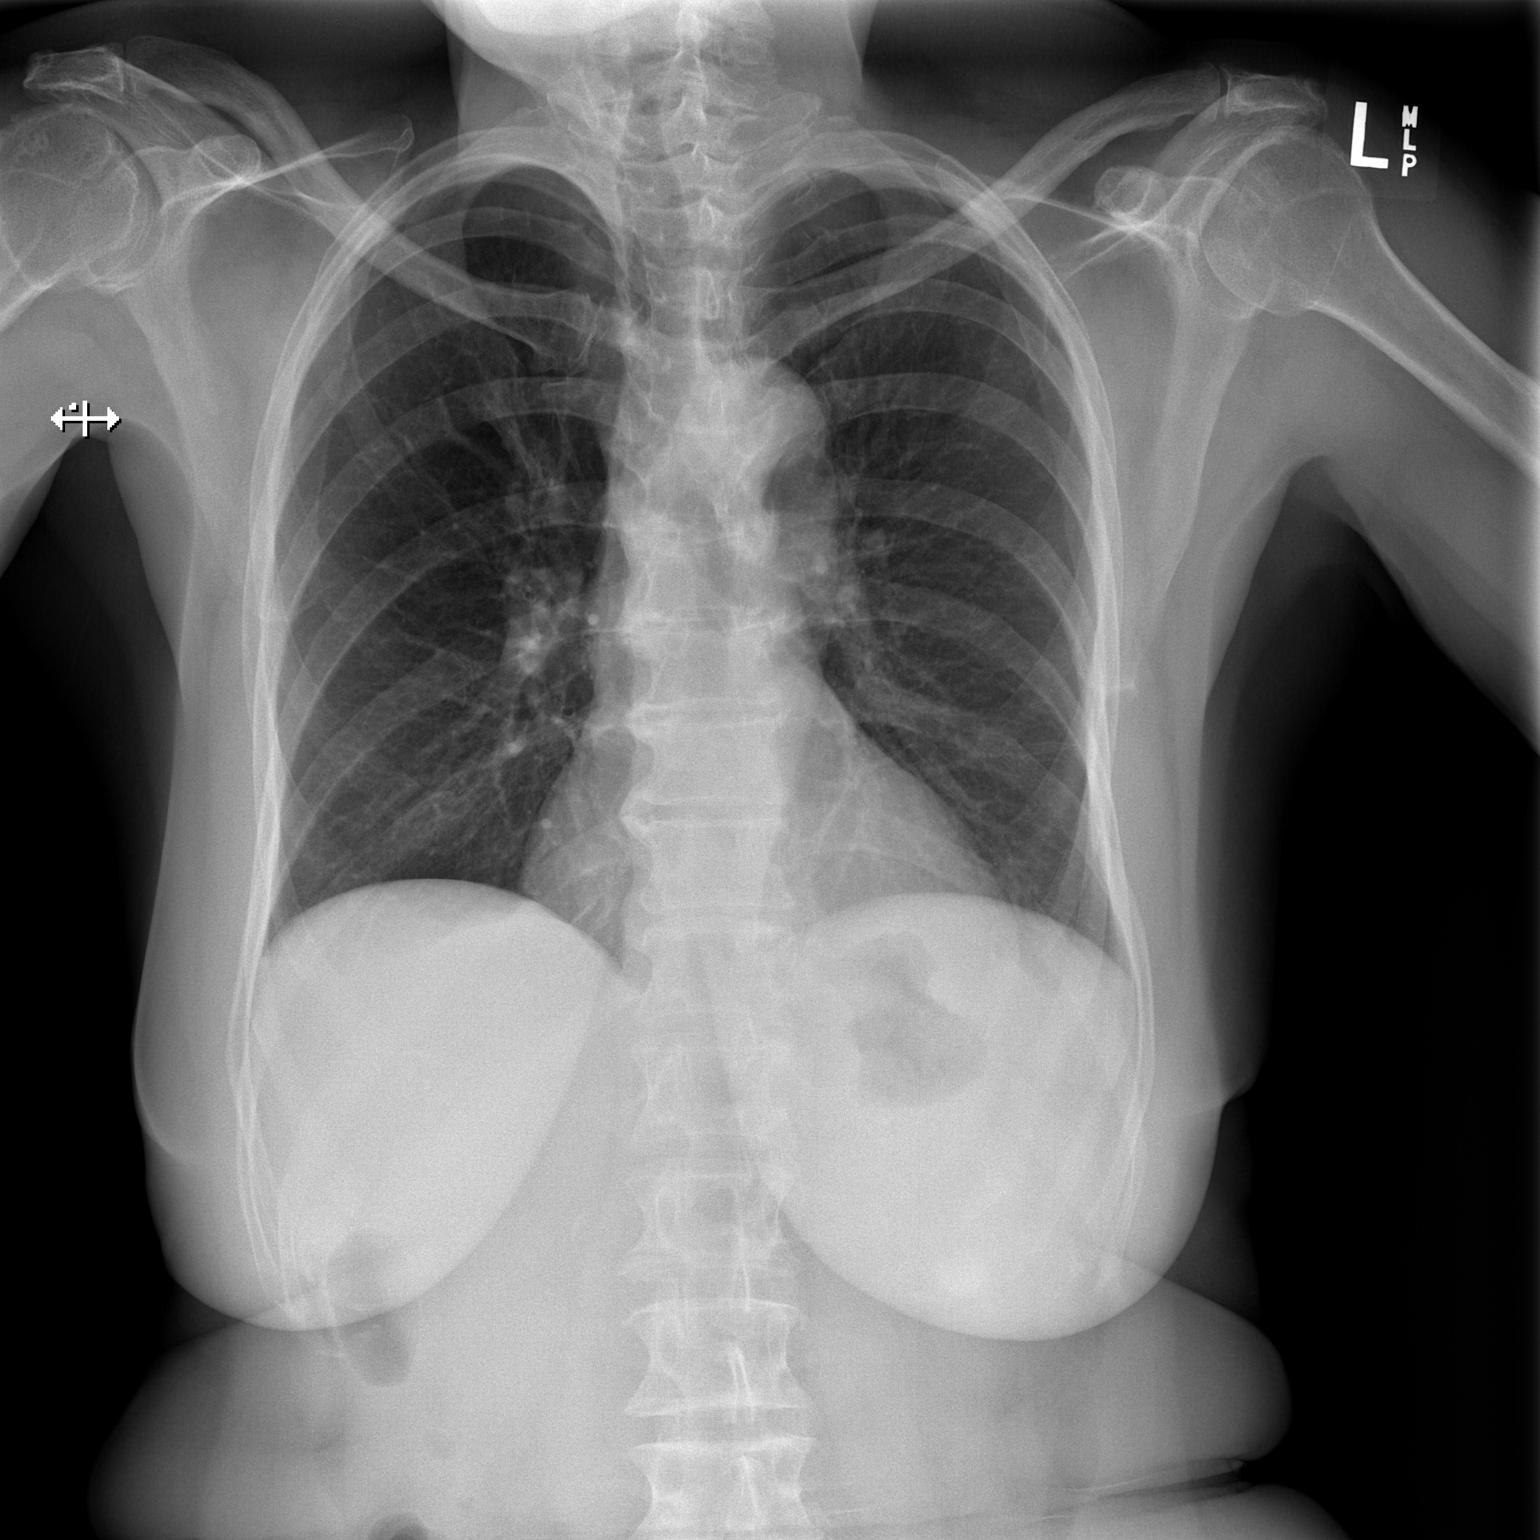

[w chest lat]
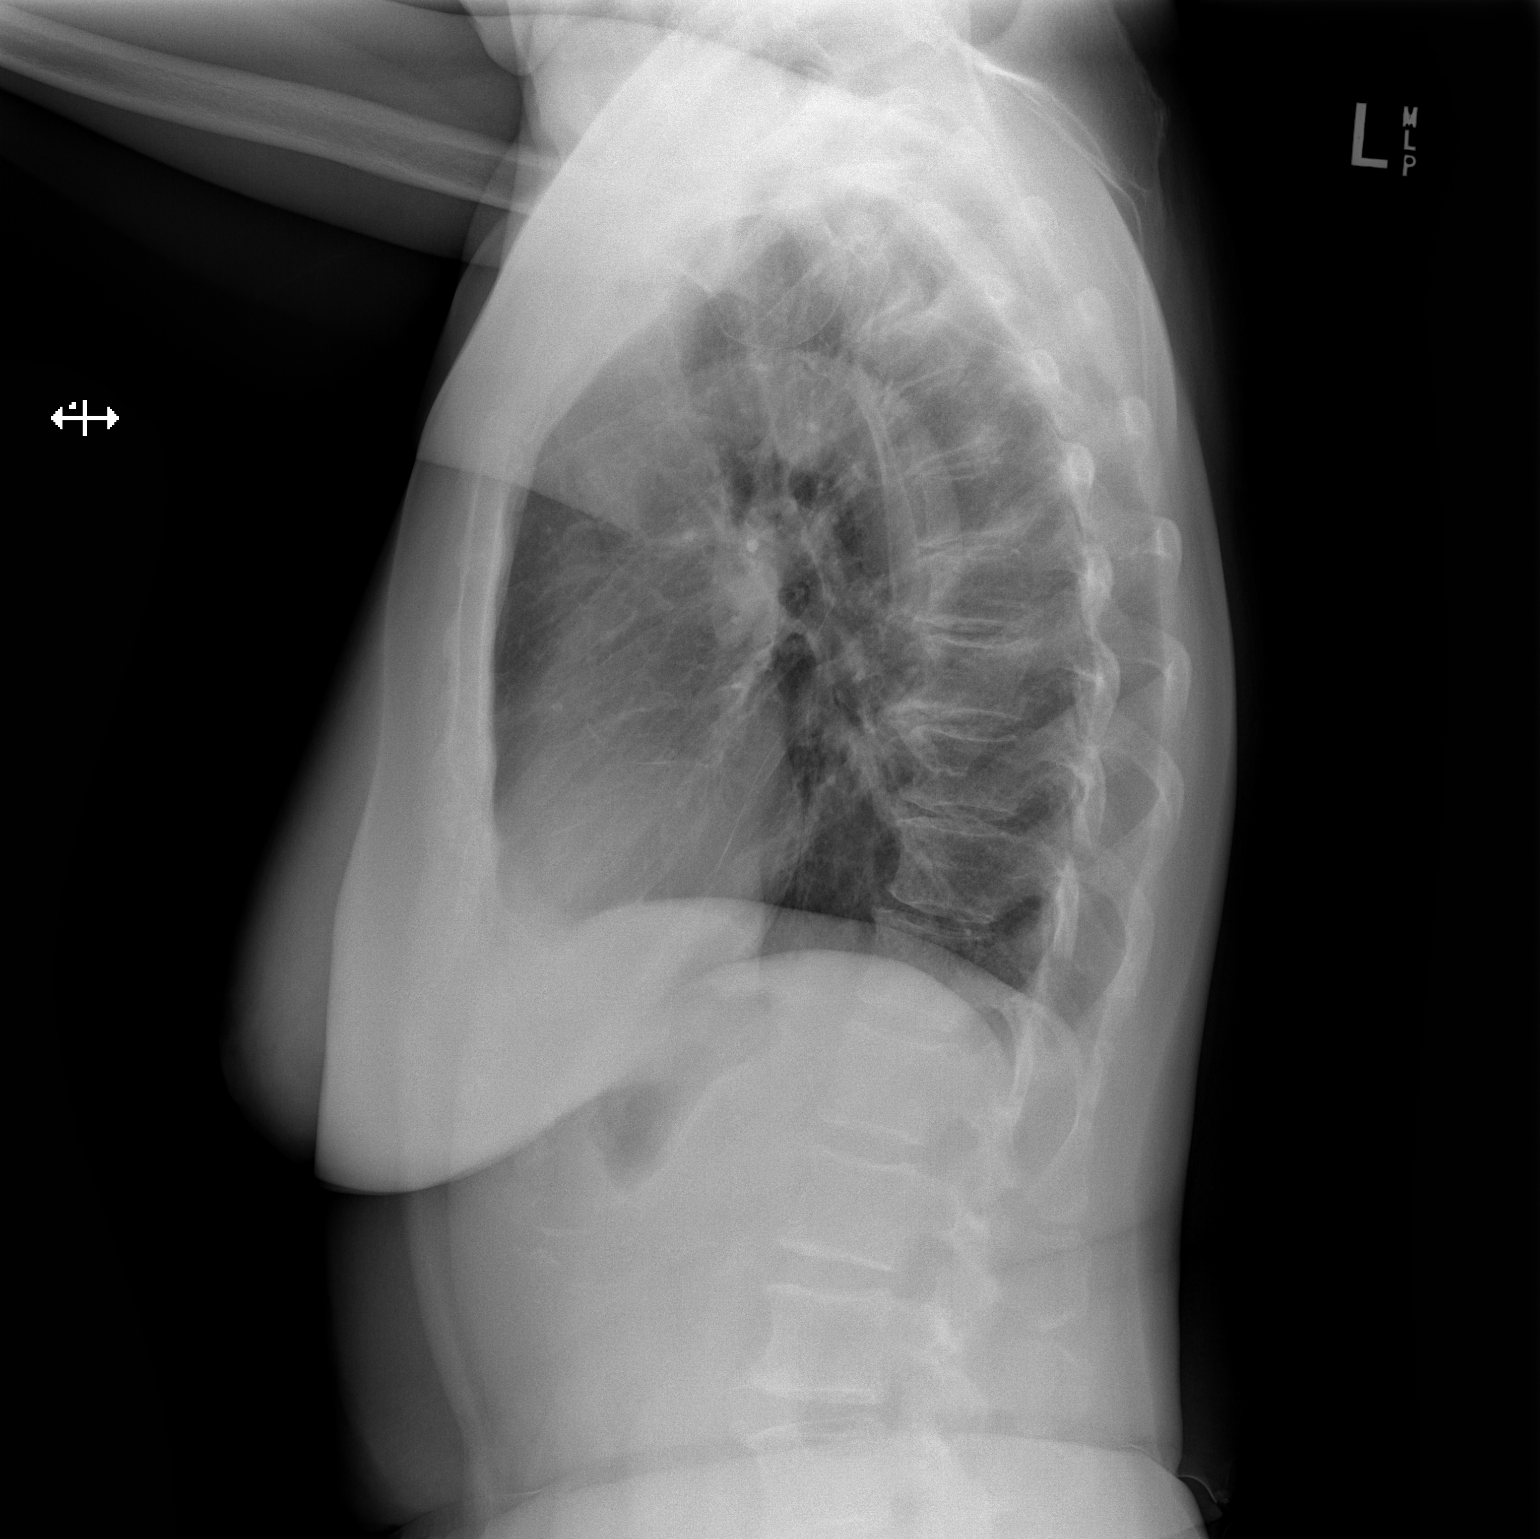

[2 of 2 positions shown; findings below may reference images not displayed]

FINDINGS: Normal sized heart. Clear lungs. Mild thoracic spine degenerative
changes.
IMPRESSION: No acute abnormality.

## 2020-11-29 IMAGING — MR MR HEAD W/O CM
10 series · 43 of 48 positions shown · non-contrast
Comparison: 12/01/2017 CT head.

CLINICAL DATA: 62 y/o  F; acute psychosis.

EXAM:
MRI HEAD WITHOUT CONTRAST
TECHNIQUE: Multiplanar, multiecho pulse sequences of the brain and surrounding
structures were obtained without intravenous contrast.

[Series 3: T1 · sagittal · 5.0mm · 0.47mm/px · 2 of 22 slices shown]
[im 1/22]
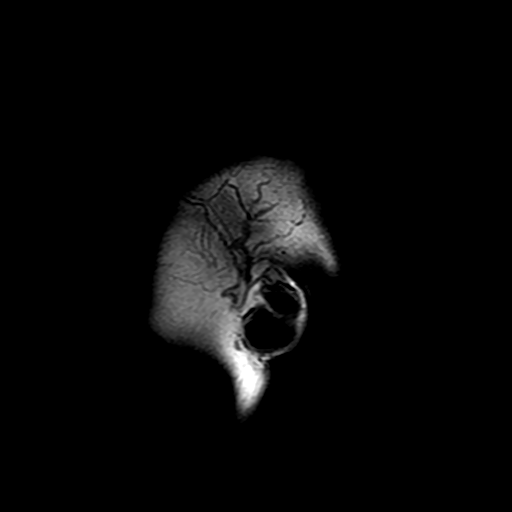
[im 22/22]
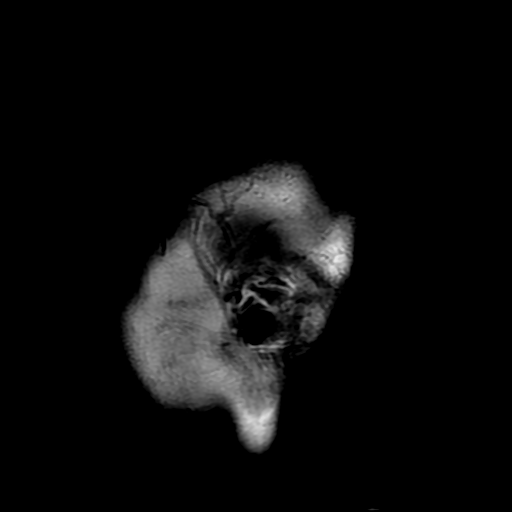

[Series 4: DWI · axial · 3.0mm · 1.09mm/px · z∈[-87,+53]mm · 8 of 96 slices shown (1 of 4)]
[im 1/96]
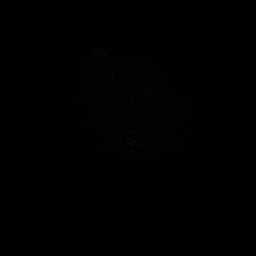
[im 11/96]
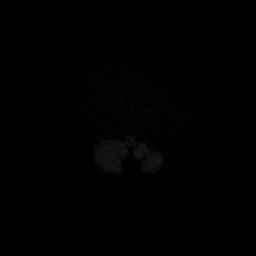
[im 32/96]
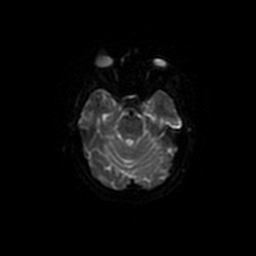
[im 43/96]
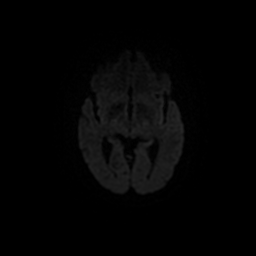
[im 53/96]
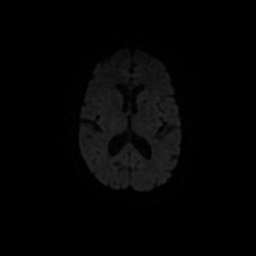
[im 64/96]
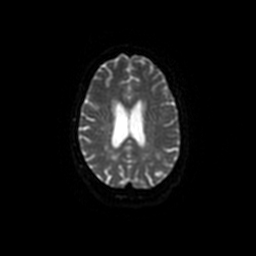
[im 85/96]
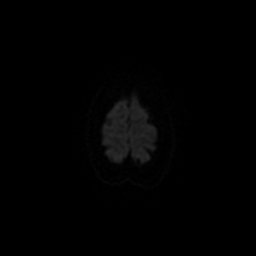
[im 96/96]
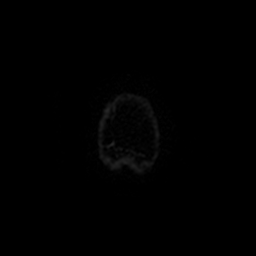

[Series 5: T2 · axial · 5.0mm · 0.86mm/px · z∈[-81,+56]mm · 3 of 24 slices shown (1 of 2)]
[im 1/24]
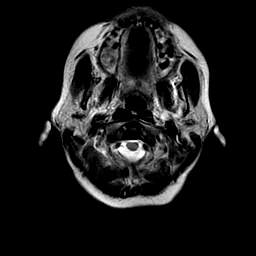
[im 12/24]
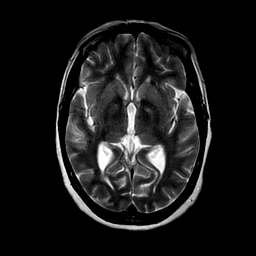
[im 24/24]
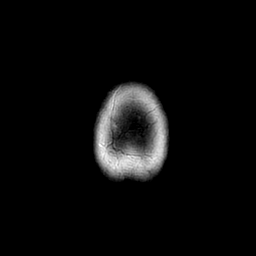

[Series 6: FLAIR · axial · 3.0mm · 0.86mm/px · z∈[-81,+56]mm · 3 of 24 slices shown]
[im 1/24]
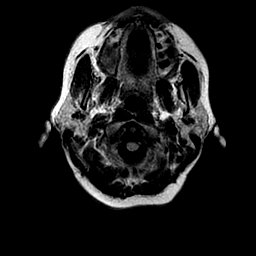
[im 12/24]
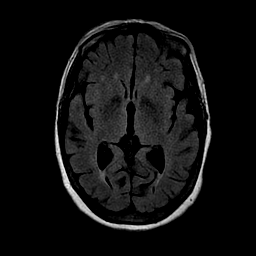
[im 24/24]
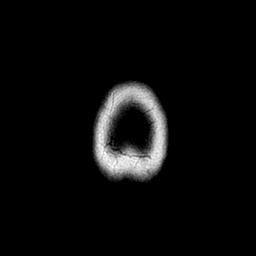

[Series 7: ax mpgr · axial · 5.0mm · 0.43mm/px · z∈[-92,+67]mm · 3 of 24 slices shown]
[im 1/24]
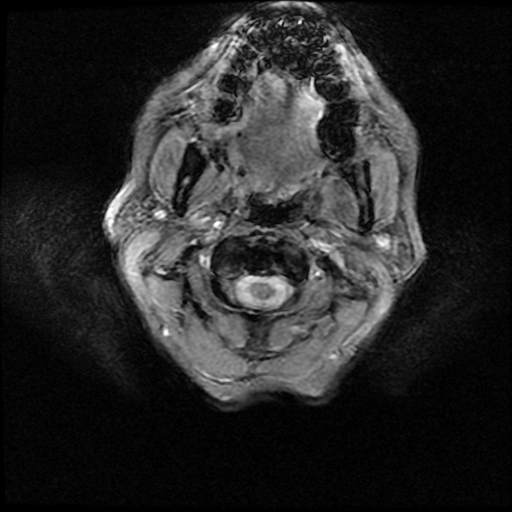
[im 12/24]
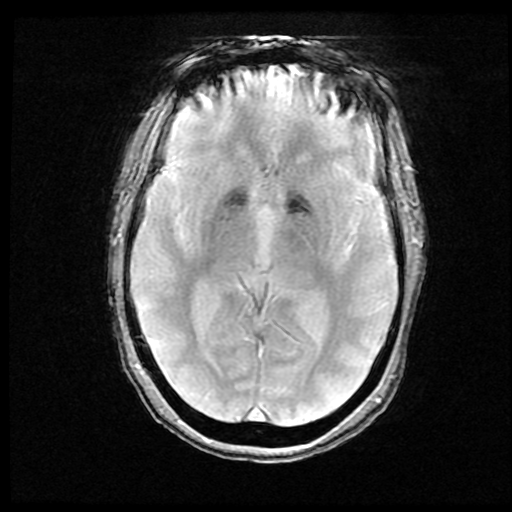
[im 24/24]
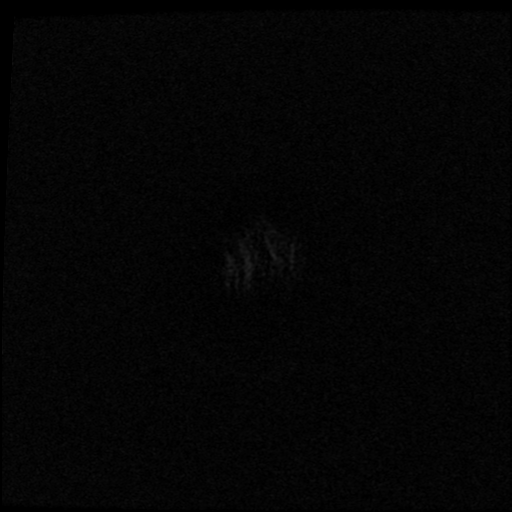

[Series 8: ax fspgr irp · axial · 3.0mm · 0.47mm/px · z∈[-96,-30]mm · 3 of 56 slices shown]
[im 1/56]
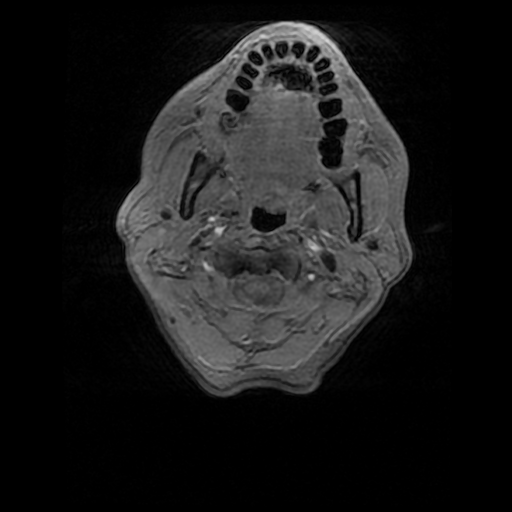
[im 12/56]
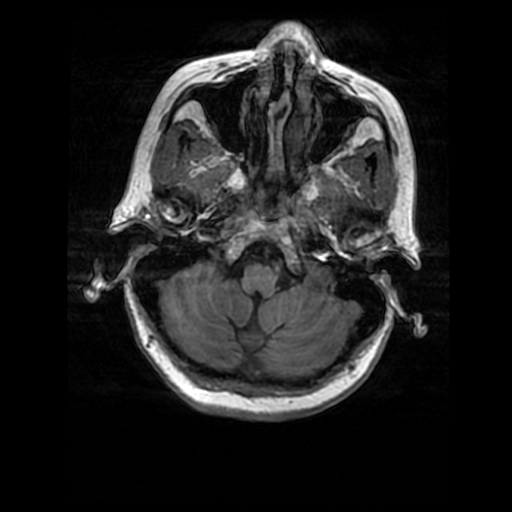
[im 23/56]
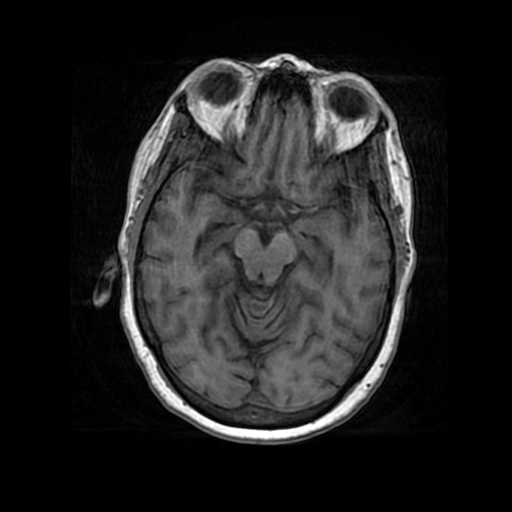

[Series 9: DWI · coronal · 4.0mm · 1.09mm/px · 9 of 82 slices shown (2 of 4)]
[im 1/82]
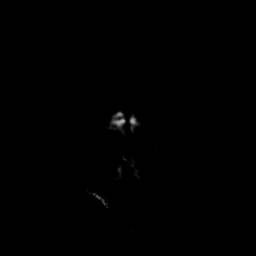
[im 11/82]
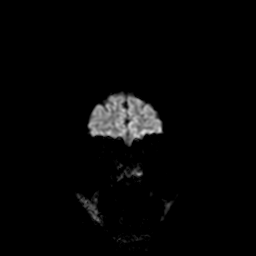
[im 21/82]
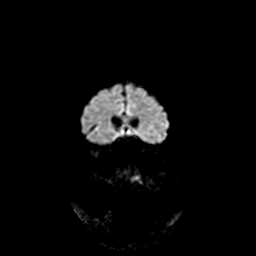
[im 31/82]
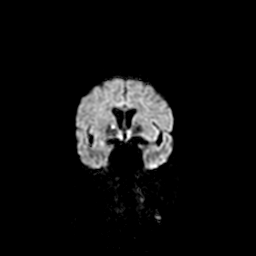
[im 41/82]
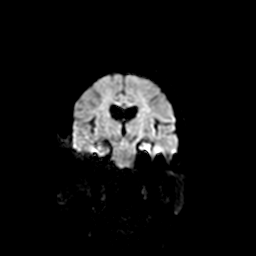
[im 51/82]
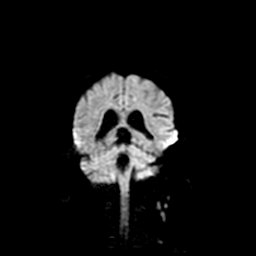
[im 61/82]
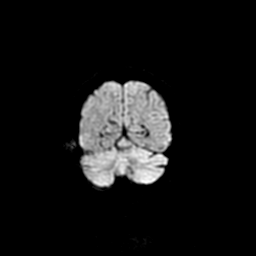
[im 71/82]
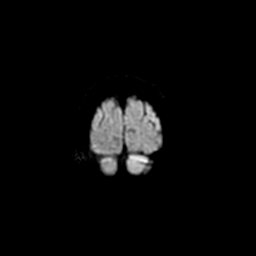
[im 82/82]
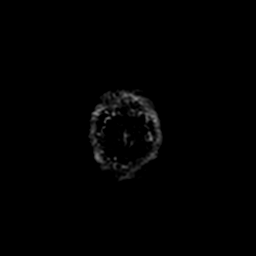

[Series 10: T2 · coronal · 5.0mm · 0.90mm/px · 3 of 26 slices shown (2 of 2)]
[im 1/26]
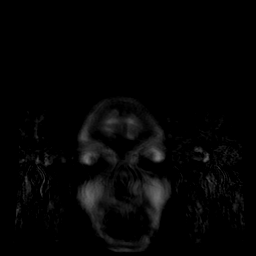
[im 13/26]
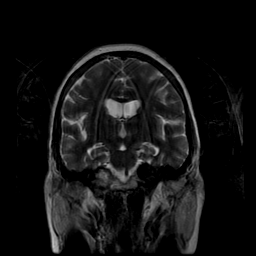
[im 26/26]
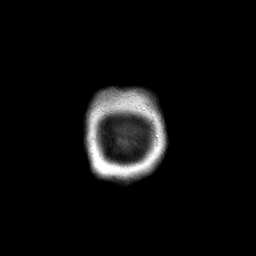

[Series 400: DWI · axial · 3.0mm · 1.09mm/px · z∈[-87,+53]mm · 5 of 48 slices shown (3 of 4)]
[im 1/48]
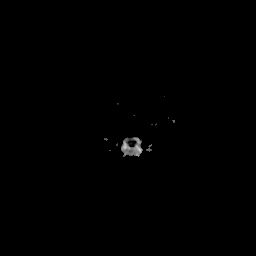
[im 12/48]
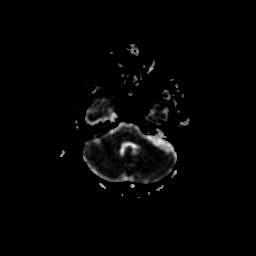
[im 24/48]
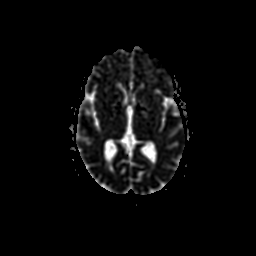
[im 36/48]
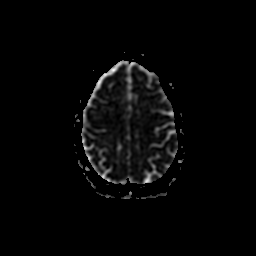
[im 48/48]
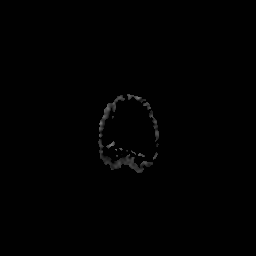

[Series 900: DWI · coronal · 4.0mm · 1.09mm/px · 4 of 41 slices shown (4 of 4)]
[im 1/41]
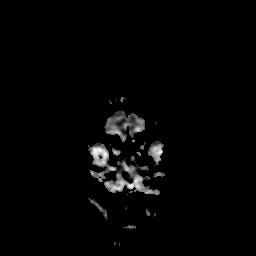
[im 14/41]
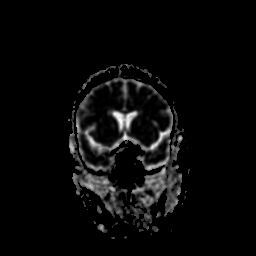
[im 27/41]
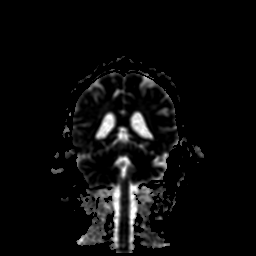
[im 41/41]
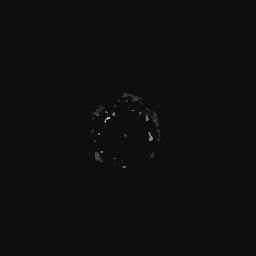

[43 of 48 positions shown; findings below may reference images not displayed]

FINDINGS: Brain: 5 mm focus of reduced diffusion within the right globus
pallidus with increased T2 signal (series 4, image 25 and series 9,
image 26). No associated hemorrhage or mass effect.

Several nonspecific T2 FLAIR hyperintensities in subcortical and
periventricular white matter are compatible with mild chronic
microvascular ischemic changes for age. Mild volume loss of the
brain. No extra-axial collection, hydrocephalus, mass effect, or
herniation.

Vascular: Normal flow voids.

Skull and upper cervical spine: Normal marrow signal.

Sinuses/Orbits: Mild mucosal thickening of the paranasal sinuses. No
abnormal signal of the mastoid air cells. Orbits are unremarkable.

Other: None.
IMPRESSION: 1. 5 mm focus of reduced diffusion within the right globus pallidus
probably representing an acute/early subacute lacunar infarction. No
associated hemorrhage or mass effect.
2. Mild chronic microvascular ischemic changes and volume loss of
the brain.

These results will be called to the ordering clinician or
representative by the Radiologist Assistant, and communication
documented in the PACS or zVision Dashboard.
